# Patient Record
Sex: Female | Born: 1935 | Race: White | Hispanic: No | Smoking: Never smoker
Health system: Southern US, Community
[De-identification: ages and names within clinical notes are randomized; demographics above are authoritative.]

## PROBLEM LIST (undated history)

## (undated) DIAGNOSIS — K219 Gastro-esophageal reflux disease without esophagitis: Secondary | ICD-10-CM

## (undated) DIAGNOSIS — C801 Malignant (primary) neoplasm, unspecified: Secondary | ICD-10-CM

## (undated) DIAGNOSIS — F329 Major depressive disorder, single episode, unspecified: Secondary | ICD-10-CM

## (undated) DIAGNOSIS — J690 Pneumonitis due to inhalation of food and vomit: Secondary | ICD-10-CM

## (undated) DIAGNOSIS — I509 Heart failure, unspecified: Secondary | ICD-10-CM

## (undated) DIAGNOSIS — N390 Urinary tract infection, site not specified: Secondary | ICD-10-CM

## (undated) DIAGNOSIS — F32A Depression, unspecified: Secondary | ICD-10-CM

## (undated) DIAGNOSIS — C50919 Malignant neoplasm of unspecified site of unspecified female breast: Secondary | ICD-10-CM

## (undated) DIAGNOSIS — E785 Hyperlipidemia, unspecified: Secondary | ICD-10-CM

## (undated) DIAGNOSIS — E119 Type 2 diabetes mellitus without complications: Secondary | ICD-10-CM

## (undated) DIAGNOSIS — I10 Essential (primary) hypertension: Secondary | ICD-10-CM

## (undated) HISTORY — DX: Type 2 diabetes mellitus without complications: E11.9

## (undated) HISTORY — DX: Hyperlipidemia, unspecified: E78.5

## (undated) HISTORY — DX: Essential (primary) hypertension: I10

## (undated) HISTORY — DX: Depression, unspecified: F32.A

## (undated) HISTORY — DX: Major depressive disorder, single episode, unspecified: F32.9

## (undated) HISTORY — PX: KIDNEY STONE SURGERY: SHX686

## (undated) HISTORY — DX: Gastro-esophageal reflux disease without esophagitis: K21.9

## (undated) HISTORY — DX: Malignant neoplasm of unspecified site of unspecified female breast: C50.919

## (undated) HISTORY — PX: MASTECTOMY: SHX3

## (undated) HISTORY — PX: BREAST SURGERY: SHX581

## (undated) HISTORY — PX: CARDIAC VALVE REPLACEMENT: SHX585

## (undated) HISTORY — PX: WISDOM TOOTH EXTRACTION: SHX21

## (undated) HISTORY — DX: Urinary tract infection, site not specified: N39.0

## (undated) HISTORY — DX: Heart failure, unspecified: I50.9

## (undated) HISTORY — DX: Malignant (primary) neoplasm, unspecified: C80.1

## (undated) HISTORY — PX: BREAST BIOPSY: SHX20

---

## 2000-08-07 DIAGNOSIS — C50919 Malignant neoplasm of unspecified site of unspecified female breast: Secondary | ICD-10-CM

## 2000-08-07 HISTORY — DX: Malignant neoplasm of unspecified site of unspecified female breast: C50.919

## 2014-12-26 DIAGNOSIS — E1159 Type 2 diabetes mellitus with other circulatory complications: Secondary | ICD-10-CM | POA: Insufficient documentation

## 2015-01-05 ENCOUNTER — Encounter: Payer: Self-pay | Admitting: Internal Medicine

## 2015-01-05 ENCOUNTER — Non-Acute Institutional Stay (SKILLED_NURSING_FACILITY): Payer: Medicare Other | Admitting: Internal Medicine

## 2015-01-05 DIAGNOSIS — F329 Major depressive disorder, single episode, unspecified: Secondary | ICD-10-CM

## 2015-01-05 DIAGNOSIS — N390 Urinary tract infection, site not specified: Secondary | ICD-10-CM | POA: Insufficient documentation

## 2015-01-05 DIAGNOSIS — R296 Repeated falls: Secondary | ICD-10-CM | POA: Diagnosis not present

## 2015-01-05 DIAGNOSIS — B372 Candidiasis of skin and nail: Secondary | ICD-10-CM | POA: Insufficient documentation

## 2015-01-05 DIAGNOSIS — C50919 Malignant neoplasm of unspecified site of unspecified female breast: Secondary | ICD-10-CM | POA: Insufficient documentation

## 2015-01-05 DIAGNOSIS — R42 Dizziness and giddiness: Secondary | ICD-10-CM

## 2015-01-05 DIAGNOSIS — E785 Hyperlipidemia, unspecified: Secondary | ICD-10-CM

## 2015-01-05 DIAGNOSIS — F32A Depression, unspecified: Secondary | ICD-10-CM

## 2015-01-05 DIAGNOSIS — I10 Essential (primary) hypertension: Secondary | ICD-10-CM | POA: Diagnosis not present

## 2015-01-05 DIAGNOSIS — E119 Type 2 diabetes mellitus without complications: Secondary | ICD-10-CM

## 2015-01-05 NOTE — Assessment & Plan Note (Signed)
No info known other than above;pt on arimidex

## 2015-01-05 NOTE — Assessment & Plan Note (Signed)
Was w/u at [facility] CT head-all neg and resolved

## 2015-01-05 NOTE — Progress Notes (Signed)
MRN: [id number] Name: Vickie Murphy  Sex: female Age: 81 y.o. DOB: February 16, 1933  [place] [facility]: [name] Facility/Room:103 Level Of Care: SNF Provider: Inocencio Homes D Emergency Contacts: No emergency contact information on file.  Code Status: DNR  Allergies: Review of patient's allergies indicates not on file.  Chief Complaint  Patient presents with  . New Admit To SNF    HPI: Patient is 81 y.o. female who is admitted to [facility] for generalized weakness after being hosp for UTI.  Past Medical History  Diagnosis Date  . Diabetes mellitus without complication   . Depression   . Hypertension   . Recurrent UTI   . Cancer     breast-s/p mastectomy and on chemo  . Hyperlipidemia     Past Surgical History  Procedure Laterality Date  . Cardiac valve replacement      aortic, years ago-on no anticoagulation      Medication List       This list is accurate as of: 01/05/15  2:28 PM.  Always use your most recent med list.               anastrozole 1 MG tablet  Commonly known as:  ARIMIDEX  Take 1 mg by mouth daily.     atorvastatin 10 MG tablet  Commonly known as:  LIPITOR  Take 10 mg by mouth daily.     Calcium Carb-Cholecalciferol 600-400 MG-UNIT Caps  Take 1 capsule by mouth 2 (two) times daily.     ciprofloxacin 250 MG tablet  Commonly known as:  CIPRO  Take 250 mg by mouth 2 (two) times daily. For 10 days     Cyanocobalamin 1000 MCG/15ML Liqd  Take 1,000 mcg by mouth daily.     diltiazem 120 MG 24 hr capsule  Commonly known as:  CARDIZEM CD  Take 120 mg by mouth daily.     docusate sodium 100 MG capsule  Commonly known as:  COLACE  Take 100 mg by mouth daily as needed for mild constipation.     ENLYTE PO  Take 1 capsule by mouth daily.     FLUoxetine 40 MG capsule  Commonly known as:  PROZAC  Take 40 mg by mouth daily.     gabapentin 300 MG capsule  Commonly known as:  NEURONTIN  Take 300 mg by mouth 3 (three) times daily.     insulin detemir 100  UNIT/ML injection  Commonly known as:  LEVEMIR  Inject 35 Units into the skin at bedtime.     insulin lispro 100 UNIT/ML injection  Commonly known as:  HUMALOG  Inject into the skin 3 (three) times daily before meals. SSI ac     magnesium oxide 400 MG tablet  Commonly known as:  MAG-OX  Take 400 mg by mouth daily.     metFORMIN 500 MG tablet  Commonly known as:  GLUCOPHAGE  Take 500 mg by mouth 2 (two) times daily with a meal.     metoprolol succinate 25 MG 24 hr tablet  Commonly known as:  TOPROL-XL  Take 25 mg by mouth daily.     mirtazapine 7.5 MG tablet  Commonly known as:  REMERON  Take 7.5 mg by mouth at bedtime.     nystatin cream  Commonly known as:  MYCOSTATIN  Apply 1 application topically 2 (two) times daily.     nystatin powder  Commonly known as:  MYCOSTATIN  Apply topically.        Meds ordered this encounter  Medications  . ciprofloxacin (CIPRO) 250 MG tablet    Sig: Take 250 mg by mouth 2 (two) times daily. For 10 days  . gabapentin (NEURONTIN) 300 MG capsule    Sig: Take 300 mg by mouth 3 (three) times daily.  . insulin detemir (LEVEMIR) 100 UNIT/ML injection    Sig: Inject 35 Units into the skin at bedtime.  . metFORMIN (GLUCOPHAGE) 500 MG tablet    Sig: Take 500 mg by mouth 2 (two) times daily with a meal.  . nystatin cream (MYCOSTATIN)    Sig: Apply 1 application topically 2 (two) times daily.  Marland Kitchen nystatin (MYCOSTATIN) powder    Sig: Apply topically.  Marland Kitchen anastrozole (ARIMIDEX) 1 MG tablet    Sig: Take 1 mg by mouth daily.  Marland Kitchen atorvastatin (LIPITOR) 10 MG tablet    Sig: Take 10 mg by mouth daily.  . Calcium Carb-Cholecalciferol 600-400 MG-UNIT CAPS    Sig: Take 1 capsule by mouth 2 (two) times daily.  Marland Kitchen diltiazem (CARDIZEM CD) 120 MG 24 hr capsule    Sig: Take 120 mg by mouth daily.  . Cyanocobalamin 1000 MCG/15ML LIQD    Sig: Take 1,000 mcg by mouth daily.  Marland Kitchen docusate sodium (COLACE) 100 MG capsule    Sig: Take 100 mg by mouth daily as  needed for mild constipation.  . Dietary Management Product (ENLYTE PO)    Sig: Take 1 capsule by mouth daily.  Marland Kitchen FLUoxetine (PROZAC) 40 MG capsule    Sig: Take 40 mg by mouth daily.  . insulin lispro (HUMALOG) 100 UNIT/ML injection    Sig: Inject into the skin 3 (three) times daily before meals. SSI ac  . magnesium oxide (MAG-OX) 400 MG tablet    Sig: Take 400 mg by mouth daily.  . metoprolol succinate (TOPROL-XL) 25 MG 24 hr tablet    Sig: Take 25 mg by mouth daily.  . mirtazapine (REMERON) 7.5 MG tablet    Sig: Take 7.5 mg by mouth at bedtime.     There is no immunization history on file for this patient.  History  Substance Use Topics  . Smoking status: Never Smoker   . Smokeless tobacco: Not on file  . Alcohol Use: No    Family history is noncontributory    Review of Systems  DATA OBTAINED: from patient, nurse, medical record GENERAL:  no fevers, fatigue, appetite changes SKIN: yeast under pannus EYES: No eye pain, redness, discharge EARS: No earache, tinnitus, change in hearing NOSE: No congestion, drainage or bleeding  MOUTH/THROAT: No mouth or tooth pain, No sore throat RESPIRATORY: No cough, wheezing, SOB CARDIAC: No chest pain, palpitations, lower extremity edema  GI: No abdominal pain, No N/V/D or constipation, No heartburn or reflux  GU: No dysuria, frequency or urgency, or incontinence  MUSCULOSKELETAL: No unrelieved bone/joint pain NEUROLOGIC: No headache, dizziness or focal weakness PSYCHIATRIC: No overt anxiety or sadness, No behavior issue.   Filed Vitals:   01/05/15 1351  BP: 116/59  Pulse: 74  Temp: 97 F (36.1 C)  Resp: 18    Physical Exam  GENERAL APPEARANCE: Alert, conversant,  No acute distress.  SKIN: No diaphoresis  HEAD: Normocephalic, atraumatic  EYES: Conjunctiva/lids clear. Pupils round, reactive. EOMs intact.  EARS: External exam WNL, canals clear. Hearing grossly normal.  NOSE: No deformity or discharge.  MOUTH/THROAT: Lips  w/o lesions  RESPIRATORY: Breathing is even, unlabored. Lung sounds are clear   CARDIOVASCULAR: Heart RRR no murmurs, rubs or gallops. No peripheral edema.  GASTROINTESTINAL: Abdomen is soft, non-tender, not distended w/ normal bowel sounds. GENITOURINARY: Bladder non tender, not distended  MUSCULOSKELETAL: No abnormal joints or musculature NEUROLOGIC:  Cranial nerves 2-12 grossly intact. Moves all extremities  PSYCHIATRIC: Mood and affect appropriate to situation, no behavioral issues  Patient Active Problem List   Diagnosis Date Noted  . Falls frequently 01/05/2015  . Dizziness 01/05/2015  . Candidal intertrigo 01/05/2015  . Diabetes mellitus without complication   . Depression   . Hypertension   . Recurrent UTI   . Cancer of breast   . Hyperlipidemia     CBC No results found for: WBC, RBC, HGB, HCT, PLT, MCV, LYMPHSABS, MONOABS, EOSABS, BASOSABS  CMP  No results found for: NA, K, CL, CO2, GLUCOSE, BUN, CREATININE, CALCIUM, PROT, ALBUMIN, AST, ALT, ALKPHOS, BILITOT, GFRNONAA, GFRAA  Assessment and Plan  Recurrent UTI Presentation to [facility] - grew out polymorph; pt tx with rocephin for week and cipro as propylaxis   Depression Was hosp [facility] several weeks priot to admission and sx reported to be better;on prozac and remeron   Candidal intertrigo Nystatin cream and powder   Hypertension Continue cardizem and toprol as per home   Diabetes mellitus without complication Pt not stated as un controlled on detemir, lispro SS and glucophage 500 mg BID- cr 0.8   Hyperlipidemia Cont lipitor 10 mg   Dizziness Was w/u at hospital inc CT head-all neg and resolved   Cancer of breast No info known other than above;pt on arimidex     [name], MD

## 2015-01-05 NOTE — Assessment & Plan Note (Signed)
Continue cardizem and toprol as per home

## 2015-01-05 NOTE — Assessment & Plan Note (Signed)
Cont lipitor 10 mg

## 2015-01-05 NOTE — Assessment & Plan Note (Addendum)
Was hosp [facility] several weeks priot to admission and sx reported to be better;on prozac and remeron

## 2015-01-05 NOTE — Assessment & Plan Note (Addendum)
Pt not stated as un controlled on detemir, lispro SS and glucophage 500 mg BID- cr 0.8

## 2015-01-05 NOTE — Assessment & Plan Note (Signed)
Nystatin cream and powder

## 2015-01-05 NOTE — Assessment & Plan Note (Signed)
Presentation to [facility] - grew out polymorph; pt tx with rocephin for week and cipro as propylaxis

## 2015-01-19 ENCOUNTER — Non-Acute Institutional Stay (SKILLED_NURSING_FACILITY): Payer: Medicare Other | Admitting: Internal Medicine

## 2015-01-19 ENCOUNTER — Other Ambulatory Visit: Payer: Self-pay | Admitting: Internal Medicine

## 2015-01-19 ENCOUNTER — Encounter: Payer: Self-pay | Admitting: Internal Medicine

## 2015-01-19 DIAGNOSIS — R296 Repeated falls: Secondary | ICD-10-CM | POA: Diagnosis not present

## 2015-01-19 DIAGNOSIS — E785 Hyperlipidemia, unspecified: Secondary | ICD-10-CM | POA: Diagnosis not present

## 2015-01-19 DIAGNOSIS — I10 Essential (primary) hypertension: Secondary | ICD-10-CM | POA: Diagnosis not present

## 2015-01-19 DIAGNOSIS — F329 Major depressive disorder, single episode, unspecified: Secondary | ICD-10-CM | POA: Diagnosis not present

## 2015-01-19 DIAGNOSIS — B372 Candidiasis of skin and nail: Secondary | ICD-10-CM | POA: Diagnosis not present

## 2015-01-19 DIAGNOSIS — N390 Urinary tract infection, site not specified: Secondary | ICD-10-CM | POA: Diagnosis not present

## 2015-01-19 DIAGNOSIS — E119 Type 2 diabetes mellitus without complications: Secondary | ICD-10-CM | POA: Diagnosis not present

## 2015-01-19 DIAGNOSIS — F32A Depression, unspecified: Secondary | ICD-10-CM

## 2015-01-19 DIAGNOSIS — C50911 Malignant neoplasm of unspecified site of right female breast: Secondary | ICD-10-CM

## 2015-01-19 NOTE — Progress Notes (Signed)
MRN: [id number] Name: Vickie Murphy  Sex: female Age: 81 y.o. DOB: 09-08-1932  [place] #: [name] Facility/Room:103 Level Of Care: SNF Provider: Inocencio Homes D Emergency Contacts: Extended Emergency Contact Information Primary Emergency Contact: [name],[name] Address: [address]          [place], [place] [address] [place] Phone: [phone] Relation: Friend Secondary Emergency Contact: [name],[name]          [place], [place] Mobile Phone: [phone] Relation: Daughter  Code Status:DNR   Allergies: Review of patient's allergies indicates no known allergies.  Chief Complaint  Patient presents with  . Discharge Note    HPI: Patient is 81 y.o. female who was admitted to SNF for generalized weakness after a UTI who is now ready to go home  Past Medical History  Diagnosis Date  . Diabetes mellitus without complication   . Depression   . Hypertension   . Recurrent UTI   . Cancer     breast-s/p mastectomy and on chemo  . Hyperlipidemia     Past Surgical History  Procedure Laterality Date  . Cardiac valve replacement      aortic, years ago-on no anticoagulation      Medication List       This list is accurate as of: 01/19/15 12:38 PM.  Always use your most recent med list.               anastrozole 1 MG tablet  Commonly known as:  ARIMIDEX  Take 1 mg by mouth daily.     atorvastatin 10 MG tablet  Commonly known as:  LIPITOR  Take 10 mg by mouth daily.     Calcium Carb-Cholecalciferol 600-400 MG-UNIT Caps  Take 1 capsule by mouth 2 (two) times daily.     Cyanocobalamin 1000 MCG/15ML Liqd  Take 1,000 mcg by mouth daily.     diltiazem 120 MG 24 hr capsule  Commonly known as:  CARDIZEM CD  Take 120 mg by mouth daily.     docusate sodium 100 MG capsule  Commonly known as:  COLACE  Take 100 mg by mouth daily as needed for mild constipation.     ENLYTE PO  Take 1 capsule by mouth daily.     FLUoxetine 40 MG  capsule  Commonly known as:  PROZAC  Take 40 mg by mouth daily.     gabapentin 300 MG capsule  Commonly known as:  NEURONTIN  Take 300 mg by mouth 3 (three) times daily.     insulin detemir 100 UNIT/ML injection  Commonly known as:  LEVEMIR  Inject 35 Units into the skin at bedtime.     insulin lispro 100 UNIT/ML injection  Commonly known as:  HUMALOG  Inject 16 Units into the skin 3 (three) times daily before meals. SSI ac for BS > 200     magnesium oxide 400 MG tablet  Commonly known as:  MAG-OX  Take 400 mg by mouth daily.     metFORMIN 500 MG tablet  Commonly known as:  GLUCOPHAGE  Take 500 mg by mouth 2 (two) times daily with a meal.     metoprolol succinate 25 MG 24 hr tablet  Commonly known as:  TOPROL-XL  Take 25 mg by mouth daily.     mirtazapine 7.5 MG tablet  Commonly known as:  REMERON  Take 7.5 mg by mouth at bedtime.     nystatin cream  Commonly known as:  MYCOSTATIN  Apply 1 application topically 2 (two) times daily.     nystatin powder  Commonly known as:  MYCOSTATIN  Apply topically.        No orders of the defined types were placed in this encounter.     There is no immunization history on file for this patient.  History  Substance Use Topics  . Smoking status: Never Smoker   . Smokeless tobacco: Not on file  . Alcohol Use: No    Filed Vitals:   01/19/15 1220  BP: 116/72  Pulse: 85  Temp: 97.1 F (36.2 C)  Resp: 18    Physical Exam  GENERAL APPEARANCE: Alert, conversant. No acute distress.  HEENT: Unremarkable. RESPIRATORY: Breathing is even, unlabored. Lung sounds are clear   CARDIOVASCULAR: Heart RRR no murmurs, rubs or gallops. No peripheral edema.  GASTROINTESTINAL: Abdomen is soft, non-tender, not distended w/ normal bowel sounds.  NEUROLOGIC: Cranial nerves 2-12 grossly intact. Moves all extremities  Patient Active Problem List   Diagnosis Date Noted  . Falls frequently 01/05/2015  . Dizziness 01/05/2015  . Candidal  intertrigo 01/05/2015  . Diabetes mellitus without complication   . Depression   . Hypertension   . Recurrent UTI   . Cancer of breast   . Hyperlipidemia         Assessment and Plan  Pt is stable for d/c to home with HH/OT/PT and nursing.Pt has candida intertrigo in abdominal folds being treated with nystatin cream and powder.  [name], MD

## 2015-02-15 ENCOUNTER — Other Ambulatory Visit: Payer: Self-pay | Admitting: Internal Medicine

## 2015-02-18 ENCOUNTER — Other Ambulatory Visit: Payer: Self-pay | Admitting: Internal Medicine

## 2015-03-18 ENCOUNTER — Other Ambulatory Visit: Payer: Self-pay | Admitting: Internal Medicine

## 2015-03-29 ENCOUNTER — Other Ambulatory Visit: Payer: Self-pay | Admitting: Internal Medicine

## 2015-05-27 ENCOUNTER — Non-Acute Institutional Stay (SKILLED_NURSING_FACILITY): Payer: Medicare Other | Admitting: Internal Medicine

## 2015-05-27 DIAGNOSIS — F329 Major depressive disorder, single episode, unspecified: Secondary | ICD-10-CM

## 2015-05-27 DIAGNOSIS — E119 Type 2 diabetes mellitus without complications: Secondary | ICD-10-CM | POA: Diagnosis not present

## 2015-05-27 DIAGNOSIS — N390 Urinary tract infection, site not specified: Secondary | ICD-10-CM | POA: Diagnosis not present

## 2015-05-27 DIAGNOSIS — I10 Essential (primary) hypertension: Secondary | ICD-10-CM | POA: Diagnosis not present

## 2015-05-27 DIAGNOSIS — F32A Depression, unspecified: Secondary | ICD-10-CM

## 2015-05-27 NOTE — Progress Notes (Signed)
Patient ID: Vickie Murphy, female   DOB: [birth year], 81 y.o.   MRN: [id number]  intertrigo  This is an acute visit.  Level care skilled.  [facility].   chief complaint-acute visit status post hospitalization for UTI with recurrent infections.  History of present illness.   patient is a pleasant 81 year old female presented to the ER with complaints of generalized weakness - with decreased by mouth intake nausea.   she was admitted to the hospital UTI in a nonhealing right groin ulcer -she initially had IV antibiotics - urine was sent for culture.   -Regards to nonhealing right groin ulcer she had a biopsy done about 10 days before hospital admission.  Initially her urine culture grew out Citrobacter was sensitive to Levaquin she was also started on Diflucan as well as vancomycin for the right groin wound growing gram-positive bacilli.   Patient also was treated for chronic Candida intertrigo   dermatology has ecommended  Domeboros wet-to-dry for her groin wound.   She is also on Nizoral cream.   Regards to UTI she continues on Levaquin this is also apparently for the groin wound   previous medical history.  UTI.   chronic groin wound.   type 2 diabetes.  Hypertension.   History of candidiasis.   Depression.   surgical history.  Previous history of valve replacement.  Social history-she did live in an independent living  apartment-she is a widow.  No history of smoing alcohol or illicit drug use.   family history no pertinent family history.   medications.   Nizoral cream twice a day.  Levaquin 500 mg for total of 10 days.  Magnesium oxide 400 mg twice a day.  Remeron 15 mg daily.   Cartia XT 120 mg daily.   vitamin B12 thousand micrograms daily.  Prozac 40 mg daily.  Folic acid 40 mg daily.  Neurontin 300 mg 3 times a day.  Imodium 4 times a day when necessary.  Glucophage 500 mg 3 times a day.   Toprol-XL 5 mg daily.  Multivitamin  daily.   trazodone 50 mg daily at bedtime.  Vitamin C thousand milligrams daily.  Review of systems.   in general does not complain of any fever or chills.  Skin again does have issues as noted above including a groin wound and history candidiasis.  Eyes does not complain of any visual changes for eye pain.  Ear nose mouth and throat does not complain a sore throat or nasal discharge.   Respiratory not complaining of shortness breath is at times in the afternoon she will get a bit short of breath that is very transitory she says this is not new currently does not have shortness of breath.   Cardiac does not complaining chest pain or palpitations.   GI does not complain of nausea vomiting diarrhea or constipation.  GU is not complaining of dysuria currently being treated for UTI.  Muscle skeletal has weakness but does not complaining of joint pain.  Neurologic not complaining of dizziness or headache appears have some history of neuropathy on Neurontin.   Psych history depression  Does not complain of feeling sad or depressed currently.  Physical exam.  Temperature is 97.8 pulse 86 respirations 20 blood pressure 117/77.   In general this is a very pleasant elderly female in no distress lying comfortably in bed.  Her skin is warm and dry she does have dressing over her groin wound cream applied to the groin area.  Eyes pupils  appear reactive light visual acuity appears grossly intact sclera and conjunctivae clear.   Oropharynx mucous membranes are  Moist throat is clear.  Chest is clear to auscultation no labored breathing.  Heart is regular rate and rhythm without murmur gallop or rub she does not have significant lower extremity edema pedal pulses are intact.  Abdomen soft nontender positive bowel sounds.  Muscle skeletal has generalized weakness but I do not note any deformities is able to move all her extremities 4.  Neurologic is grossly intact no lateralizing  findings-- Her speech is clear.  Psych she is alert and oriented very pleasant and appropriate.  Labs.   05/24/2015.    Sodium 136 potassium 4.3 BUN 24 creatinine 0.84.  WBC 8.3 hemoglobin 10.8 platelets 2:30.   assessment and plan.   History of UTI Citro Dr. She is completing a ten-day course of Levaquin at this point appears to be stable she is afebrile continue to monitor   #2 history of chronic groin wound-again this has been biopsied recommendation forDomeboros-wet-to-dry compresses by dermatology she also continus on Levaquin.   she will be followed by wound care here as well.  Also recommendation for Nizoral cream to her area with history of chronic candidiasis.   #3 hypertension-this appears to be stable continue to monitor blood pressure she is  Cartia  120  Milligrams per day.   #4- depression this appears to be quite stable at this point she is on Prozac as well as Remeron-this may be for appetite stimulation apparently her appetite was quite poor previously.   #5 history oof diabetes type 2 continues on Glucophage blood sugars her chart review were stable in hospital at this point monitor she appears to be doing well with this.   History of diarrhea as an outpatient previously she is on Imodium as needed is not complaining of diarrhea this evening.   will update a CBC and metabolic panel first lab next week.   [id number]- of note greater than 40 minutes spent assessing patient reviewing her chart-and coordinating and formulating a plan of care for numerous diagnoses- of note greater than 50% of time spent coordinating plan of care         .

## 2015-06-01 ENCOUNTER — Non-Acute Institutional Stay (SKILLED_NURSING_FACILITY): Payer: Medicare Other | Admitting: Internal Medicine

## 2015-06-01 ENCOUNTER — Encounter: Payer: Self-pay | Admitting: Internal Medicine

## 2015-06-01 DIAGNOSIS — R296 Repeated falls: Secondary | ICD-10-CM

## 2015-06-01 DIAGNOSIS — I10 Essential (primary) hypertension: Secondary | ICD-10-CM | POA: Diagnosis not present

## 2015-06-01 DIAGNOSIS — E119 Type 2 diabetes mellitus without complications: Secondary | ICD-10-CM

## 2015-06-01 DIAGNOSIS — L98499 Non-pressure chronic ulcer of skin of other sites with unspecified severity: Secondary | ICD-10-CM | POA: Diagnosis not present

## 2015-06-01 DIAGNOSIS — F32A Depression, unspecified: Secondary | ICD-10-CM

## 2015-06-01 DIAGNOSIS — N390 Urinary tract infection, site not specified: Secondary | ICD-10-CM | POA: Diagnosis not present

## 2015-06-01 DIAGNOSIS — B372 Candidiasis of skin and nail: Secondary | ICD-10-CM

## 2015-06-01 DIAGNOSIS — F329 Major depressive disorder, single episode, unspecified: Secondary | ICD-10-CM

## 2015-06-01 NOTE — Progress Notes (Signed)
MRN: [id number] Name: Athira Janowicz  Sex: female Age: 81 y.o. DOB: 02/17/1933  [place] [facility]: [name] Facility/Room:103 Level Of Care: SNF Provider: Inocencio Homes D Emergency Contacts: Extended Emergency Contact Information Primary Emergency Contact: [name],[name] Address: [address]          [place], [place] [address] [place] Phone: [phone] Relation: Friend Secondary Emergency Contact: [name],[name]          [place], [place] Mobile Phone: [phone] Relation: Daughter  Code Status:   Allergies: Review of patient's allergies indicates no known allergies.  Chief Complaint  Patient presents with  . New Admit To SNF    HPI: Patient is 81 y.o. female with DM,HTN, depression, frequent falls, recurrent UTI's who was admitted to [facility] 10/12-19 for a UTI and infected R groin ulcer. Pt is being admitted to SNF for generalized weakness and frequent falls and to complete abx course. While at SNF pt wil be followed for HTN, tx with metoprolol and diltiazem, chronic candida intertrigo, tx with nizoral and depression tx with prozac and remeron.  Past Medical History  Diagnosis Date  . Diabetes mellitus without complication ([place])   . Depression   . Hypertension   . Recurrent UTI   . Cancer [facility])     breast-s/p mastectomy and on chemo  . Hyperlipidemia     Past Surgical History  Procedure Laterality Date  . Cardiac valve replacement      aortic, years ago-on no anticoagulation      Medication List       This list is accurate as of: 06/01/15 11:59 PM.  Always use your most recent med list.               anastrozole 1 MG tablet  Commonly known as:  ARIMIDEX  Take 1 mg by mouth daily.     atorvastatin 10 MG tablet  Commonly known as:  LIPITOR  Take 10 mg by mouth daily.     Calcium Carb-Cholecalciferol 600-400 MG-UNIT Caps  Take 1 capsule by mouth 2 (two) times daily.     Cyanocobalamin 1000 MCG/15ML Liqd  Take 1,000 mcg by  mouth daily.     diltiazem 120 MG 24 hr capsule  Commonly known as:  CARDIZEM CD  Take 120 mg by mouth daily.     FLUoxetine 40 MG capsule  Commonly known as:  PROZAC  Take 40 mg by mouth daily.     folic acid 016 MCG tablet  Commonly known as:  FOLVITE  Take 400 mcg by mouth daily.     gabapentin 300 MG capsule  Commonly known as:  NEURONTIN  Take 300 mg by mouth 3 (three) times daily.     insulin detemir 100 UNIT/ML injection  Commonly known as:  LEVEMIR  Inject 35 Units into the skin at bedtime.     insulin lispro 100 UNIT/ML injection  Commonly known as:  HUMALOG  Inject 16 Units into the skin 3 (three) times daily before meals. SSI ac for BS > 200     ketoconazole 2 % cream  Commonly known as:  NIZORAL  Apply 1 application topically 2 (two) times daily.     levofloxacin 500 MG tablet  Commonly known as:  LEVAQUIN  Take 500 mg by mouth daily. For 10 more days     magnesium oxide 400 MG tablet  Commonly known as:  MAG-OX  Take 400 mg by mouth 2 (two) times daily.  metFORMIN 500 MG tablet  Commonly known as:  GLUCOPHAGE  Take 500 mg by mouth 2 (two) times daily with a meal.     metoprolol succinate 25 MG 24 hr tablet  Commonly known as:  TOPROL-XL  Take 25 mg by mouth daily.     mirtazapine 7.5 MG tablet  Commonly known as:  REMERON  Take 15 mg by mouth at bedtime.     multivitamin capsule  Take 1 capsule by mouth daily.     traZODone 50 MG tablet  Commonly known as:  DESYREL  Take 50 mg by mouth at bedtime.     vitamin C 1000 MG tablet  Take 1,000 mg by mouth daily.        Meds ordered this encounter  Medications  . ketoconazole (NIZORAL) 2 % cream    Sig: Apply 1 application topically 2 (two) times daily.  Marland Kitchen levofloxacin (LEVAQUIN) 500 MG tablet    Sig: Take 500 mg by mouth daily. For 10 more days  . Multiple Vitamin (MULTIVITAMIN) capsule    Sig: Take 1 capsule by mouth daily.  . traZODone (DESYREL) 50 MG tablet    Sig: Take 50 mg by  mouth at bedtime.  . Ascorbic Acid (VITAMIN C) 1000 MG tablet    Sig: Take 1,000 mg by mouth daily.  . folic acid (FOLVITE) 716 MCG tablet    Sig: Take 400 mcg by mouth daily.     There is no immunization history on file for this patient.  Social History  Substance Use Topics  . Smoking status: Never Smoker   . Smokeless tobacco: Not on file  . Alcohol Use: No    Family history is  + HTN  Review of Systems  DATA OBTAINED: from patient, nurse- no c/o or concerns GENERAL:  no fevers, fatigue, appetite changes SKIN: No itching, rash or wounds EYES: No eye pain, redness, discharge EARS: No earache, tinnitus, change in hearing NOSE: No congestion, drainage or bleeding  MOUTH/THROAT: No mouth or tooth pain, No sore throat RESPIRATORY: No cough, wheezing, SOB CARDIAC: No chest pain, palpitations, lower extremity edema  GI: No abdominal pain, No N/V/D or constipation, No heartburn or reflux  GU: No dysuria, frequency or urgency, or incontinence  MUSCULOSKELETAL: No unrelieved bone/joint pain NEUROLOGIC: No headache, dizziness or focal weakness PSYCHIATRIC: No c/o anxiety or sadness   Filed Vitals:   06/01/15 1617  BP: 123/67  Pulse: 83  Temp: 97.3 F (36.3 C)  Resp: 20    SpO2 Readings from Last 1 Encounters:  No data found for SpO2        Physical Exam  GENERAL APPEARANCE: Alert, conversant,  Pleasant WF,No acute distress.  SKIN: No diaphoresis rash; dressing r groin HEAD: Normocephalic, atraumatic  EYES: Conjunctiva/lids clear. Pupils round, reactive. EOMs intact.  EARS: External exam WNL, canals clear. Hearing grossly normal.  NOSE: No deformity or discharge.  MOUTH/THROAT: Lips w/o lesions  RESPIRATORY: Breathing is even, unlabored. Lung sounds are clear   CARDIOVASCULAR: Heart RRR no murmurs, rubs or gallops. No peripheral edema.   GASTROINTESTINAL: Abdomen is soft, non-tender, not distended w/ normal bowel sounds. GENITOURINARY: Bladder non tender, not  distended  MUSCULOSKELETAL: No abnormal joints or musculature NEUROLOGIC:  Cranial nerves 2-12 grossly intact. Moves all extremities  PSYCHIATRIC: Mood and affect appropriate to situation, no behavioral issues  Patient Active Problem List   Diagnosis Date Noted  . Ulcer of right groin ([place]) 06/02/2015  . Falls frequently 01/05/2015  . Dizziness 01/05/2015  .  Candidal intertrigo 01/05/2015  . Diabetes mellitus without complication ([place])   . Depression   . Hypertension   . Recurrent UTI   . Cancer of breast ([place])   . Hyperlipidemia     CBC No results found for: WBC, RBC, HGB, HCT, PLT, MCV, LYMPHSABS, MONOABS, EOSABS, BASOSABS  CMP  No results found for: NA, K, CL, CO2, GLUCOSE, BUN, CREATININE, CALCIUM, PROT, ALBUMIN, AST, ALT, ALKPHOS, BILITOT, GFRNONAA, GFRAA  No results found for: HGBA1C  Not all labs, radiology exams or other studies done during hospitalization come through on my EPIC note; however they are reviewed by me.Outside hospital info is on paper and are reviewed by me.    Assessment and Plan  Recurrent UTI Pt was tx with rocephin and changed to levaquin when wound cx returned ; SNF - levaquin 500 mg for 10 more days  Ulcer of right groin ([place]) Non healing'bx taken at [facility] 2 weeks ago returned citrobacter sens to levaquin; SNF - wound care -d[place]boro wet to dry and levaquin for 10 more days  Candidal intertrigo SNF - chronic;has been changed to nizorale cream  Hypertension SNF - stable on metoprolol and diltiazem  Falls frequently SNF - OT/PT  Depression SNF - stable on prozac 40 mg, remeron 15 mg ;plan - cont  Diabetes mellitus without complication SNF - pt was on insulin prior but comes to [place] with only glucophage; not stated why in notes and pt doesn't know; no longer on statin   Time spent 45 min;> 50% of time with patient was spent reviewing records, labs, tests and studies, counseling and developing plan of care  [name],  MD

## 2015-06-02 DIAGNOSIS — L98499 Non-pressure chronic ulcer of skin of other sites with unspecified severity: Secondary | ICD-10-CM | POA: Insufficient documentation

## 2015-06-02 NOTE — Assessment & Plan Note (Signed)
SNF - OT/PT

## 2015-06-02 NOTE — Assessment & Plan Note (Signed)
SNF - stable on metoprolol and diltiazem

## 2015-06-02 NOTE — Assessment & Plan Note (Signed)
SNF - pt was on insulin prior but comes to [place] with only glucophage; not stated why in notes and pt doesn't know; no longer on statin

## 2015-06-02 NOTE — Assessment & Plan Note (Signed)
SNF - stable on prozac 40 mg, remeron 15 mg ;plan - cont

## 2015-06-02 NOTE — Assessment & Plan Note (Signed)
SNF - chronic;has been changed to nizorale cream

## 2015-06-02 NOTE — Assessment & Plan Note (Signed)
Non healing'bx taken at [facility] 2 weeks ago returned citrobacter sens to levaquin; SNF - wound care -domeboro wet to dry and levaquin for 10 more days

## 2015-06-02 NOTE — Assessment & Plan Note (Signed)
Pt was tx with rocephin and changed to levaquin when wound cx returned ; SNF - levaquin 500 mg for 10 more days

## 2015-06-04 ENCOUNTER — Non-Acute Institutional Stay (SKILLED_NURSING_FACILITY): Payer: Medicare Other | Admitting: Internal Medicine

## 2015-06-04 DIAGNOSIS — R0602 Shortness of breath: Secondary | ICD-10-CM | POA: Diagnosis not present

## 2015-06-05 ENCOUNTER — Encounter: Payer: Self-pay | Admitting: Internal Medicine

## 2015-06-07 ENCOUNTER — Encounter: Payer: Self-pay | Admitting: Internal Medicine

## 2015-06-07 DIAGNOSIS — R0602 Shortness of breath: Secondary | ICD-10-CM | POA: Insufficient documentation

## 2015-06-07 NOTE — Assessment & Plan Note (Signed)
After much further questioning and normal exam except for RR and anxious affect pt admits she is very upset because she is not going to be able to see her granddaughter before she joins the [employer] in 3 days. She became tearful and the daughter filled in the family dynamics; Dx -SOB 2/2 acute emotion and anxiety

## 2015-06-07 NOTE — Progress Notes (Signed)
MRN: [id number] Name: Vickie Murphy  Sex: female Age: 81 y.o. DOB: Oct 13, 1932  [facility] Facility/Room:103 Level Of Care: SNF Provider: Inocencio Homes D Emergency Contacts: Extended Emergency Contact Information Primary Emergency Contact: Vickie Murphy Address: [address]          [place], [place] [address] [place] Phone: [phone] Relation: Friend Secondary Emergency Contact: Vickie Murphy          [place], [place] Mobile Phone: [phone] Relation: Daughter  Code Status:   Allergies: Review of patient's allergies indicates no known allergies.  Chief Complaint  Patient presents with  . Acute Visit    HPI: Patient is 81 y.o. female with DM,HTN, depression, frequent falls, recurrent UTI's who was admitted to [facility] 10/12-19 for a UTI and infected R groin ulcer admitted to SNF for generalized weakness and abx tx who nursing has asked me to see now for sudden SOB. Pt's daughter is in the room and very worried. Pt is having no CP, nausea, arm pain, fever, cold, cough.  Past Medical History  Diagnosis Date  . Diabetes mellitus without complication ([place])   . Depression   . Hypertension   . Recurrent UTI   . Cancer [facility])     breast-s/p mastectomy and on chemo  . Hyperlipidemia     Past Surgical History  Procedure Laterality Date  . Cardiac valve replacement      aortic, years ago-on no anticoagulation      Medication List       This list is accurate as of: 06/04/15 11:59 PM.  Always use your most recent med list.               anastrozole 1 MG tablet  Commonly known as:  ARIMIDEX  Take 1 mg by mouth daily.     atorvastatin 10 MG tablet  Commonly known as:  LIPITOR  Take 10 mg by mouth daily.     Calcium Carb-Cholecalciferol 600-400 MG-UNIT Caps  Take 1 capsule by mouth 2 (two) times daily.     Cyanocobalamin 1000 MCG/15ML Liqd  Take 1,000 mcg by mouth daily.     diltiazem 120 MG 24 hr capsule   Commonly known as:  CARDIZEM CD  Take 120 mg by mouth daily.     FLUoxetine 40 MG capsule  Commonly known as:  PROZAC  Take 40 mg by mouth daily.     folic acid 242 MCG tablet  Commonly known as:  FOLVITE  Take 400 mcg by mouth daily.     gabapentin 300 MG capsule  Commonly known as:  NEURONTIN  Take 300 mg by mouth 3 (three) times daily.     insulin detemir 100 UNIT/ML injection  Commonly known as:  LEVEMIR  Inject 35 Units into the skin at bedtime.     insulin lispro 100 UNIT/ML injection  Commonly known as:  HUMALOG  Inject 16 Units into the skin 3 (three) times daily before meals. SSI ac for BS > 200     ketoconazole 2 % cream  Commonly known as:  NIZORAL  Apply 1 application topically 2 (two) times daily.     levofloxacin 500 MG tablet  Commonly known as:  LEVAQUIN  Take 500 mg by mouth daily. For 10 more days     magnesium oxide 400 MG tablet  Commonly known as:  MAG-OX  Take 400 mg by mouth 2 (two) times daily.     metFORMIN 500 MG tablet  Commonly known as:  GLUCOPHAGE  Take 500 mg by mouth 2 (two) times daily with a meal.     metoprolol succinate 25 MG 24 hr tablet  Commonly known as:  TOPROL-XL  Take 25 mg by mouth daily.     mirtazapine 7.5 MG tablet  Commonly known as:  REMERON  Take 15 mg by mouth at bedtime.     multivitamin capsule  Take 1 capsule by mouth daily.     traZODone 50 MG tablet  Commonly known as:  DESYREL  Take 50 mg by mouth at bedtime.     vitamin C 1000 MG tablet  Take 1,000 mg by mouth daily.        No orders of the defined types were placed in this encounter.     There is no immunization history on file for this patient.  Social History  Substance Use Topics  . Smoking status: Never Smoker   . Smokeless tobacco: Not on file  . Alcohol Use: No    Review of Systems  DATA OBTAINED: from patient, nurse, daughter GENERAL:  no fevers, fatigue, appetite changes SKIN: No itching, rash HEENT: No  complaint RESPIRATORY: No cough, wheezing, SOB CARDIAC: No chest pain, palpitations, lower extremity edema  GI: No abdominal pain, No N/V/D or constipation, No heartburn or reflux  GU: No dysuria, frequency or urgency, or incontinence  MUSCULOSKELETAL: No unrelieved bone/joint pain NEUROLOGIC: No headache, dizziness  PSYCHIATRIC: +anxiety and sadness  Filed Vitals:   06/07/15 1316  BP: 146/74  Pulse: 66  Temp: 98 F (36.7 C)  Resp: 18    Physical Exam  GENERAL APPEARANCE: Alert, conversant, No acute physical distress  SKIN: No diaphoresis rash,  HEENT: Unremarkable RESPIRATORY: Breathing is even, unlabored. Lung sounds are clear; O2 sat RA 95%   CARDIOVASCULAR: Heart RRR no murmurs, rubs or gallops. No peripheral edema  GASTROINTESTINAL: Abdomen is soft, non-tender, not distended w/ normal bowel sounds.  GENITOURINARY: Bladder non tender, not distended  MUSCULOSKELETAL: No abnormal joints or musculature NEUROLOGIC: Cranial nerves 2-12 grossly intact. Moves all extremities PSYCHIATRIC: pt appears emotionally upset, no behavioral issues  Patient Active Problem List   Diagnosis Date Noted  . SOB (shortness of breath) 06/07/2015  . Ulcer of right groin ([place]) 06/02/2015  . Falls frequently 01/05/2015  . Dizziness 01/05/2015  . Candidal intertrigo 01/05/2015  . Diabetes mellitus without complication ([place])   . Depression   . Hypertension   . Recurrent UTI   . Cancer of breast ([place])   . Hyperlipidemia         Assessment and Plan  SOB (shortness of breath) After much further questioning and normal exam except for RR and anxious affect pt admits she is very upset because she is not going to be able to see her granddaughter before she joins the [employer] in 3 days. She became tearful and the daughter filled in the family dynamics; Dx -SOB 2/2 acute emotion and anxiety   Time spent > 35 min Vickie Duos, MD

## 2015-06-22 ENCOUNTER — Non-Acute Institutional Stay (SKILLED_NURSING_FACILITY): Payer: Medicare Other | Admitting: Internal Medicine

## 2015-06-22 ENCOUNTER — Encounter: Payer: Self-pay | Admitting: Internal Medicine

## 2015-06-22 DIAGNOSIS — L98499 Non-pressure chronic ulcer of skin of other sites with unspecified severity: Secondary | ICD-10-CM | POA: Diagnosis not present

## 2015-06-22 DIAGNOSIS — E119 Type 2 diabetes mellitus without complications: Secondary | ICD-10-CM

## 2015-06-22 DIAGNOSIS — F32A Depression, unspecified: Secondary | ICD-10-CM

## 2015-06-22 DIAGNOSIS — L98479 Non-pressure chronic ulcer of groin with unspecified severity: Secondary | ICD-10-CM

## 2015-06-22 DIAGNOSIS — B372 Candidiasis of skin and nail: Secondary | ICD-10-CM | POA: Diagnosis not present

## 2015-06-22 DIAGNOSIS — F329 Major depressive disorder, single episode, unspecified: Secondary | ICD-10-CM

## 2015-06-22 DIAGNOSIS — N390 Urinary tract infection, site not specified: Secondary | ICD-10-CM | POA: Diagnosis not present

## 2015-06-22 DIAGNOSIS — I10 Essential (primary) hypertension: Secondary | ICD-10-CM | POA: Diagnosis not present

## 2015-06-22 DIAGNOSIS — E785 Hyperlipidemia, unspecified: Secondary | ICD-10-CM

## 2015-06-22 NOTE — Progress Notes (Signed)
MRN: [id number] Name: Vickie Murphy  Sex: female Age: 81 y.o. DOB: [birth year]  [place] [name] Facility/Room:103 Level Of Care: SNF Provider: Inocencio Homes D Emergency Contacts: Extended Emergency Contact Information Primary Emergency Contact: [name],[name] Address: [address]          [place], [place] [address] [place] Phone: [phone] Relation: Friend Secondary Emergency Contact: [name],[name]          [place], [place] Mobile Phone: [phone] Relation: Daughter  Code Status:   Allergies: Review of patient's allergies indicates no known allergies.  Chief Complaint  Patient presents with  . Discharge Note    HPI: Patient is 81 y.o. female with DM,HTN, depression, frequent falls, recurrent UTI's who was admitted to [facility] 10/12-19 for a UTI and infected R groin ulcer. Pt is being admitted to SNF for generalized weakness and frequent falls and to complete abx course. Pt is now ready to be d/c to [facility].  Past Medical History  Diagnosis Date  . Diabetes mellitus without complication ([place])   . Depression   . Hypertension   . Recurrent UTI   . Cancer [facility])     breast-s/p mastectomy and on chemo  . Hyperlipidemia     Past Surgical History  Procedure Laterality Date  . Cardiac valve replacement      aortic, years ago-on no anticoagulation      Medication List       This list is accurate as of: 06/22/15  3:53 PM.  Always use your most recent med list.               anastrozole 1 MG tablet  Commonly known as:  ARIMIDEX  Take 1 mg by mouth daily.     atorvastatin 10 MG tablet  Commonly known as:  LIPITOR  Take 10 mg by mouth daily.     Calcium Carb-Cholecalciferol 600-400 MG-UNIT Caps  Take 1 capsule by mouth 2 (two) times daily.     Cyanocobalamin 1000 MCG/15ML Liqd  Take 1,000 mcg by mouth daily.     diltiazem 120 MG 24 hr capsule  Commonly known as:  CARDIZEM CD  Take 120 mg by mouth daily.     FLUoxetine 40 MG capsule  Commonly known as:  PROZAC  Take 40 mg by mouth daily.     folic acid A999333 MCG tablet  Commonly known as:  FOLVITE  Take 400 mcg by mouth daily.     gabapentin 300 MG capsule  Commonly known as:  NEURONTIN  Take 300 mg by mouth 3 (three) times daily.     insulin detemir 100 UNIT/ML injection  Commonly known as:  LEVEMIR  Inject 35 Units into the skin at bedtime.     insulin lispro 100 UNIT/ML injection  Commonly known as:  HUMALOG  Inject 16 Units into the skin 3 (three) times daily before meals. SSI ac for BS > 200     ketoconazole 2 % cream  Commonly known as:  NIZORAL  Apply 1 application topically 2 (two) times daily.     levofloxacin 500 MG tablet  Commonly known as:  LEVAQUIN  Take 500 mg by mouth daily. For 10 more days     magnesium oxide 400 MG tablet  Commonly known as:  MAG-OX  Take 400 mg by mouth 2 (two) times daily.     metFORMIN 500 MG tablet  Commonly known as:  GLUCOPHAGE  Take 500 mg by mouth 2 (two)  times daily with a meal.     metoprolol succinate 25 MG 24 hr tablet  Commonly known as:  TOPROL-XL  Take 25 mg by mouth daily.     mirtazapine 7.5 MG tablet  Commonly known as:  REMERON  Take 15 mg by mouth at bedtime.     multivitamin capsule  Take 1 capsule by mouth daily.     traZODone 50 MG tablet  Commonly known as:  DESYREL  Take 50 mg by mouth at bedtime.     vitamin C 1000 MG tablet  Take 1,000 mg by mouth daily.        No orders of the defined types were placed in this encounter.     There is no immunization history on file for this patient.  Social History  Substance Use Topics  . Smoking status: Never Smoker   . Smokeless tobacco: Not on file  . Alcohol Use: No    Filed Vitals:   06/22/15 1551  BP: 133/64  Pulse: 60  Temp: 97.2 F (36.2 C)  Resp: 18    Physical Exam  GENERAL APPEARANCE: Alert, conversant. No acute distress.  HEENT: Unremarkable. RESPIRATORY: Breathing is even,  unlabored. Lung sounds are clear   CARDIOVASCULAR: Heart RRR no murmurs, rubs or gallops. No peripheral edema.  GASTROINTESTINAL: Abdomen is soft, non-tender, not distended w/ normal bowel sounds.  NEUROLOGIC: Cranial nerves 2-12 grossly intact. Moves all extremities  Patient Active Problem List   Diagnosis Date Noted  . SOB (shortness of breath) 06/07/2015  . Ulcer of right groin ([place]) 06/02/2015  . Falls frequently 01/05/2015  . Dizziness 01/05/2015  . Candidal intertrigo 01/05/2015  . Diabetes mellitus without complication ([place])   . Depression   . Hypertension   . Recurrent UTI   . Cancer of breast ([place])   . Hyperlipidemia       Assessment and Plan  Pt is d/c to [facility] with HH/OT/PT/nursing. Rx's have been written.   Time spent > 30 min;> 50% of time with patient was spent reviewing records, labs, tests and studies, counseling and developing plan of care   [name], MD

## 2015-06-23 ENCOUNTER — Other Ambulatory Visit: Payer: Self-pay | Admitting: Internal Medicine

## 2015-07-22 ENCOUNTER — Other Ambulatory Visit: Payer: Self-pay | Admitting: Internal Medicine

## 2015-07-23 ENCOUNTER — Other Ambulatory Visit: Payer: Self-pay | Admitting: Internal Medicine

## 2015-11-30 ENCOUNTER — Other Ambulatory Visit: Payer: Self-pay | Admitting: Internal Medicine

## 2015-12-14 LAB — BASIC METABOLIC PANEL
BUN: 20 mg/dL (ref 4–21)
CREATININE: 1.2 mg/dL — AB (ref 0.5–1.1)
Glucose: 262 mg/dL
POTASSIUM: 5.4 mmol/L — AB (ref 3.4–5.3)
SODIUM: 135 mmol/L — AB (ref 137–147)

## 2015-12-14 LAB — CBC AND DIFFERENTIAL
HCT: 32 % — AB (ref 36–46)
HEMOGLOBIN: 10.1 g/dL — AB (ref 12.0–16.0)
Platelets: 279 10*3/uL (ref 150–399)
WBC: 12.2 10*3/mL

## 2015-12-14 LAB — HEPATIC FUNCTION PANEL
ALK PHOS: 142 U/L — AB (ref 25–125)
ALT: 10 U/L (ref 7–35)
AST: 21 U/L (ref 13–35)
BILIRUBIN, TOTAL: 0.3 mg/dL

## 2015-12-18 LAB — CBC AND DIFFERENTIAL
HCT: 30 % — AB (ref 36–46)
Hemoglobin: 10 g/dL — AB (ref 12.0–16.0)
Platelets: 265 10*3/uL (ref 150–399)
WBC: 8.5 10*3/mL

## 2015-12-18 LAB — BASIC METABOLIC PANEL
BUN: 11 mg/dL (ref 4–21)
CREATININE: 0.8 mg/dL (ref 0.5–1.1)
Glucose: 163 mg/dL
POTASSIUM: 3.9 mmol/L (ref 3.4–5.3)
Sodium: 137 mmol/L (ref 137–147)

## 2015-12-21 ENCOUNTER — Non-Acute Institutional Stay (SKILLED_NURSING_FACILITY): Payer: Medicare Other | Admitting: Internal Medicine

## 2015-12-21 ENCOUNTER — Encounter: Payer: Self-pay | Admitting: Internal Medicine

## 2015-12-21 DIAGNOSIS — A419 Sepsis, unspecified organism: Secondary | ICD-10-CM | POA: Diagnosis not present

## 2015-12-21 DIAGNOSIS — E119 Type 2 diabetes mellitus without complications: Secondary | ICD-10-CM | POA: Diagnosis not present

## 2015-12-21 DIAGNOSIS — E785 Hyperlipidemia, unspecified: Secondary | ICD-10-CM | POA: Diagnosis not present

## 2015-12-21 DIAGNOSIS — N390 Urinary tract infection, site not specified: Secondary | ICD-10-CM

## 2015-12-21 DIAGNOSIS — J189 Pneumonia, unspecified organism: Secondary | ICD-10-CM

## 2015-12-21 DIAGNOSIS — I10 Essential (primary) hypertension: Secondary | ICD-10-CM | POA: Diagnosis not present

## 2015-12-21 DIAGNOSIS — F329 Major depressive disorder, single episode, unspecified: Secondary | ICD-10-CM

## 2015-12-21 DIAGNOSIS — F32A Depression, unspecified: Secondary | ICD-10-CM

## 2015-12-21 NOTE — Progress Notes (Signed)
MRN: [id number] Name: Vickie Murphy  Sex: female Age: 82 y.o. DOB: 01-Dec-1932  [place] #: [name] Facility/Room: 505 P Level Of Care: SNF Provider: Noah Delaine. Sheppard Coil, MD Emergency Contacts: Extended Emergency Contact Information Primary Emergency Contact: [name],[name] Address: [address]          [place], [place] [address] [place] Phone: [phone] Relation: Friend Secondary Emergency Contact: [name],[name]          [place], [place] Mobile Phone: [phone] Relation: Daughter  Code Status: DNR  Allergies: Review of patient's allergies indicates no known allergies.  Chief Complaint  Patient presents with  . New Admit To SNF    Admission to facility    HPI: Patient is 82 y.o. female with DM2, HTN, depression who was admitted to [facility] from 5/9- 15 after [presenting with several days of generalized weakness, productive cough for 2-3 weeks. Pt had also been being treated for a UTI as an outpt. Pt was dx with sepsis 2/2 to UTI and PNA , treated with broad spectrum antibiotics followed by levaquin. Hospital curse was complicated by mild hypotension so meds were held. Pt is admitted to SNF with generalized weakness for OT/PT. While at SNF pt will be followed for DM2, tx with glucophage, HLD, tx with lipitor and depression, tx with prozac.  Past Medical History  Diagnosis Date  . Diabetes mellitus without complication ([place])   . Depression   . Hypertension   . Recurrent UTI   . Cancer [facility])     breast-s/p mastectomy and on chemo  . Hyperlipidemia     Past Surgical History  Procedure Laterality Date  . Cardiac valve replacement      aortic, years ago-on no anticoagulation  . Breast surgery Right       Medication List       This list is accurate as of: 12/21/15 11:59 PM.  Always use your most recent med list.               Acidophilus Lactobacillus Caps  Take 1 capsule by mouth 3 (three) times daily with meals.     aspirin EC  81 MG tablet  Take 81 mg by mouth daily.     atorvastatin 10 MG tablet  Commonly known as:  LIPITOR  Take 10 mg by mouth daily.     diltiazem 120 MG 24 hr capsule  Commonly known as:  CARDIZEM CD  Take 120 mg by mouth daily.     FLUoxetine 40 MG capsule  Commonly known as:  PROZAC  Take 40 mg by mouth daily.     folic acid A999333 MCG tablet  Commonly known as:  FOLVITE  Take 400 mcg by mouth daily.     gabapentin 300 MG capsule  Commonly known as:  NEURONTIN  Take 300 mg by mouth 3 (three) times daily.     levofloxacin 500 MG tablet  Commonly known as:  LEVAQUIN  Take 500 mg by mouth daily. For 10 more days     magnesium oxide 400 MG tablet  Commonly known as:  MAG-OX  Take 400 mg by mouth 2 (two) times daily.     metFORMIN 500 MG tablet  Commonly known as:  GLUCOPHAGE  Take 500 mg by mouth 2 (two) times daily with a meal.     metoprolol succinate 25 MG 24 hr tablet  Commonly known as:  TOPROL-XL  Take 25 mg by mouth daily.  mirtazapine 15 MG tablet  Commonly known as:  REMERON  Take 15 mg by mouth at bedtime.     nystatin powder  Commonly known as:  MYCOSTATIN  Apply 1 application topically three times a day for 14 days     saccharomyces boulardii 250 MG capsule  Commonly known as:  FLORASTOR  Take 250 mg by mouth 3 (three) times daily.     TAB-A-VITE PO  Take 1 tablet by mouth daily.     traZODone 50 MG tablet  Commonly known as:  DESYREL  Take 50 mg by mouth at bedtime.     vitamin C 1000 MG tablet  Take 1,000 mg by mouth daily.     Vitamin D3 2000 units Tabs  Take 2,000 Units by mouth daily. At 6 am        Meds ordered this encounter  Medications  . Acidophilus Lactobacillus CAPS    Sig: Take 1 capsule by mouth 3 (three) times daily with meals.  . nystatin (MYCOSTATIN) powder    Sig: Apply 1 application topically three times a day for 14 days  . aspirin EC 81 MG tablet    Sig: Take 81 mg by mouth daily.  . mirtazapine (REMERON) 15 MG  tablet    Sig: Take 15 mg by mouth at bedtime.  . Multiple Vitamin (TAB-A-VITE PO)    Sig: Take 1 tablet by mouth daily.  . Cholecalciferol (VITAMIN D3) 2000 units TABS    Sig: Take 2,000 Units by mouth daily. At 6 am  . saccharomyces boulardii (FLORASTOR) 250 MG capsule    Sig: Take 250 mg by mouth 3 (three) times daily.     There is no immunization history on file for this patient.  Social History  Substance Use Topics  . Smoking status: Never Smoker   . Smokeless tobacco: Not on file  . Alcohol Use: No      Family History  Problem Relation Age of Onset  . Drug abuse Brother   . Breast cancer Neg Hx       Review of Systems  DATA OBTAINED: from patient, nurse GENERAL:  no fevers, +fatigue, appetite changes SKIN: No itching, rash or wounds EYES: No eye pain, redness, discharge EARS: No earache, tinnitus, change in hearing NOSE: No congestion, drainage or bleeding  MOUTH/THROAT: No mouth or tooth pain, No sore throat RESPIRATORY: No cough, wheezing, SOB CARDIAC: No chest pain, palpitations, lower extremity edema  GI: No abdominal pain, No N/V/D or constipation, No heartburn or reflux  GU: No dysuria, frequency or urgency, or incontinence  MUSCULOSKELETAL: No unrelieved bone/joint pain NEUROLOGIC: No headache, dizziness or focal weakness PSYCHIATRIC: No c/o anxiety or sadness   Filed Vitals:   12/21/15 0853  BP: 146/74  Pulse: 79  Temp: 98 F (36.7 C)  Resp: 15    SpO2 Readings from Last 1 Encounters:  No data found for SpO2        Physical Exam  GENERAL APPEARANCE: Alert, conversant,  No acute distress.  SKIN: No diaphoresis rash HEAD: Normocephalic, atraumatic  EYES: Conjunctiva/lids clear. Pupils round, reactive. EOMs intact.  EARS: External exam WNL, canals clear. Hearing grossly normal.  NOSE: No deformity or discharge.  MOUTH/THROAT: Lips w/o lesions  RESPIRATORY: Breathing is even, unlabored. Lung sounds are clear   CARDIOVASCULAR: Heart  RRR no murmurs, rubs or gallops. No peripheral edema.   GASTROINTESTINAL: Abdomen is soft, non-tender, not distended w/ normal bowel sounds. GENITOURINARY: Bladder non tender, not distended  MUSCULOSKELETAL: No  abnormal joints or musculature NEUROLOGIC:  Cranial nerves 2-12 grossly intact. Moves all extremities  PSYCHIATRIC: Mood and affect appropriate to situation, no behavioral issues  Patient Active Problem List   Diagnosis Date Noted  . Sepsis ([place]) 12/26/2015  . CAP (community acquired pneumonia) 12/26/2015  . SOB (shortness of breath) 06/07/2015  . Ulcer of right groin ([place]) 06/02/2015  . Falls frequently 01/05/2015  . Dizziness 01/05/2015  . Candidal intertrigo 01/05/2015  . Diabetes mellitus without complication ([place])   . Depression   . Hypertension   . Recurrent UTI   . Cancer of breast ([place])   . Hyperlipidemia        Component Value Date/Time   WBC 12.2 12/14/2015   HGB 10.1* 12/14/2015   HCT 32* 12/14/2015   PLT 279 12/14/2015        Component Value Date/Time   NA 135* 12/14/2015   K 5.4* 12/14/2015   BUN 20 12/14/2015   CREATININE 1.2* 12/14/2015   AST 21 12/14/2015   ALT 10 12/14/2015   ALKPHOS 142* 12/14/2015    No results found for: HGBA1C  No results found for: CHOL, HDL, LDLCALC, LDLDIRECT, TRIG, CHOLHDL   Patient was never admitted.  Not all labs, radiology exams or other studies done during hospitalization come through on my EPIC note; however they are reviewed by me.    Assessment and Plan  Sepsis ([place]) 2/2 to UTI or PNA or both; tx with broad s[pectrum abx;  SNF - pt d/c with 8 more days of levaquin  Recurrent UTI SNF - tx with broad spectrum abx and d/c with 8 more days of levaquin  CAP (community acquired pneumonia) SNf -   RUL infiltrate tx with broad spectrum in hospital then 8 more days of levaquin  Hypertension SNF - resumed toprol XL 25 mg daily and diltiazem 120 mg 24 hour capsule daily; controlled ;cont current  meds  Diabetes mellitus without complication SNF available BS don't represent good control on glucophage 500mg  BID; will monitor in SNF  Hyperlipidemia SNF - not stated as uncontrolled ; cont lipitor 10 mg daily  Depression SNF - cont prozac 10 mg, remeron 15 mg and trazodone nightly for both depression and sleep.   Time spent . 45 min;> 50% of time with patient was spent reviewing records, labs, tests and studies, counseling and developing plan of care  Webb Silversmith D. Sheppard Coil, MD

## 2015-12-26 ENCOUNTER — Encounter: Payer: Self-pay | Admitting: Internal Medicine

## 2015-12-26 DIAGNOSIS — A419 Sepsis, unspecified organism: Secondary | ICD-10-CM | POA: Insufficient documentation

## 2015-12-26 DIAGNOSIS — J189 Pneumonia, unspecified organism: Secondary | ICD-10-CM | POA: Insufficient documentation

## 2015-12-26 NOTE — Assessment & Plan Note (Signed)
SNF - tx with broad spectrum abx and d/c with 8 more days of levaquin

## 2015-12-26 NOTE — Assessment & Plan Note (Signed)
SNf -   RUL infiltrate tx with broad spectrum in hospital then 8 more days of levaquin

## 2015-12-26 NOTE — Assessment & Plan Note (Signed)
2/2 to UTI or PNA or both; tx with broad s[pectrum abx;  SNF - pt d/c with 8 more days of levaquin

## 2015-12-27 NOTE — Assessment & Plan Note (Signed)
SNF available BS don't represent good control on glucophage 500mg  BID; will monitor in SNF

## 2015-12-27 NOTE — Assessment & Plan Note (Signed)
SNF - cont prozac 10 mg, remeron 15 mg and trazodone nightly for both depression and sleep.

## 2015-12-27 NOTE — Assessment & Plan Note (Signed)
SNF - resumed toprol XL 25 mg daily and diltiazem 120 mg 24 hour capsule daily; controlled ;cont current meds

## 2015-12-27 NOTE — Assessment & Plan Note (Signed)
SNF - not stated as uncontrolled ; cont lipitor 10 mg daily

## 2015-12-28 ENCOUNTER — Encounter: Payer: Self-pay | Admitting: Internal Medicine

## 2015-12-28 ENCOUNTER — Non-Acute Institutional Stay (SKILLED_NURSING_FACILITY): Payer: Medicare Other | Admitting: Internal Medicine

## 2015-12-28 DIAGNOSIS — K224 Dyskinesia of esophagus: Secondary | ICD-10-CM | POA: Diagnosis not present

## 2015-12-28 DIAGNOSIS — K219 Gastro-esophageal reflux disease without esophagitis: Secondary | ICD-10-CM | POA: Diagnosis not present

## 2015-12-28 NOTE — Progress Notes (Signed)
MRN: [id number] Name: Vickie Murphy  Sex: female Age: 82 y.o. DOB: 1932/09/14  [place] #: [name] Facility/Room:505 Level Of Care: SNF Provider: Rober Minion Emergency Contacts: Extended Emergency Contact Information Primary Emergency Contact: [name],[name] Address: [address]          [place], [place] [address] [place] Phone: [phone] Relation: Friend Secondary Emergency Contact: [name],[name]          [place], [place] Mobile Phone: [phone] Relation: Daughter  Code Status: DNR  Allergies: Review of patient's allergies indicates no known allergies.  Chief Complaint  Patient presents with  . Acute Visit    Acute    HPI: Patient is 82 y.o. female who is being seen today for esophageal motility problems. ST came to me with her concerns. Pt is in SNF for rehab after CAP. ST has concerns about pt having esophageal dysmotility problems or a stricture. Pt c/o food sticking in her throat, has intermiddently hoarseness, does better with fluids or moist food and is c/o reflux. In addition pt has esophageal CA in her family. Pt is not coughing with eating. There is no mention of any swallowing problems in recent hospitalization.  Past Medical History  Diagnosis Date  . Diabetes mellitus without complication ([place])   . Depression   . Hypertension   . Recurrent UTI   . Cancer [facility])     breast-s/p mastectomy and on chemo  . Hyperlipidemia     Past Surgical History  Procedure Laterality Date  . Cardiac valve replacement      aortic, years ago-on no anticoagulation  . Breast surgery Right       Medication List       This list is accurate as of: 12/28/15  8:49 PM.  Always use your most recent med list.               aspirin EC 81 MG tablet  Take 81 mg by mouth daily.     atorvastatin 10 MG tablet  Commonly known as:  LIPITOR  Take 10 mg by mouth daily. At night     diltiazem 120 MG 24 hr capsule  Commonly known as:   CARDIZEM CD  Take 120 mg by mouth daily.     FLUoxetine 40 MG capsule  Commonly known as:  PROZAC  Take 40 mg by mouth daily.     folic acid A999333 MCG tablet  Commonly known as:  FOLVITE  Take 400 mcg by mouth daily.     gabapentin 300 MG capsule  Commonly known as:  NEURONTIN  Take 300 mg by mouth 3 (three) times daily.     levofloxacin 500 MG tablet  Commonly known as:  LEVAQUIN  Take 500 mg by mouth daily. For 10 more days     magnesium oxide 400 MG tablet  Commonly known as:  MAG-OX  Take 400 mg by mouth daily.     metFORMIN 500 MG tablet  Commonly known as:  GLUCOPHAGE  Take 500 mg by mouth 2 (two) times daily with a meal.     metoprolol succinate 25 MG 24 hr tablet  Commonly known as:  TOPROL-XL  Take 25 mg by mouth daily.     mirtazapine 15 MG tablet  Commonly known as:  REMERON  Take 15 mg by mouth at bedtime.     nystatin powder  Commonly known as:  MYCOSTATIN  Apply 1 application topically three times a day for 14 days  saccharomyces boulardii 250 MG capsule  Commonly known as:  FLORASTOR  Take 250 mg by mouth 3 (three) times daily. Stop Date 01/03/16     TAB-A-VITE PO  Take 1 tablet by mouth daily.     traZODone 50 MG tablet  Commonly known as:  DESYREL  Take 50 mg by mouth at bedtime.     vitamin C 1000 MG tablet  Take 1,000 mg by mouth daily.     Vitamin D3 2000 units Tabs  Take 2,000 Units by mouth daily. At 6 am        No orders of the defined types were placed in this encounter.    Immunization History  Administered Date(s) Administered  . PPD Test 12/17/2015    Social History  Substance Use Topics  . Smoking status: Never Smoker   . Smokeless tobacco: Not on file  . Alcohol Use: No    Review of Systems  DATA OBTAINED: from patient, nurse, ST - as per HPI GENERAL:  no fevers, fatigue, appetite changes SKIN: No itching, rash HEENT:swallowing diffiulties as per HPI RESPIRATORY: No cough, wheezing, SOB CARDIAC: No chest  pain, palpitations, lower extremity edema  GI: No abdominal pain, No N/V/D or constipation, +reflux  GU: No dysuria, frequency or urgency, or incontinence  MUSCULOSKELETAL: No unrelieved bone/joint pain NEUROLOGIC: No headache, dizziness  PSYCHIATRIC: No overt anxiety or sadness  Filed Vitals:   12/28/15 1230  BP: 146/74  Pulse: 70  Temp: 98 F (36.7 C)  Resp: 20    Physical Exam  GENERAL APPEARANCE: Alert, conversant, No acute distress  SKIN: No diaphoresis rash HEENT: Unremarkable RESPIRATORY: Breathing is even, unlabored. Lung sounds are clear   CARDIOVASCULAR: Heart RRR no murmurs, rubs or gallops. No peripheral edema  GASTROINTESTINAL: Abdomen is soft, non-tender, not distended w/ normal bowel sounds.  GENITOURINARY: Bladder non tender, not distended  MUSCULOSKELETAL: No abnormal joints or musculature NEUROLOGIC: Cranial nerves 2-12 grossly intact. Moves all extremities PSYCHIATRIC: Mood and affect appropriate to situation, no behavioral issues  Patient Active Problem List   Diagnosis Date Noted  . Sepsis ([place]) 12/26/2015  . CAP (community acquired pneumonia) 12/26/2015  . SOB (shortness of breath) 06/07/2015  . Ulcer of right groin ([place]) 06/02/2015  . Falls frequently 01/05/2015  . Dizziness 01/05/2015  . Candidal intertrigo 01/05/2015  . Diabetes mellitus without complication ([place])   . Depression   . Hypertension   . Recurrent UTI   . Cancer of breast ([place])   . Hyperlipidemia     CBC    Component Value Date/Time   WBC 8.5 12/18/2015   HGB 10.0* 12/18/2015   HCT 30* 12/18/2015   PLT 265 12/18/2015    CMP     Component Value Date/Time   NA 137 12/18/2015   K 3.9 12/18/2015   BUN 11 12/18/2015   CREATININE 0.8 12/18/2015   AST 21 12/14/2015   ALT 10 12/14/2015   ALKPHOS 142* 12/14/2015    Assessment and Plan  GERD/ESOPHAGEAL/SWALLOWING PROBLEMS - Pt re-iterated ST concerns;  I have started pt on omeprazole 40 mg daily; have written for GI  consult re esophageal dysmotility problems with h/o family hx of esophageal CA    Time spent > 25 min;> 50% of time with patient was spent reviewing records, labs, tests and studies, counseling and developing plan of care  Webb Silversmith D. Sheppard Coil, MD

## 2016-01-17 ENCOUNTER — Non-Acute Institutional Stay (SKILLED_NURSING_FACILITY): Payer: Medicare Other | Admitting: Internal Medicine

## 2016-01-17 ENCOUNTER — Encounter: Payer: Self-pay | Admitting: Internal Medicine

## 2016-01-17 DIAGNOSIS — J189 Pneumonia, unspecified organism: Secondary | ICD-10-CM

## 2016-01-17 DIAGNOSIS — K219 Gastro-esophageal reflux disease without esophagitis: Secondary | ICD-10-CM | POA: Diagnosis not present

## 2016-01-17 DIAGNOSIS — E119 Type 2 diabetes mellitus without complications: Secondary | ICD-10-CM

## 2016-01-17 DIAGNOSIS — I10 Essential (primary) hypertension: Secondary | ICD-10-CM

## 2016-01-17 NOTE — Progress Notes (Signed)
Location:  [facility] Room Number: 505/P Place of Service:  SNF [phone]) Provider:  Granville Lewis  No primary care provider on file.  No care team member to display  Extended Emergency Contact Information Primary Emergency Contact: [name],[name] Address: [address]          [place], [place] [address] [place] Phone: [phone] Relation: Friend Secondary Emergency Contact: [name],[name]          [place], [place] Phone: [phone] Relation: Daughter  Code Status:  DNR Goals of care: Advanced Directive information Advanced Directives 01/17/2016  Does patient have an advance directive? Yes  Type of Advance Directive Out of facility DNR (pink MOST or yellow form)  Does patient want to make changes to advanced directive? No - Patient declined  Copy of advanced directive(s) in chart? Yes     Chief Complaint  Patient presents with  . Discharge Note    HPI:  Pt is a 82 y.o. female seen today for Discharge She has a history of diabetes type 2 hypertension and depression was admitted to [facility] from May 9 to May 15 after presenting with weakness and a productive cough.  She was also treated for UTIs note patient.  She was diagnosed with sepsis secondary to UTI and pneumonia treated with broad-spectrum antibiotics followed by Levaquin.  Hospital course complicated by mild hypotension so miserable held.  Patient's was admitted to skilled nursing for PT and OT with generalized weakness.  While in skilled nursing she was followed for diabetes type 2 she is on Glucophage.  Hyperlipidemia treated with Lipitor-and depression that has been treated with Prozac.  Her stay here has been relatively uneventful-she has gained strength she will be returning to independent living-she will need home health support and continued PT and OT for strengthening with her history of weakness and  hospitalization.  She did recently complain of some esophageal dysphagia to Dr. Sheppard Coil and was started  on Prilosec-and also ordered a GI consult-since patient does have a family history of esophageal cancer. GI has seen her-and have ordered apparently an EGD for follow-up--  Currently she has no complaints she is looking forward to going home she is in independent living but does have housekeeping and meals provided in a social environment       Past Medical History  Diagnosis Date  . Diabetes mellitus without complication ([place])   . Depression   . Hypertension   . Recurrent UTI   . Cancer [facility])     breast-s/p mastectomy and on chemo  . Hyperlipidemia    Past Surgical History  Procedure Laterality Date  . Cardiac valve replacement      aortic, years ago-on no anticoagulation  . Breast surgery Right     No Known Allergies    Medication List       This list is accurate as of: 01/17/16  3:12 PM.  Always use your most recent med list.               aspirin EC 81 MG tablet  Take 81 mg by mouth daily.     atorvastatin 10 MG tablet  Commonly known as:  LIPITOR  Take 10 mg by mouth daily. At night     diltiazem 120 MG 24 hr capsule  Commonly known as:  CARDIZEM CD  Take 120 mg by mouth daily.     FLUoxetine 40 MG capsule  Commonly known as:  PROZAC  Take 40 mg by mouth daily.     folic acid A999333 MCG tablet  Commonly known as:  FOLVITE  Take 400 mcg by mouth daily.     gabapentin 300 MG capsule  Commonly known as:  NEURONTIN  Take 300 mg by mouth 3 (three) times daily.     magnesium oxide 400 MG tablet  Commonly known as:  MAG-OX  Take 400 mg by mouth daily.     metFORMIN 500 MG tablet  Commonly known as:  GLUCOPHAGE  Take 500 mg by mouth 2 (two) times daily with a meal.     metoprolol succinate 25 MG 24 hr tablet  Commonly known as:  TOPROL-XL  Take 25 mg by mouth daily.     mirtazapine 15 MG tablet  Commonly known as:  REMERON  Take 15 mg by  mouth at bedtime.     nystatin powder  Commonly known as:  MYCOSTATIN  Apply 1 application topically three times a day for 14 days     TAB-A-VITE PO  Take 1 tablet by mouth daily.     traZODone 100 MG tablet  Commonly known as:  DESYREL  Take 100 mg by mouth at bedtime.     Vitamin D3 2000 units Tabs  Take 2,000 Units by mouth daily. At 6 am        Review of Systems   In GEN general does not complain of fever or chills.  Skin is not complaining of rashes or itching.  Head ears eyes nose mouth and throat does not complaining of any visual changes or sore throat-again does have some previous complaints of esophageal issues as noted above-also she is not complaining of difficulty today.  Respiration not complaining shortness breath or cough.  Cardiac no chest pain or significant lower extremity edema.  GI does not complaining of abdominal pain nausea vomiting diarrhea or constipation.  Musculoskeletal is not complaining of joint pain does still have some weakness he is ambulatory with a walker.  Neurologic is not complaining of dizziness headache or syncopal-type feelings.  In psych is not complaining currently of anxiety or depression Dr. Sheppard Coil at one point did increase her trazodone secondary to insomnia complaints  Immunization History  Administered Date(s) Administered  . PPD Test 12/17/2015   Pertinent  Health Maintenance Due  Topic Date Due  . HEMOGLOBIN A1C  07/18/2016 (Originally [birth year])  . FOOT EXAM  01/16/2017 (Originally 09/03/1942)  . OPHTHALMOLOGY EXAM  01/16/2017 (Originally 09/03/1942)  . URINE MICROALBUMIN  01/16/2017 (Originally 09/03/1942)  . DEXA SCAN  01/16/2017 (Originally 09/04/1999)  . PNA vac Low Risk Adult (1 of 2 - PCV13) 01/16/2017 (Originally 09/04/1999)  . INFLUENZA VACCINE  03/07/2016   No flowsheet data found. Functional Status Survey:    Filed Vitals:   01/17/16 1509  BP: 127/63  Pulse: 78  Temp: 98 F (36.7 C)  TempSrc:  Oral  Resp: 20  Height: 5\' 3"  (1.6 m)  Weight: 131 lb 14.4 oz (59.829 kg)   Body mass index is 23.37 kg/(m^2). Physical Exam   Temperature 98.0 pulse 80 respirations 20 blood pressure 127/63.  In general this is a pleasant elderly female in no distress sitting comfortably in her wheelchair.  Her skin is warm and dry.  Oropharynx is clear mucous membranes moist.  Chest is clear to auscultation there is no labored breathing.  Heart is regular rate and rhythm without murmur gallop or rub she does not have any lower  extremity edema.  Abdomen is soft nontender with positive bowel sounds.  Musculoskeletal I did not note any abnormal joints-she is able to stand without assistance and use of a walker.  Neurologic is grossly intact her speech is clear no lateralizing findings.  Psych she is alert and oriented pleasant and appropriate  Labs reviewed:  Recent Labs  12/14/15 12/18/15  NA 135* 137  K 5.4* 3.9  BUN 20 11  CREATININE 1.2* 0.8    Recent Labs  12/14/15  AST 21  ALT 10  ALKPHOS 142*    Recent Labs  12/14/15 12/18/15  WBC 12.2 8.5  HGB 10.1* 10.0*  HCT 32* 30*  PLT 279 265          Assessment/Plan  1 history of sepsis secondary UTI pneumonia-she appears to have recovered from this she is afebrile does not complain of any fever or chills shortness of breath or cough she has completed a course of Levaquin in the facility.  #2 hypertension this appears stable recent blood pressures 127/63-108/60-I do see a couple   systolicsin the higher 0000000  previously but this is not common she is not symptomatic of hypotension with no dizziness complaints or syncopal-type feelings will ldefer to primary care provider to make any medication changes since she has been stable  #3 diabetes type 2 CBGs have been stable in the 100s per nursing she is on Glucophage 500 mg twice a day.  #4 hyperlipidemia she is on a statin Lipitor 10 mg a day since her stay here was quite  short was not aggressive pursuing a lipid panel will defer to primary care provider.  #5 depression continues on Prozac 40 mg a day as well as Remeron 15 mg-and trazodone nightly-this appears stable patient appears to be in good spirits is looking forward to getting back home.  #6 history of esophageal dysmotilityQuestion GERD-like symptoms-as noted above she is being followed by GI for this follow-up has been scheduled apparently she will be getting an EGD followed by the GI physicians.--She has been started on Prilosec  Again patient will be going home she will need home health to evaluate treat as well as PT and OT for strengthening with her history of recent hospitalization pneumonia UTI and somewhat generalized weakness.  Will also order updated lab work to be drawn by home health and primary care provider notified of results  [phone] note greater than 30 minutes spent on this discharge summary-greater than 50% of time spent coordinating plan of care as well as reviewing and writing prescriptions  .      Oralia Manis, [place]

## 2016-01-18 ENCOUNTER — Encounter: Payer: Self-pay | Admitting: Internal Medicine

## 2016-01-18 NOTE — Progress Notes (Signed)
Opened in error

## 2016-01-24 ENCOUNTER — Other Ambulatory Visit: Payer: Self-pay | Admitting: Internal Medicine

## 2016-02-15 LAB — HEPATIC FUNCTION PANEL
ALT: 5 U/L — AB (ref 7–35)
AST: 18 U/L (ref 13–35)
Alkaline Phosphatase: 94 U/L (ref 25–125)
Bilirubin, Total: 0.5 mg/dL

## 2016-02-18 LAB — CBC AND DIFFERENTIAL
HEMATOCRIT: 28 % — AB (ref 36–46)
Hemoglobin: 8.7 g/dL — AB (ref 12.0–16.0)
PLATELETS: 232 10*3/uL (ref 150–399)
WBC: 8.2 10^3/mL

## 2016-02-19 LAB — BASIC METABOLIC PANEL
BUN: 24 mg/dL — AB (ref 4–21)
CREATININE: 0.5 mg/dL (ref 0.5–1.1)
POTASSIUM: 4.5 mmol/L (ref 3.4–5.3)
Sodium: 134 mmol/L — AB (ref 137–147)

## 2016-02-23 ENCOUNTER — Encounter: Payer: Self-pay | Admitting: Internal Medicine

## 2016-02-23 ENCOUNTER — Non-Acute Institutional Stay (SKILLED_NURSING_FACILITY): Payer: Medicare Other | Admitting: Internal Medicine

## 2016-02-23 DIAGNOSIS — I10 Essential (primary) hypertension: Secondary | ICD-10-CM | POA: Diagnosis not present

## 2016-02-23 DIAGNOSIS — J189 Pneumonia, unspecified organism: Secondary | ICD-10-CM

## 2016-02-23 DIAGNOSIS — E119 Type 2 diabetes mellitus without complications: Secondary | ICD-10-CM

## 2016-02-23 DIAGNOSIS — B9689 Other specified bacterial agents as the cause of diseases classified elsewhere: Secondary | ICD-10-CM

## 2016-02-23 DIAGNOSIS — N3090 Cystitis, unspecified without hematuria: Secondary | ICD-10-CM | POA: Diagnosis not present

## 2016-02-23 DIAGNOSIS — F329 Major depressive disorder, single episode, unspecified: Secondary | ICD-10-CM

## 2016-02-23 DIAGNOSIS — E1140 Type 2 diabetes mellitus with diabetic neuropathy, unspecified: Secondary | ICD-10-CM

## 2016-02-23 DIAGNOSIS — F32A Depression, unspecified: Secondary | ICD-10-CM

## 2016-02-23 DIAGNOSIS — A419 Sepsis, unspecified organism: Secondary | ICD-10-CM | POA: Diagnosis not present

## 2016-02-23 DIAGNOSIS — E785 Hyperlipidemia, unspecified: Secondary | ICD-10-CM

## 2016-02-23 DIAGNOSIS — K219 Gastro-esophageal reflux disease without esophagitis: Secondary | ICD-10-CM

## 2016-02-23 NOTE — Progress Notes (Signed)
MRN: [id number] Name: Vickie Murphy  Sex: female Age: 82 y.o. DOB: 04/26/1933  [place] #:  Facility/Room: [place] / 112 P Level Of Care: SNF Provider: Noah Delaine. Sheppard Coil, MD Emergency Contacts: Extended Emergency Contact Information Primary Emergency Contact: [name],[name] Address: [address]          [place], [place] [address] [place] Phone: [phone] Relation: Friend Secondary Emergency Contact: [name],[name]          [place], [place] Mobile Phone: [phone] Relation: Daughter  Code Status: DNR  Allergies: Review of patient's allergies indicates no known allergies.  Chief Complaint  Patient presents with  . New Admit To SNF    Admit to Facility    HPI: Patient is 82 y.o. female who was admitted to [facility] from 7/11-18 for sepsis with shock 2/2 PNA. Hospital course was complicated by a klebsiella UTI, lactic acidosis and urinary retention. Pt is admitted to SNF for generalized weakness for OT/PT. While at SNF pt will be followed for DM2, tx with metformin and mealtime insulin, HLD, tx with lipitor and depression, tx with prozac.  Past Medical History  Diagnosis Date  . Diabetes mellitus without complication ([place])   . Depression   . Hypertension   . Recurrent UTI   . Cancer [facility])     breast-s/p mastectomy and on chemo  . Hyperlipidemia   . Breast cancer ([place]) 2002    right  . Acid reflux   . CHF (congestive heart failure) ([place])     pt reports hx of such    Past Surgical History  Procedure Laterality Date  . Cardiac valve replacement      aortic, years ago-on no anticoagulation  . Breast surgery Right   . Mastectomy Right   . Breast biopsy    . Wisdom tooth extraction    . Kidney stone surgery        Medication List       This list is accurate as of: 02/23/16 11:59 PM.  Always use your most recent med list.               acetaminophen 325 MG tablet  Commonly known as:  TYLENOL  Take 650 mg by mouth every 6  (six) hours as needed for mild pain or fever.     albuterol (2.5 MG/3ML) 0.083% nebulizer solution  Commonly known as:  PROVENTIL  Take 2.5 mg by nebulization every 6 (six) hours as needed for wheezing or shortness of breath.     ascorbic acid 500 MG tablet  Commonly known as:  VITAMIN C  Take 500 mg by mouth daily.     aspirin EC 81 MG tablet  Take 81 mg by mouth daily.     atorvastatin 10 MG tablet  Commonly known as:  LIPITOR  Take 10 mg by mouth every evening. At night     azithromycin 500 MG tablet  Commonly known as:  ZITHROMAX  Take 500 mg by mouth daily. Take 1 tablet by mouth for 5 days. Take 1 tablet by mouth for 3 days     cefdinir 300 MG capsule  Commonly known as:  OMNICEF  Take 300 mg by mouth 2 (two) times daily. For 5 days.     dextrose 40 % Gel  Commonly known as:  GLUTOSE  Take 37.5 g (15 g of dextrose total) by mouth every 15 minutes as needed for hypoglycemia     diltiazem 120 MG 24  hr capsule  Commonly known as:  CARDIZEM CD  Take 120 mg by mouth daily.     docusate sodium 100 MG capsule  Commonly known as:  COLACE  Take 100 mg by mouth 2 (two) times daily as needed for mild constipation.     FLUoxetine 40 MG capsule  Commonly known as:  PROZAC  Take 40 mg by mouth daily.     folic acid A999333 MCG tablet  Commonly known as:  FOLVITE  Take 400 mcg by mouth daily.     gabapentin 300 MG capsule  Commonly known as:  NEURONTIN  Take 300 mg by mouth 3 (three) times daily.     guaifenesin 100 MG/5ML syrup  Commonly known as:  ROBITUSSIN  Take 200 mg by mouth every 4 (four) hours as needed for cough.     insulin glargine 100 UNIT/ML injection  Commonly known as:  LANTUS  Inject 10 Units into the skin at bedtime.     insulin lispro 100 UNIT/ML injection  Commonly known as:  HUMALOG  Inject 0-18 Units into the skin 3 (three) times daily before meals.     LACTOBACILLUS BIFIDUS PO  Take 1 capsule by mouth 2 (two) times daily with a meal. For 10  days.     magnesium oxide 400 MG tablet  Commonly known as:  MAG-OX  Take 400 mg by mouth daily.     meclizine 12.5 MG tablet  Commonly known as:  ANTIVERT  Take 12.5 mg by mouth 3 (three) times daily as needed.     metFORMIN 500 MG tablet  Commonly known as:  GLUCOPHAGE  Take 500 mg by mouth 2 (two) times daily with a meal.     mirtazapine 15 MG tablet  Commonly known as:  REMERON  Take 15 mg by mouth at bedtime.     omeprazole 40 MG capsule  Commonly known as:  PRILOSEC  Take 40 mg by mouth daily.     promethazine 25 MG tablet  Commonly known as:  PHENERGAN  Take 25 mg by mouth every 6 (six) hours as needed for nausea.     TAB-A-VITE PO  Take 1 tablet by mouth daily.     tamsulosin 0.4 MG Caps capsule  Commonly known as:  FLOMAX  Take 0.4 mg by mouth at bedtime. For 5 days     traZODone 100 MG tablet  Commonly known as:  DESYREL  Take 100 mg by mouth at bedtime.     Vitamin D3 2000 units Tabs  Take 2,000 Units by mouth daily. At 6 am        Meds ordered this encounter  Medications  . acetaminophen (TYLENOL) 325 MG tablet    Sig: Take 650 mg by mouth every 6 (six) hours as needed for mild pain or fever.  Marland Kitchen albuterol (PROVENTIL) (2.5 MG/3ML) 0.083% nebulizer solution    Sig: Take 2.5 mg by nebulization every 6 (six) hours as needed for wheezing or shortness of breath.  Marland Kitchen azithromycin (ZITHROMAX) 500 MG tablet    Sig: Take 500 mg by mouth daily. Take 1 tablet by mouth for 5 days. Take 1 tablet by mouth for 3 days  . cefdinir (OMNICEF) 300 MG capsule    Sig: Take 300 mg by mouth 2 (two) times daily. For 5 days.  Marland Kitchen dextrose (GLUTOSE) 40 % GEL    Sig: Take 37.5 g (15 g of dextrose total) by mouth every 15 minutes as needed for hypoglycemia  . docusate  sodium (COLACE) 100 MG capsule    Sig: Take 100 mg by mouth 2 (two) times daily as needed for mild constipation.  Marland Kitchen guaifenesin (ROBITUSSIN) 100 MG/5ML syrup    Sig: Take 200 mg by mouth every 4 (four) hours as  needed for cough.  . insulin glargine (LANTUS) 100 UNIT/ML injection    Sig: Inject 10 Units into the skin at bedtime.  Marland Kitchen LACTOBACILLUS BIFIDUS PO    Sig: Take 1 capsule by mouth 2 (two) times daily with a meal. For 10 days.  . tamsulosin (FLOMAX) 0.4 MG CAPS capsule    Sig: Take 0.4 mg by mouth at bedtime. For 5 days  . insulin lispro (HUMALOG) 100 UNIT/ML injection    Sig: Inject 0-18 Units into the skin 3 (three) times daily before meals.  Marland Kitchen ascorbic acid (VITAMIN C) 500 MG tablet    Sig: Take 500 mg by mouth daily.  . meclizine (ANTIVERT) 12.5 MG tablet    Sig: Take 12.5 mg by mouth 3 (three) times daily as needed.  Marland Kitchen omeprazole (PRILOSEC) 40 MG capsule    Sig: Take 40 mg by mouth daily.  . promethazine (PHENERGAN) 25 MG tablet    Sig: Take 25 mg by mouth every 6 (six) hours as needed for nausea.    Immunization History  Administered Date(s) Administered  . PPD Test 12/17/2015    Social History  Substance Use Topics  . Smoking status: Never Smoker   . Smokeless tobacco: Never Used  . Alcohol Use: No    Family history is   Family History  Problem Relation Age of Onset  . Drug abuse Brother   . Breast cancer Neg Hx   . Cancer Father       Review of Systems  DATA OBTAINED: from patient, nurse GENERAL:  no fevers, fatigue, appetite changes SKIN: No itching, rash or wounds EYES: No eye pain, redness, discharge EARS: No earache, tinnitus, change in hearing NOSE: No congestion, drainage or bleeding  MOUTH/THROAT: No mouth or tooth pain, No sore throat RESPIRATORY: No cough, wheezing, SOB CARDIAC: No chest pain, palpitations, lower extremity edema  GI: No abdominal pain, No N/V/D or constipation, No heartburn or reflux  GU: No dysuria, frequency or urgency, or incontinence  MUSCULOSKELETAL: No unrelieved bone/joint pain NEUROLOGIC: No headache, dizziness or focal weakness PSYCHIATRIC: No c/o anxiety or sadness   Filed Vitals:   02/23/16 0815  BP: 108/64   Pulse: 91  Temp: 98.4 F (36.9 C)  Resp: 18    SpO2 Readings from Last 1 Encounters:  No data found for SpO2        Physical Exam  GENERAL APPEARANCE: Alert, conversant,  No acute distress.  SKIN: No diaphoresis rash HEAD: Normocephalic, atraumatic  EYES: Conjunctiva/lids clear. Pupils round, reactive. EOMs intact.  EARS: External exam WNL, canals clear. Hearing grossly normal.  NOSE: No deformity or discharge.  MOUTH/THROAT: Lips w/o lesions  RESPIRATORY: Breathing is even, unlabored. Lung sounds are clear   CARDIOVASCULAR: Heart RRR no murmurs, rubs or gallops. No peripheral edema.   GASTROINTESTINAL: Abdomen is soft, non-tender, not distended w/ normal bowel sounds. GENITOURINARY: Bladder non tender, not distended  MUSCULOSKELETAL: No abnormal joints or musculature NEUROLOGIC:  Cranial nerves 2-12 grossly intact. Moves all extremities  PSYCHIATRIC: Mood and affect appropriate to situation, no behavioral issues  Patient Active Problem List   Diagnosis Date Noted  . Klebsiella cystitis 02/26/2016  . GERD (gastroesophageal reflux disease) 02/26/2016  . 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 Sepsis ([place]) 12/26/2015  . CAP (community acquired pneumonia) 12/26/2015  . SOB (shortness of breath) 06/07/2015  . Ulcer of right groin ([place]) 06/02/2015  . Falls frequently 01/05/2015  . Dizziness 01/05/2015  . Candidal intertrigo 01/05/2015  . Diabetes mellitus without complication ([place])   . Depression   . Hypertension   . Recurrent UTI   . Cancer of breast ([place])   . Hyperlipidemia        Component Value Date/Time   WBC 8.2 02/18/2016   HGB 8.7* 02/18/2016   HCT 28* 02/18/2016   PLT 232 02/18/2016        Component Value Date/Time   NA 134* 02/19/2016   K 4.5 02/19/2016   BUN 24* 02/19/2016   CREATININE 0.5 02/19/2016   AST 18 02/15/2016   ALT 5* 02/15/2016   ALKPHOS 94 02/15/2016    No results found for: HGBA1C  No results found  for: CHOL, HDL, LDLCALC, LDLDIRECT, TRIG, CHOLHDL   Patient was never admitted.  Not all labs, radiology exams or other studies done during hospitalization come through on my EPIC note; however they are reviewed by me.    Assessment and Plan  Sepsis ([place]) SNF - d/c summary very bare;felt 2/2 PNA; pt d/c with 5 more days of zithromax and omniceff  CAP (community acquired pneumonia) SNF - no info as to what was used in hospital but pr was d/c on 5 more days of omniceff and zithromax; cont robitussin and alb prn  Klebsiella cystitis SNF - pt d/c on 5 more days of omniceff  Diabetes mellitus without complication SNF - most BS were below 200 with insulin and SSI with meals without glucophage; with monitor  BS ac and qHS and change regimen as needd  Hypertension SNF - cont diltiazem 120 daily; BP will be monitored daily  Hyperlipidemia SNF - not stated as uncontrolled; cont lipitor 10 mg daily  Depression SNF - controlled;cont prozac 40 mg daily and remeron 15 mg qHS  GERD (gastroesophageal reflux disease) SMF - cont prilosec 40 mg daily  Neuropathy due to type 2 diabetes mellitus ([place]) SNF - controlled ;cont neurontin 300 mg TID    Time spent > 45 min;> 50% of time with patient was spent reviewing records, labs, tests and studies, counseling and developing plan of care  Webb Silversmith D. Sheppard Coil, MD

## 2016-02-26 ENCOUNTER — Encounter: Payer: Self-pay | Admitting: Internal Medicine

## 2016-02-26 DIAGNOSIS — N3090 Cystitis, unspecified without hematuria: Secondary | ICD-10-CM

## 2016-02-26 DIAGNOSIS — B9689 Other specified bacterial agents as the cause of diseases classified elsewhere: Secondary | ICD-10-CM | POA: Insufficient documentation

## 2016-02-26 DIAGNOSIS — E1140 Type 2 diabetes mellitus with diabetic neuropathy, unspecified: Secondary | ICD-10-CM | POA: Insufficient documentation

## 2016-02-26 DIAGNOSIS — B961 Klebsiella pneumoniae [K. pneumoniae] as the cause of diseases classified elsewhere: Secondary | ICD-10-CM | POA: Insufficient documentation

## 2016-02-26 DIAGNOSIS — K219 Gastro-esophageal reflux disease without esophagitis: Secondary | ICD-10-CM | POA: Insufficient documentation

## 2016-02-26 NOTE — Assessment & Plan Note (Signed)
SNF - not stated as uncontrolled; cont lipitor 10 mg daily

## 2016-02-26 NOTE — Assessment & Plan Note (Signed)
SNF - pt d/c on 5 more days of omniceff

## 2016-02-26 NOTE — Assessment & Plan Note (Signed)
SNF - cont diltiazem 120 daily; BP will be monitored daily

## 2016-02-26 NOTE — Assessment & Plan Note (Signed)
SMF - cont prilosec 40 mg daily

## 2016-02-26 NOTE — Assessment & Plan Note (Signed)
SNF - controlled ;cont neurontin 300 mg TID

## 2016-02-26 NOTE — Assessment & Plan Note (Signed)
SNF - d/c summary very bare;felt 2/2 PNA; pt d/c with 5 more days of zithromax and omniceff

## 2016-02-26 NOTE — Assessment & Plan Note (Signed)
SNF - most BS were below 200 with insulin and SSI with meals without glucophage; with monitor  BS ac and qHS and change regimen as needd

## 2016-02-26 NOTE — Assessment & Plan Note (Signed)
SNF - controlled;cont prozac 40 mg daily and remeron 15 mg qHS

## 2016-02-26 NOTE — Assessment & Plan Note (Signed)
SNF - no info as to what was used in hospital but pr was d/c on 5 more days of omniceff and zithromax; cont robitussin and alb prn

## 2016-02-29 LAB — BASIC METABOLIC PANEL
BUN: 13 mg/dL (ref 4–21)
CREATININE: 0.9 mg/dL (ref 0.5–1.1)
GLUCOSE: 123 mg/dL
Potassium: 4.5 mmol/L (ref 3.4–5.3)
SODIUM: 139 mmol/L (ref 137–147)

## 2016-02-29 LAB — CBC AND DIFFERENTIAL
HEMATOCRIT: 28 % — AB (ref 36–46)
Hemoglobin: 8.9 g/dL — AB (ref 12.0–16.0)
PLATELETS: 378 10*3/uL (ref 150–399)
WBC: 6.9 10^3/mL

## 2016-03-06 ENCOUNTER — Non-Acute Institutional Stay (SKILLED_NURSING_FACILITY): Payer: Medicare Other | Admitting: Internal Medicine

## 2016-03-06 ENCOUNTER — Encounter: Payer: Self-pay | Admitting: Internal Medicine

## 2016-03-06 DIAGNOSIS — R059 Cough, unspecified: Secondary | ICD-10-CM

## 2016-03-06 DIAGNOSIS — R05 Cough: Secondary | ICD-10-CM | POA: Diagnosis not present

## 2016-03-06 DIAGNOSIS — J189 Pneumonia, unspecified organism: Secondary | ICD-10-CM

## 2016-03-06 NOTE — Progress Notes (Signed)
MRN: [id number] Name: Vickie Murphy  Sex: female Age: 82 y.o. DOB: 03/02/1933  [place] #:  Facility/Room: [place] / 112 P Level Of Care: SNF Provider: Noah Delaine. Sheppard Coil, MD Emergency Contacts: Extended Emergency Contact Information Primary Emergency Contact: [name],[name] Address: [address]          [place], [place] [address] [place] Phone: [phone] Relation: Friend Secondary Emergency Contact: [name],[name]          [place], [place] Mobile Phone: [phone] Relation: Daughter  Code Status: DNR  Allergies: Review of patient's allergies indicates no known allergies.  Chief Complaint  Patient presents with  . Acute Visit    Acute    HPI: Patient is 82 y.o. female who nursing asked me to see acutely . Pt has been coughing for several days, no fever. Pt admits some SOB, no CP. Pt feels like there is mucus but she can't get it coughed up.  Past Medical History:  Diagnosis Date  . Acid reflux   . Breast cancer ([place]) 2002   right  . Cancer [facility])    breast-s/p mastectomy and on chemo  . CHF (congestive heart failure) ([place])    pt reports hx of such  . Depression   . Diabetes mellitus without complication ([place])   . Hyperlipidemia   . Hypertension   . Recurrent UTI     Past Surgical History:  Procedure Laterality Date  . BREAST BIOPSY    . BREAST SURGERY Right   . CARDIAC VALVE REPLACEMENT     aortic, years ago-on no anticoagulation  . KIDNEY STONE SURGERY    . MASTECTOMY Right   . WISDOM TOOTH EXTRACTION        Medication List       Accurate as of 03/06/16  2:25 PM. Always use your most recent med list.          acetaminophen 325 MG tablet Commonly known as:  TYLENOL Take 650 mg by mouth every 6 (six) hours as needed for mild pain or fever.   albuterol (2.5 MG/3ML) 0.083% nebulizer solution Commonly known as:  PROVENTIL Take 2.5 mg by nebulization every 6 (six) hours as needed for wheezing or shortness of  breath.   ascorbic acid 500 MG tablet Commonly known as:  VITAMIN C Take 500 mg by mouth daily.   aspirin EC 81 MG tablet Take 81 mg by mouth daily.   atorvastatin 10 MG tablet Commonly known as:  LIPITOR Take 10 mg by mouth every evening. At night   dextrose 40 % Gel Commonly known as:  GLUTOSE Take 37.5 g (15 g of dextrose total) by mouth every 15 minutes as needed for hypoglycemia   diltiazem 120 MG 24 hr capsule Commonly known as:  CARDIZEM CD Take 120 mg by mouth daily.   docusate sodium 100 MG capsule Commonly known as:  COLACE Take 100 mg by mouth 2 (two) times daily as needed for mild constipation.   FLUoxetine 40 MG capsule Commonly known as:  PROZAC Take 40 mg by mouth daily.   folic acid A999333 MCG tablet Commonly known as:  FOLVITE Take 400 mcg by mouth daily.   gabapentin 300 MG capsule Commonly known as:  NEURONTIN Take 300 mg by mouth 3 (three) times daily.   guaifenesin 100 MG/5ML syrup Commonly known as:  ROBITUSSIN Take 200 mg by mouth every 4 (four) hours as needed for cough.   insulin glargine 100 UNIT/ML injection Commonly  known as:  LANTUS Inject 10 Units into the skin at bedtime.   insulin lispro 100 UNIT/ML injection Commonly known as:  HUMALOG Inject 0-18 Units into the skin 3 (three) times daily before meals.   magnesium oxide 400 MG tablet Commonly known as:  MAG-OX Take 400 mg by mouth daily.   meclizine 12.5 MG tablet Commonly known as:  ANTIVERT Take 12.5 mg by mouth 3 (three) times daily as needed.   metFORMIN 500 MG tablet Commonly known as:  GLUCOPHAGE Take 500 mg by mouth 2 (two) times daily with a meal.   mirtazapine 15 MG tablet Commonly known as:  REMERON Take 15 mg by mouth at bedtime.   omeprazole 40 MG capsule Commonly known as:  PRILOSEC Take 40 mg by mouth daily.   promethazine 25 MG tablet Commonly known as:  PHENERGAN Take 25 mg by mouth every 6 (six) hours as needed for nausea.   TAB-A-VITE PO Take 1  tablet by mouth daily.   traZODone 100 MG tablet Commonly known as:  DESYREL Take 100 mg by mouth at bedtime.   Vitamin D3 2000 units Tabs Take 2,000 Units by mouth daily. At 6 am       No orders of the defined types were placed in this encounter.   Immunization History  Administered Date(s) Administered  . PPD Test 12/17/2015    Social History  Substance Use Topics  . Smoking status: Never Smoker  . Smokeless tobacco: Never Used  . Alcohol use No    Review of Systems  DATA OBTAINED: from patient, nurse GENERAL:  no fevers, fatigue, appetite changes SKIN: No itching, rash HEENT: No complaint RESPIRATORY: + cough, -wheezing, +SOB CARDIAC: No chest pain, palpitations, lower extremity edema  GI: No abdominal pain, No N/V/D or constipation, No heartburn or reflux  GU: No dysuria, frequency or urgency, or incontinence  MUSCULOSKELETAL: No unrelieved bone/joint pain NEUROLOGIC: No headache, dizziness  PSYCHIATRIC: No overt anxiety or sadness  Vitals:   03/06/16 1422  BP: 108/65    Physical Exam  GENERAL APPEARANCE: Alert, conversant, No acute distress  SKIN: No diaphoresis rash HEENT: Unremarkable RESPIRATORY: Breathing is even, unlabored. Lung sounds are rhonchi, no wheezing   CARDIOVASCULAR: Heart RRR no murmurs, rubs or gallops. No peripheral edema  GASTROINTESTINAL: Abdomen is soft, non-tender, not distended w/ normal bowel sounds.  GENITOURINARY: Bladder non tender, not distended  MUSCULOSKELETAL: No abnormal joints or musculature NEUROLOGIC: Cranial nerves 2-12 grossly intact. Moves all extremities PSYCHIATRIC: Mood and affect appropriate to situation, no behavioral issues  Patient Active Problem List   Diagnosis Date Noted  . Klebsiella cystitis 02/26/2016  . GERD (gastroesophageal reflux disease) 02/26/2016  . Neuropathy due to type 2 diabetes mellitus ([place]) 02/26/2016  . Sepsis ([place]) 12/26/2015  . CAP (community acquired pneumonia) 12/26/2015  .  SOB (shortness of breath) 06/07/2015  . Ulcer of right groin ([place]) 06/02/2015  . Falls frequently 01/05/2015  . Dizziness 01/05/2015  . Candidal intertrigo 01/05/2015  . Diabetes mellitus without complication ([place])   . Depression   . Hypertension   . Recurrent UTI   . Cancer of breast ([place])   . Hyperlipidemia     CBC    Component Value Date/Time   WBC 8.2 02/18/2016   HGB 8.7 (A) 02/18/2016   HCT 28 (A) 02/18/2016   PLT 232 02/18/2016    CMP     Component Value Date/Time   NA 134 (A) 02/19/2016   K 4.5 02/19/2016   BUN 24 (  A) 02/19/2016   CREATININE 0.5 02/19/2016   AST 18 02/15/2016   ALT 5 (A) 02/15/2016   ALKPHOS 94 02/15/2016    Assessment and Plan  COUGH/ HCAP - pt started on alb nebs to help mobilize secretions; mucinex for same; CXR returned with R perihilar infiltrate, have started pt on Augmentin 875 mg BID for 7 days   Time spent > 25 min;> 50% of time with patient was spent reviewing records, labs, tests and studies, counseling and developing plan of care  Webb Silversmith D. Sheppard Coil, MD

## 2016-03-15 ENCOUNTER — Non-Acute Institutional Stay (SKILLED_NURSING_FACILITY): Payer: Medicare Other | Admitting: Internal Medicine

## 2016-03-15 ENCOUNTER — Encounter: Payer: Self-pay | Admitting: Internal Medicine

## 2016-03-15 DIAGNOSIS — J189 Pneumonia, unspecified organism: Secondary | ICD-10-CM

## 2016-03-15 DIAGNOSIS — J441 Chronic obstructive pulmonary disease with (acute) exacerbation: Secondary | ICD-10-CM | POA: Diagnosis not present

## 2016-03-15 NOTE — Progress Notes (Signed)
MRN: [id number] Name: Vickie Murphy  Sex: female Age: 82 y.o. DOB: February 22, 1933  [place] #:  Facility/Room: [facility] / 112 P  Level Of Care: SNF Provider: Noah Delaine. Sheppard Coil, MD Emergency Contacts: Extended Emergency Contact Information Primary Emergency Contact: [name],[name] Address: [address]          [place], [place] [address] [place] Phone: [phone] Relation: Friend Secondary Emergency Contact: [name],[name]          [place], [place] Mobile Phone: [phone] Relation: Daughter  Code Status: DNR  Allergies: Review of patient's allergies indicates no known allergies.  Chief Complaint  Patient presents with  . Acute Visit    Acute    HPI: Patient is 82 y.o. female who was seen for a junky cough with CXR showing R perihilar PNA;tx with mucinex and nebs. Nurse asked me to see her because she is still coughing. Pt says she feels better except for the cough which she is tired of.  Past Medical History:  Diagnosis Date  . Acid reflux   . Breast cancer ([place]) 2002   right  . Cancer [facility])    breast-s/p mastectomy and on chemo  . CHF (congestive heart failure) ([place])    pt reports hx of such  . Depression   . Diabetes mellitus without complication ([place])   . Hyperlipidemia   . Hypertension   . Recurrent UTI     Past Surgical History:  Procedure Laterality Date  . BREAST BIOPSY    . BREAST SURGERY Right   . CARDIAC VALVE REPLACEMENT     aortic, years ago-on no anticoagulation  . KIDNEY STONE SURGERY    . MASTECTOMY Right   . WISDOM TOOTH EXTRACTION        Medication List       Accurate as of 03/15/16  3:29 PM. Always use your most recent med list.          acetaminophen 325 MG tablet Commonly known as:  TYLENOL Take 650 mg by mouth every 6 (six) hours as needed for mild pain or fever.   albuterol (2.5 MG/3ML) 0.083% nebulizer solution Commonly known as:  PROVENTIL Take 2.5 mg by nebulization every 6 (six) hours as  needed for wheezing or shortness of breath.   ascorbic acid 500 MG tablet Commonly known as:  VITAMIN C Take 500 mg by mouth daily.   aspirin EC 81 MG tablet Take 81 mg by mouth daily.   atorvastatin 10 MG tablet Commonly known as:  LIPITOR Take 10 mg by mouth every evening. At night   dextrose 40 % Gel Commonly known as:  GLUTOSE Take 37.5 g (15 g of dextrose total) by mouth every 15 minutes as needed for hypoglycemia   diltiazem 120 MG 24 hr capsule Commonly known as:  CARDIZEM CD Take 120 mg by mouth daily.   docusate sodium 100 MG capsule Commonly known as:  COLACE Take 100 mg by mouth 2 (two) times daily as needed for mild constipation.   FLUoxetine 40 MG capsule Commonly known as:  PROZAC Take 40 mg by mouth daily.   folic acid A999333 MCG tablet Commonly known as:  FOLVITE Take 400 mcg by mouth daily.   gabapentin 300 MG capsule Commonly known as:  NEURONTIN Take 300 mg by mouth 3 (three) times daily.   guaifenesin 100 MG/5ML syrup Commonly known as:  ROBITUSSIN Take 200 mg by mouth every 4 (four) hours as needed for cough.  insulin glargine 100 UNIT/ML injection Commonly known as:  LANTUS Inject 10 Units into the skin at bedtime.   loperamide 2 MG capsule Commonly known as:  IMODIUM Take 2 mg by mouth 2 (two) times daily.   magnesium oxide 400 MG tablet Commonly known as:  MAG-OX Take 400 mg by mouth daily.   meclizine 12.5 MG tablet Commonly known as:  ANTIVERT Take 12.5 mg by mouth 3 (three) times daily as needed for dizziness.   metFORMIN 500 MG tablet Commonly known as:  GLUCOPHAGE Take 500 mg by mouth 2 (two) times daily with a meal.   mirtazapine 15 MG tablet Commonly known as:  REMERON Take 15 mg by mouth at bedtime.   NOVOLOG 100 UNIT/ML injection Generic drug:  insulin aspart Inject into the skin 3 (three) times daily before meals. Administer sq 121-150 = 2 units, 151 -200 = 3 units, 201 - 250 = 5 units, 251 - 300 = 8 units, 301 - 350 =  11 units, 351 - 400 = 15 units, 401 - 450 = 18 units, greater than 450, Call MD   omeprazole 40 MG capsule Commonly known as:  PRILOSEC Take 40 mg by mouth daily.   promethazine 25 MG tablet Commonly known as:  PHENERGAN Take 25 mg by mouth every 6 (six) hours as needed for nausea.   TAB-A-VITE PO Take 1 tablet by mouth daily.   traZODone 100 MG tablet Commonly known as:  DESYREL Take 100 mg by mouth at bedtime.   Vitamin D3 2000 units Tabs Take 2,000 Units by mouth daily. At 6 am       Meds ordered this encounter  Medications  . insulin aspart (NOVOLOG) 100 UNIT/ML injection    Sig: Inject into the skin 3 (three) times daily before meals. Administer sq 121-150 = 2 units, 151 -200 = 3 units, 201 - 250 = 5 units, 251 - 300 = 8 units, 301 - 350 = 11 units, 351 - 400 = 15 units, 401 - 450 = 18 units, greater than 450, Call MD  . loperamide (IMODIUM) 2 MG capsule    Sig: Take 2 mg by mouth 2 (two) times daily.    Immunization History  Administered Date(s) Administered  . PPD Test 12/17/2015    Social History  Substance Use Topics  . Smoking status: Never Smoker  . Smokeless tobacco: Never Used  . Alcohol use No    Review of Systems  DATA OBTAINED: from patient, nurse GENERAL:  no fevers, fatigue, appetite changes SKIN: No itching, rash HEENT: No complaint RESPIRATORY: + cough,- wheezing, -SOB CARDIAC: No chest pain, palpitations, lower extremity edema  GI: No abdominal pain, No N/V/D or constipation, No heartburn or reflux  GU: No dysuria, frequency or urgency, or incontinence  MUSCULOSKELETAL: No unrelieved bone/joint pain NEUROLOGIC: No headache, dizziness  PSYCHIATRIC: No overt anxiety or sadness  Vitals:   03/15/16 1511  BP: (!) 98/52  Pulse: (!) 113  Resp: 20  Temp: 98 F (36.7 C)    Physical Exam  GENERAL APPEARANCE: Alert, conversant, No acute distress  SKIN: No diaphoresis rash HEENT: Unremarkable RESPIRATORY: Breathing is even, unlabored.  Lung sounds are rales R base, no wheezing;pt is coughing during interview   CARDIOVASCULAR: Heart RRR no murmurs, rubs or gallops. No peripheral edema  GASTROINTESTINAL: Abdomen is soft, non-tender, not distended w/ normal bowel sounds.  GENITOURINARY: Bladder non tender, not distended  MUSCULOSKELETAL: No abnormal joints or musculature NEUROLOGIC: Cranial nerves 2-12 grossly intact. Moves all  extremities PSYCHIATRIC: Mood and affect appropriate to situation, no behavioral issues  Patient Active Problem List   Diagnosis Date Noted  . Klebsiella cystitis 02/26/2016  . GERD (gastroesophageal reflux disease) 02/26/2016  . Neuropathy due to type 2 diabetes mellitus ([place]) 02/26/2016  . Sepsis ([place]) 12/26/2015  . CAP (community acquired pneumonia) 12/26/2015  . SOB (shortness of breath) 06/07/2015  . Ulcer of right groin ([place]) 06/02/2015  . Falls frequently 01/05/2015  . Dizziness 01/05/2015  . Candidal intertrigo 01/05/2015  . Diabetes mellitus without complication ([place])   . Depression   . Hypertension   . Recurrent UTI   . Cancer of breast ([place])   . Hyperlipidemia     CBC    Component Value Date/Time   WBC 8.2 02/18/2016   HGB 8.7 (A) 02/18/2016   HCT 28 (A) 02/18/2016   PLT 232 02/18/2016    CMP     Component Value Date/Time   NA 134 (A) 02/19/2016   K 4.5 02/19/2016   BUN 24 (A) 02/19/2016   CREATININE 0.5 02/19/2016   AST 18 02/15/2016   ALT 5 (A) 02/15/2016   ALKPHOS 94 02/15/2016    Assessment and Plan  RLL PNA/ COPD exacerbation- element of COPD?;pt has no pedal edema  And rales on;ly R base where PNA is; have extended scheduled nebs and started steroid taper and hycodan syrup for cough   Time spent > 25 min;> 50% of time with patient was spent reviewing records, labs, tests and studies, counseling and developing plan of care  Webb Silversmith D. Sheppard Coil, MD

## 2016-03-17 ENCOUNTER — Other Ambulatory Visit: Payer: Self-pay | Admitting: *Deleted

## 2016-03-17 MED ORDER — HYDROCODONE-ACETAMINOPHEN 5-325 MG PO TABS: ORAL_TABLET | ORAL | 0 refills | Status: DC

## 2016-03-17 NOTE — Telephone Encounter (Signed)
[facility] #[phone] Fax: [phone] 

## 2016-03-21 ENCOUNTER — Encounter (HOSPITAL_COMMUNITY): Payer: Self-pay

## 2016-03-21 ENCOUNTER — Inpatient Hospital Stay (HOSPITAL_COMMUNITY): Payer: Medicare Other

## 2016-03-21 ENCOUNTER — Emergency Department (HOSPITAL_COMMUNITY): Payer: Medicare Other

## 2016-03-21 ENCOUNTER — Inpatient Hospital Stay (HOSPITAL_COMMUNITY)
Admission: EM | Admit: 2016-03-21 | Discharge: 2016-04-02 | DRG: 377 | Disposition: A | Discharge status: Hospice | Payer: Medicare Other | Attending: Internal Medicine | Admitting: Internal Medicine

## 2016-03-21 DIAGNOSIS — I5033 Acute on chronic diastolic (congestive) heart failure: Secondary | ICD-10-CM | POA: Diagnosis present

## 2016-03-21 DIAGNOSIS — Z7984 Long term (current) use of oral hypoglycemic drugs: Secondary | ICD-10-CM

## 2016-03-21 DIAGNOSIS — E43 Unspecified severe protein-calorie malnutrition: Secondary | ICD-10-CM | POA: Diagnosis present

## 2016-03-21 DIAGNOSIS — D72829 Elevated white blood cell count, unspecified: Secondary | ICD-10-CM | POA: Diagnosis not present

## 2016-03-21 DIAGNOSIS — Z66 Do not resuscitate: Secondary | ICD-10-CM | POA: Diagnosis present

## 2016-03-21 DIAGNOSIS — R0600 Dyspnea, unspecified: Secondary | ICD-10-CM

## 2016-03-21 DIAGNOSIS — D509 Iron deficiency anemia, unspecified: Secondary | ICD-10-CM | POA: Diagnosis present

## 2016-03-21 DIAGNOSIS — Z9011 Acquired absence of right breast and nipple: Secondary | ICD-10-CM

## 2016-03-21 DIAGNOSIS — D124 Benign neoplasm of descending colon: Secondary | ICD-10-CM | POA: Diagnosis present

## 2016-03-21 DIAGNOSIS — R079 Chest pain, unspecified: Secondary | ICD-10-CM

## 2016-03-21 DIAGNOSIS — F0390 Unspecified dementia without behavioral disturbance: Secondary | ICD-10-CM | POA: Diagnosis present

## 2016-03-21 DIAGNOSIS — E119 Type 2 diabetes mellitus without complications: Secondary | ICD-10-CM

## 2016-03-21 DIAGNOSIS — R6510 Systemic inflammatory response syndrome (SIRS) of non-infectious origin without acute organ dysfunction: Secondary | ICD-10-CM | POA: Diagnosis not present

## 2016-03-21 DIAGNOSIS — Z853 Personal history of malignant neoplasm of breast: Secondary | ICD-10-CM

## 2016-03-21 DIAGNOSIS — I959 Hypotension, unspecified: Secondary | ICD-10-CM | POA: Diagnosis not present

## 2016-03-21 DIAGNOSIS — D696 Thrombocytopenia, unspecified: Secondary | ICD-10-CM | POA: Diagnosis present

## 2016-03-21 DIAGNOSIS — F22 Delusional disorders: Secondary | ICD-10-CM

## 2016-03-21 DIAGNOSIS — I509 Heart failure, unspecified: Secondary | ICD-10-CM | POA: Diagnosis not present

## 2016-03-21 DIAGNOSIS — R778 Other specified abnormalities of plasma proteins: Secondary | ICD-10-CM | POA: Diagnosis not present

## 2016-03-21 DIAGNOSIS — A419 Sepsis, unspecified organism: Secondary | ICD-10-CM | POA: Diagnosis not present

## 2016-03-21 DIAGNOSIS — R791 Abnormal coagulation profile: Secondary | ICD-10-CM | POA: Diagnosis not present

## 2016-03-21 DIAGNOSIS — N390 Urinary tract infection, site not specified: Secondary | ICD-10-CM | POA: Diagnosis present

## 2016-03-21 DIAGNOSIS — E876 Hypokalemia: Secondary | ICD-10-CM | POA: Diagnosis present

## 2016-03-21 DIAGNOSIS — J9601 Acute respiratory failure with hypoxia: Secondary | ICD-10-CM | POA: Diagnosis present

## 2016-03-21 DIAGNOSIS — J690 Pneumonitis due to inhalation of food and vomit: Secondary | ICD-10-CM | POA: Diagnosis not present

## 2016-03-21 DIAGNOSIS — I080 Rheumatic disorders of both mitral and aortic valves: Secondary | ICD-10-CM | POA: Diagnosis present

## 2016-03-21 DIAGNOSIS — K7460 Unspecified cirrhosis of liver: Secondary | ICD-10-CM | POA: Diagnosis present

## 2016-03-21 DIAGNOSIS — K921 Melena: Secondary | ICD-10-CM | POA: Diagnosis present

## 2016-03-21 DIAGNOSIS — R109 Unspecified abdominal pain: Secondary | ICD-10-CM

## 2016-03-21 DIAGNOSIS — I110 Hypertensive heart disease with heart failure: Secondary | ICD-10-CM | POA: Diagnosis present

## 2016-03-21 DIAGNOSIS — R7989 Other specified abnormal findings of blood chemistry: Secondary | ICD-10-CM | POA: Diagnosis not present

## 2016-03-21 DIAGNOSIS — J189 Pneumonia, unspecified organism: Secondary | ICD-10-CM | POA: Diagnosis present

## 2016-03-21 DIAGNOSIS — K5790 Diverticulosis of intestine, part unspecified, without perforation or abscess without bleeding: Secondary | ICD-10-CM | POA: Diagnosis present

## 2016-03-21 DIAGNOSIS — I272 Other secondary pulmonary hypertension: Secondary | ICD-10-CM | POA: Diagnosis present

## 2016-03-21 DIAGNOSIS — Z515 Encounter for palliative care: Secondary | ICD-10-CM | POA: Diagnosis not present

## 2016-03-21 DIAGNOSIS — I248 Other forms of acute ischemic heart disease: Secondary | ICD-10-CM | POA: Diagnosis not present

## 2016-03-21 DIAGNOSIS — Z952 Presence of prosthetic heart valve: Secondary | ICD-10-CM

## 2016-03-21 DIAGNOSIS — R7401 Elevation of levels of liver transaminase levels: Secondary | ICD-10-CM

## 2016-03-21 DIAGNOSIS — Z79899 Other long term (current) drug therapy: Secondary | ICD-10-CM

## 2016-03-21 DIAGNOSIS — R740 Nonspecific elevation of levels of transaminase and lactic acid dehydrogenase [LDH]: Secondary | ICD-10-CM | POA: Diagnosis not present

## 2016-03-21 DIAGNOSIS — R0602 Shortness of breath: Secondary | ICD-10-CM | POA: Diagnosis present

## 2016-03-21 DIAGNOSIS — Z794 Long term (current) use of insulin: Secondary | ICD-10-CM

## 2016-03-21 DIAGNOSIS — I214 Non-ST elevation (NSTEMI) myocardial infarction: Secondary | ICD-10-CM | POA: Diagnosis not present

## 2016-03-21 DIAGNOSIS — R627 Adult failure to thrive: Secondary | ICD-10-CM | POA: Diagnosis present

## 2016-03-21 DIAGNOSIS — Z6824 Body mass index (BMI) 24.0-24.9, adult: Secondary | ICD-10-CM

## 2016-03-21 DIAGNOSIS — K219 Gastro-esophageal reflux disease without esophagitis: Secondary | ICD-10-CM | POA: Diagnosis present

## 2016-03-21 DIAGNOSIS — E785 Hyperlipidemia, unspecified: Secondary | ICD-10-CM | POA: Diagnosis present

## 2016-03-21 DIAGNOSIS — Z09 Encounter for follow-up examination after completed treatment for conditions other than malignant neoplasm: Secondary | ICD-10-CM

## 2016-03-21 DIAGNOSIS — D62 Acute posthemorrhagic anemia: Secondary | ICD-10-CM | POA: Diagnosis present

## 2016-03-21 DIAGNOSIS — K5900 Constipation, unspecified: Secondary | ICD-10-CM | POA: Diagnosis present

## 2016-03-21 DIAGNOSIS — F329 Major depressive disorder, single episode, unspecified: Secondary | ICD-10-CM | POA: Diagnosis present

## 2016-03-21 DIAGNOSIS — D649 Anemia, unspecified: Secondary | ICD-10-CM | POA: Diagnosis present

## 2016-03-21 DIAGNOSIS — F418 Other specified anxiety disorders: Secondary | ICD-10-CM | POA: Diagnosis present

## 2016-03-21 DIAGNOSIS — I10 Essential (primary) hypertension: Secondary | ICD-10-CM | POA: Diagnosis present

## 2016-03-21 DIAGNOSIS — I2489 Other forms of acute ischemic heart disease: Secondary | ICD-10-CM | POA: Diagnosis present

## 2016-03-21 HISTORY — DX: Pneumonitis due to inhalation of food and vomit: J69.0

## 2016-03-21 LAB — PROTIME-INR
INR: 1.32
INR: 1.95
PROTHROMBIN TIME: 16.5 s — AB (ref 11.4–15.2)
Prothrombin Time: 22.5 seconds — ABNORMAL HIGH (ref 11.4–15.2)

## 2016-03-21 LAB — BASIC METABOLIC PANEL
ANION GAP: 12 (ref 5–15)
BUN: 48 mg/dL — ABNORMAL HIGH (ref 6–20)
CHLORIDE: 102 mmol/L (ref 101–111)
CO2: 23 mmol/L (ref 22–32)
Calcium: 8.6 mg/dL — ABNORMAL LOW (ref 8.9–10.3)
Creatinine, Ser: 1.04 mg/dL — ABNORMAL HIGH (ref 0.44–1.00)
GFR calc non Af Amer: 49 mL/min — ABNORMAL LOW (ref >60)
GFR, EST AFRICAN AMERICAN: 57 mL/min — AB (ref >60)
Glucose, Bld: 235 mg/dL — ABNORMAL HIGH (ref 65–99)
Potassium: 4.4 mmol/L (ref 3.5–5.1)
Sodium: 137 mmol/L (ref 135–145)

## 2016-03-21 LAB — CBC WITH DIFFERENTIAL/PLATELET
BASOS ABS: 0 10*3/uL (ref 0.0–0.1)
BASOS PCT: 0 %
Eosinophils Absolute: 0.2 10*3/uL (ref 0.0–0.7)
Eosinophils Relative: 1 %
HEMATOCRIT: 26.2 % — AB (ref 36.0–46.0)
HEMOGLOBIN: 8 g/dL — AB (ref 12.0–15.0)
Lymphocytes Relative: 11 %
Lymphs Abs: 1.5 10*3/uL (ref 0.7–4.0)
MCH: 28.9 pg (ref 26.0–34.0)
MCHC: 30.5 g/dL (ref 30.0–36.0)
MCV: 94.6 fL (ref 78.0–100.0)
Monocytes Absolute: 0.4 10*3/uL (ref 0.1–1.0)
Monocytes Relative: 3 %
NEUTROS ABS: 11.6 10*3/uL — AB (ref 1.7–7.7)
NEUTROS PCT: 85 %
Platelets: 247 10*3/uL (ref 150–400)
RBC: 2.77 MIL/uL — AB (ref 3.87–5.11)
RDW: 19.9 % — ABNORMAL HIGH (ref 11.5–15.5)
WBC: 13.6 10*3/uL — ABNORMAL HIGH (ref 4.0–10.5)

## 2016-03-21 LAB — ABO/RH: ABO/RH(D): O POS

## 2016-03-21 LAB — HEMOGLOBIN AND HEMATOCRIT, BLOOD
HCT: 17 % — ABNORMAL LOW (ref 36.0–46.0)
HCT: 16.5 % — ABNORMAL LOW (ref 36.0–46.0)
HEMATOCRIT: 30 % — AB (ref 36.0–46.0)
HEMOGLOBIN: 5.1 g/dL — AB (ref 12.0–15.0)
Hemoglobin: 5.1 g/dL — CL (ref 12.0–15.0)
Hemoglobin: 9.7 g/dL — ABNORMAL LOW (ref 12.0–15.0)

## 2016-03-21 LAB — RETICULOCYTES
RBC.: 1.74 MIL/uL — ABNORMAL LOW (ref 3.87–5.11)
RETIC CT PCT: 11.5 % — AB (ref 0.4–3.1)
Retic Count, Absolute: 200.1 10*3/uL — ABNORMAL HIGH (ref 19.0–186.0)

## 2016-03-21 LAB — TSH: TSH: 1.965 u[IU]/mL (ref 0.350–4.500)

## 2016-03-21 LAB — I-STAT CHEM 8, ED
BUN: 44 mg/dL — AB (ref 6–20)
CHLORIDE: 100 mmol/L — AB (ref 101–111)
CREATININE: 1.1 mg/dL — AB (ref 0.44–1.00)
Calcium, Ion: 1.13 mmol/L (ref 1.12–1.23)
GLUCOSE: 236 mg/dL — AB (ref 65–99)
HCT: 17 % — ABNORMAL LOW (ref 36.0–46.0)
Hemoglobin: 5.8 g/dL — CL (ref 12.0–15.0)
POTASSIUM: 4.3 mmol/L (ref 3.5–5.1)
SODIUM: 137 mmol/L (ref 135–145)
TCO2: 24 mmol/L (ref 0–100)

## 2016-03-21 LAB — ECHOCARDIOGRAM COMPLETE

## 2016-03-21 LAB — GLUCOSE, CAPILLARY
GLUCOSE-CAPILLARY: 302 mg/dL — AB (ref 65–99)
GLUCOSE-CAPILLARY: 389 mg/dL — AB (ref 65–99)
Glucose-Capillary: 251 mg/dL — ABNORMAL HIGH (ref 65–99)

## 2016-03-21 LAB — IRON AND TIBC
IRON: 15 ug/dL — AB (ref 28–170)
SATURATION RATIOS: 5 % — AB (ref 10.4–31.8)
TIBC: 277 ug/dL (ref 250–450)
UIBC: 262 ug/dL

## 2016-03-21 LAB — LACTATE DEHYDROGENASE: LDH: 3389 U/L — AB (ref 98–192)

## 2016-03-21 LAB — FOLATE: Folate: 46.7 ng/mL (ref >5.9)

## 2016-03-21 LAB — VITAMIN B12: Vitamin B-12: 1176 pg/mL — ABNORMAL HIGH (ref 180–914)

## 2016-03-21 LAB — CBG MONITORING, ED: GLUCOSE-CAPILLARY: 316 mg/dL — AB (ref 65–99)

## 2016-03-21 LAB — PREPARE RBC (CROSSMATCH)

## 2016-03-21 LAB — FERRITIN: FERRITIN: 279 ng/mL (ref 11–307)

## 2016-03-21 LAB — BRAIN NATRIURETIC PEPTIDE: B NATRIURETIC PEPTIDE 5: 585.5 pg/mL — AB (ref 0.0–100.0)

## 2016-03-21 MED ORDER — LOPERAMIDE HCL 2 MG PO CAPS
2.0000 mg | ORAL_CAPSULE | Freq: Two times a day (BID) | ORAL | Status: DC
Start: 2016-03-21 — End: 2016-03-22
  Administered 2016-03-21: 2 mg via ORAL
  Filled 2016-03-21: qty 1

## 2016-03-21 MED ORDER — VANCOMYCIN HCL IN DEXTROSE 1-5 GM/200ML-% IV SOLN
1000.0000 mg | Freq: Once | INTRAVENOUS | Status: AC
  Administered 2016-03-21: 1000 mg via INTRAVENOUS
  Filled 2016-03-21: qty 200

## 2016-03-21 MED ORDER — ONDANSETRON HCL 4 MG/2ML IJ SOLN
INTRAMUSCULAR | Status: AC
  Filled 2016-03-21: qty 2

## 2016-03-21 MED ORDER — PANTOPRAZOLE SODIUM 40 MG PO TBEC: 40.0000 mg | DELAYED_RELEASE_TABLET | Freq: Every day | ORAL | Status: DC

## 2016-03-21 MED ORDER — VANCOMYCIN HCL IN DEXTROSE 750-5 MG/150ML-% IV SOLN
750.0000 mg | INTRAVENOUS | Status: DC
  Administered 2016-03-22 – 2016-03-23 (×2): 750 mg via INTRAVENOUS
  Filled 2016-03-21 (×5): qty 150

## 2016-03-21 MED ORDER — SODIUM CHLORIDE 0.9 % IV SOLN
Freq: Once | INTRAVENOUS | Status: AC
  Administered 2016-03-21: 12:00:00 via INTRAVENOUS

## 2016-03-21 MED ORDER — FLUOXETINE HCL 20 MG PO CAPS
60.0000 mg | ORAL_CAPSULE | Freq: Every day | ORAL | Status: DC
  Administered 2016-03-21 – 2016-04-01 (×10): 60 mg via ORAL
  Filled 2016-03-21 (×10): qty 3

## 2016-03-21 MED ORDER — PIPERACILLIN-TAZOBACTAM 3.375 G IVPB 30 MIN
3.3750 g | Freq: Once | INTRAVENOUS | Status: AC
  Administered 2016-03-21: 3.375 g via INTRAVENOUS
  Filled 2016-03-21: qty 50

## 2016-03-21 MED ORDER — METOPROLOL TARTRATE 25 MG PO TABS
25.0000 mg | ORAL_TABLET | Freq: Two times a day (BID) | ORAL | Status: DC
  Administered 2016-03-22 – 2016-04-01 (×16): 25 mg via ORAL
  Filled 2016-03-21 (×19): qty 1

## 2016-03-21 MED ORDER — SODIUM CHLORIDE 0.9 % IV SOLN
10.0000 mL/h | Freq: Once | INTRAVENOUS | Status: AC
  Administered 2016-03-24: 10 mL/h via INTRAVENOUS

## 2016-03-21 MED ORDER — ALPRAZOLAM 0.5 MG PO TABS
0.5000 mg | ORAL_TABLET | Freq: Two times a day (BID) | ORAL | Status: DC | PRN
  Administered 2016-03-25 – 2016-04-02 (×8): 0.5 mg via ORAL
  Filled 2016-03-21 (×8): qty 1

## 2016-03-21 MED ORDER — INSULIN ASPART 100 UNIT/ML ~~LOC~~ SOLN
0.0000 [IU] | Freq: Three times a day (TID) | SUBCUTANEOUS | Status: DC
  Administered 2016-03-21: 5 [IU] via SUBCUTANEOUS
  Administered 2016-03-21 – 2016-03-22 (×2): 7 [IU] via SUBCUTANEOUS
  Administered 2016-03-22: 3 [IU] via SUBCUTANEOUS
  Administered 2016-03-23: 5 [IU] via SUBCUTANEOUS
  Administered 2016-03-23: 2 [IU] via SUBCUTANEOUS
  Administered 2016-03-25: 1 [IU] via SUBCUTANEOUS
  Administered 2016-03-25: 3 [IU] via SUBCUTANEOUS
  Administered 2016-03-25 – 2016-03-26 (×2): 7 [IU] via SUBCUTANEOUS
  Administered 2016-03-26: 2 [IU] via SUBCUTANEOUS
  Administered 2016-03-26: 7 [IU] via SUBCUTANEOUS
  Administered 2016-03-27: 1 [IU] via SUBCUTANEOUS
  Administered 2016-03-27: 3 [IU] via SUBCUTANEOUS
  Administered 2016-03-27 – 2016-03-28 (×3): 2 [IU] via SUBCUTANEOUS
  Administered 2016-03-29: 1 [IU] via SUBCUTANEOUS
  Administered 2016-03-29: 3 [IU] via SUBCUTANEOUS
  Administered 2016-03-30: 2 [IU] via SUBCUTANEOUS
  Administered 2016-03-30: 3 [IU] via SUBCUTANEOUS
  Administered 2016-03-31: 1 [IU] via SUBCUTANEOUS
  Administered 2016-03-31: 2 [IU] via SUBCUTANEOUS

## 2016-03-21 MED ORDER — FENTANYL CITRATE (PF) 100 MCG/2ML IJ SOLN
100.0000 ug | Freq: Once | INTRAMUSCULAR | Status: AC
  Administered 2016-03-21: 100 ug via INTRAVENOUS
  Filled 2016-03-21: qty 2

## 2016-03-21 MED ORDER — IOPAMIDOL (ISOVUE-300) INJECTION 61%
100.0000 mL | Freq: Once | INTRAVENOUS | Status: AC | PRN
  Administered 2016-03-21: 100 mL via INTRAVENOUS

## 2016-03-21 MED ORDER — MIRTAZAPINE 15 MG PO TABS
15.0000 mg | ORAL_TABLET | Freq: Every day | ORAL | Status: DC
  Administered 2016-03-21 – 2016-04-01 (×12): 15 mg via ORAL
  Filled 2016-03-21 (×12): qty 1

## 2016-03-21 MED ORDER — MAGNESIUM SULFATE IN D5W 1-5 GM/100ML-% IV SOLN
1.0000 g | Freq: Once | INTRAVENOUS | Status: AC
  Administered 2016-03-21: 1 g via INTRAVENOUS
  Filled 2016-03-21: qty 100

## 2016-03-21 MED ORDER — SODIUM CHLORIDE 0.9 % IV BOLUS (SEPSIS)
500.0000 mL | Freq: Once | INTRAVENOUS | Status: AC
  Administered 2016-03-21: 500 mL via INTRAVENOUS

## 2016-03-21 MED ORDER — ATORVASTATIN CALCIUM 10 MG PO TABS
10.0000 mg | ORAL_TABLET | Freq: Every evening | ORAL | Status: DC
  Administered 2016-03-23 – 2016-03-28 (×6): 10 mg via ORAL
  Filled 2016-03-21 (×8): qty 1

## 2016-03-21 MED ORDER — FUROSEMIDE 10 MG/ML IJ SOLN: 40.0000 mg | Freq: Two times a day (BID) | INTRAMUSCULAR | Status: DC

## 2016-03-21 MED ORDER — POLYETHYLENE GLYCOL 3350 17 G PO PACK
17.0000 g | PACK | Freq: Two times a day (BID) | ORAL | Status: DC
  Administered 2016-03-21 – 2016-03-31 (×15): 17 g via ORAL
  Filled 2016-03-21 (×17): qty 1

## 2016-03-21 MED ORDER — PANTOPRAZOLE SODIUM 40 MG IV SOLR
40.0000 mg | Freq: Two times a day (BID) | INTRAVENOUS | Status: DC
  Administered 2016-03-24 – 2016-03-27 (×6): 40 mg via INTRAVENOUS
  Filled 2016-03-21 (×6): qty 40

## 2016-03-21 MED ORDER — HYDRALAZINE HCL 20 MG/ML IJ SOLN: 10.0000 mg | Freq: Four times a day (QID) | INTRAMUSCULAR | Status: DC | PRN

## 2016-03-21 MED ORDER — PEG 3350-KCL-NA BICARB-NACL 420 G PO SOLR
4000.0000 mL | Freq: Once | ORAL | Status: AC
  Administered 2016-03-21: 4000 mL via ORAL
  Filled 2016-03-21: qty 4000

## 2016-03-21 MED ORDER — SODIUM CHLORIDE 0.9 % IV SOLN
80.0000 mg | Freq: Once | INTRAVENOUS | Status: AC
  Administered 2016-03-21: 80 mg via INTRAVENOUS
  Filled 2016-03-21: qty 80

## 2016-03-21 MED ORDER — HYDROCODONE-ACETAMINOPHEN 5-325 MG PO TABS
1.0000 | ORAL_TABLET | ORAL | Status: DC | PRN
  Administered 2016-03-21 – 2016-03-27 (×4): 1 via ORAL
  Administered 2016-03-28 – 2016-03-29 (×3): 2 via ORAL
  Administered 2016-03-30 – 2016-04-01 (×3): 1 via ORAL
  Filled 2016-03-21 (×2): qty 2
  Filled 2016-03-21 (×7): qty 1
  Filled 2016-03-21: qty 2

## 2016-03-21 MED ORDER — FUROSEMIDE 10 MG/ML IJ SOLN
40.0000 mg | Freq: Once | INTRAMUSCULAR | Status: DC
  Filled 2016-03-21: qty 4

## 2016-03-21 MED ORDER — DILTIAZEM HCL ER COATED BEADS 120 MG PO CP24
120.0000 mg | ORAL_CAPSULE | Freq: Every day | ORAL | Status: DC
  Administered 2016-03-21 – 2016-04-01 (×10): 120 mg via ORAL
  Filled 2016-03-21 (×10): qty 1

## 2016-03-21 MED ORDER — DOCUSATE SODIUM 100 MG PO CAPS
100.0000 mg | ORAL_CAPSULE | Freq: Two times a day (BID) | ORAL | Status: DC | PRN
  Administered 2016-03-24 – 2016-03-30 (×2): 100 mg via ORAL
  Filled 2016-03-21 (×2): qty 1

## 2016-03-21 MED ORDER — DIPHENHYDRAMINE HCL 50 MG/ML IJ SOLN: 25.0000 mg | Freq: Four times a day (QID) | INTRAMUSCULAR | Status: DC | PRN

## 2016-03-21 MED ORDER — SODIUM CHLORIDE 0.9 % IV SOLN
8.0000 mg/h | INTRAVENOUS | Status: DC
  Administered 2016-03-21 – 2016-03-24 (×6): 8 mg/h via INTRAVENOUS
  Filled 2016-03-21 (×11): qty 80

## 2016-03-21 MED ORDER — FUROSEMIDE 10 MG/ML IJ SOLN
40.0000 mg | Freq: Once | INTRAVENOUS | Status: AC
  Administered 2016-03-21: 40 mg via INTRAVENOUS
  Filled 2016-03-21: qty 4

## 2016-03-21 MED ORDER — SODIUM CHLORIDE 0.9 % IV SOLN
INTRAVENOUS | Status: DC
  Administered 2016-03-21: via INTRAVENOUS

## 2016-03-21 MED ORDER — ACETAMINOPHEN 325 MG PO TABS: 650.0000 mg | ORAL_TABLET | Freq: Four times a day (QID) | ORAL | Status: DC | PRN

## 2016-03-21 MED ORDER — ASPIRIN EC 81 MG PO TBEC
81.0000 mg | DELAYED_RELEASE_TABLET | Freq: Every day | ORAL | Status: DC
  Administered 2016-03-21 – 2016-04-01 (×11): 81 mg via ORAL
  Filled 2016-03-21 (×11): qty 1

## 2016-03-21 MED ORDER — ALBUTEROL SULFATE (2.5 MG/3ML) 0.083% IN NEBU
2.5000 mg | INHALATION_SOLUTION | Freq: Four times a day (QID) | RESPIRATORY_TRACT | Status: DC | PRN
  Administered 2016-03-26 – 2016-03-27 (×2): 2.5 mg via RESPIRATORY_TRACT
  Filled 2016-03-21 (×3): qty 3

## 2016-03-21 MED ORDER — INSULIN GLARGINE 100 UNIT/ML ~~LOC~~ SOLN
10.0000 [IU] | Freq: Every day | SUBCUTANEOUS | Status: DC
  Administered 2016-03-21 – 2016-03-25 (×5): 10 [IU] via SUBCUTANEOUS
  Filled 2016-03-21 (×6): qty 0.1

## 2016-03-21 MED ORDER — ONDANSETRON HCL 4 MG/2ML IJ SOLN
4.0000 mg | Freq: Four times a day (QID) | INTRAMUSCULAR | Status: DC | PRN
  Administered 2016-03-21 – 2016-03-27 (×4): 4 mg via INTRAVENOUS
  Filled 2016-03-21 (×3): qty 2

## 2016-03-21 MED ORDER — PIPERACILLIN-TAZOBACTAM 3.375 G IVPB
3.3750 g | Freq: Three times a day (TID) | INTRAVENOUS | Status: DC
  Administered 2016-03-21 – 2016-03-24 (×9): 3.375 g via INTRAVENOUS
  Filled 2016-03-21 (×8): qty 50

## 2016-03-21 MED ORDER — PANTOPRAZOLE SODIUM 40 MG PO TBEC: 40.0000 mg | DELAYED_RELEASE_TABLET | Freq: Two times a day (BID) | ORAL | Status: DC

## 2016-03-21 MED ORDER — VITAMIN K1 10 MG/ML IJ SOLN
5.0000 mg | Freq: Once | INTRAMUSCULAR | Status: AC
  Administered 2016-03-21: 5 mg via INTRAVENOUS
  Filled 2016-03-21: qty 0.5

## 2016-03-21 MED ORDER — FUROSEMIDE 10 MG/ML IJ SOLN
20.0000 mg | Freq: Two times a day (BID) | INTRAMUSCULAR | Status: DC
  Administered 2016-03-21 – 2016-03-23 (×4): 20 mg via INTRAVENOUS
  Filled 2016-03-21 (×4): qty 2

## 2016-03-21 MED ORDER — TRAZODONE HCL 50 MG PO TABS
100.0000 mg | ORAL_TABLET | Freq: Every day | ORAL | Status: DC
  Administered 2016-03-21 – 2016-04-01 (×12): 100 mg via ORAL
  Filled 2016-03-21 (×12): qty 2

## 2016-03-21 MED ORDER — HEPARIN SODIUM (PORCINE) 5000 UNIT/ML IJ SOLN
5000.0000 [IU] | Freq: Three times a day (TID) | INTRAMUSCULAR | Status: DC
  Administered 2016-03-21 – 2016-03-22 (×3): 5000 [IU] via SUBCUTANEOUS
  Filled 2016-03-21 (×3): qty 1

## 2016-03-21 MED ORDER — SODIUM CHLORIDE 0.9 % IV BOLUS (SEPSIS)
2000.0000 mL | Freq: Once | INTRAVENOUS | Status: AC
  Administered 2016-03-21: 2000 mL via INTRAVENOUS

## 2016-03-21 NOTE — ED Notes (Signed)
Pt can go to floor at 08:25. Vickie Murphy

## 2016-03-21 NOTE — ED Provider Notes (Signed)
[facility] DEPT Provider Note   CSN: [id number] Arrival date & time: 03/21/16  0444     History   Chief Complaint Chief Complaint  Patient presents with  . Abnormal Lab    HPI Vickie Murphy is a 82 y.o. female PMH of CHF, HTN, HLD, here with abnormal lab value.  She comes from a skilled facility and is not able to give much history.  She states her entire body hurts and she feels as if she is going to die.  EMS states her hemoglobin was found to be low.  Patient denies seeing blood in her stool or black stool. She does not take any blood thinners.  No further history could be obtained.    HPI  Past Medical History:  Diagnosis Date  . Acid reflux   . Breast cancer ([place]) 2002   right  . Cancer [facility])    breast-s/p mastectomy and on chemo  . CHF (congestive heart failure) ([place])    pt reports hx of such  . Depression   . Diabetes mellitus without complication ([place])   . Hyperlipidemia   . Hypertension   . Recurrent UTI     Patient Active Problem List   Diagnosis Date Noted  . Klebsiella cystitis 02/26/2016  . GERD (gastroesophageal reflux disease) 02/26/2016  . Neuropathy due to type 2 diabetes mellitus ([place]) 02/26/2016  . Sepsis ([place]) 12/26/2015  . CAP (community acquired pneumonia) 12/26/2015  . SOB (shortness of breath) 06/07/2015  . Ulcer of right groin ([place]) 06/02/2015  . Falls frequently 01/05/2015  . Dizziness 01/05/2015  . Candidal intertrigo 01/05/2015  . Diabetes mellitus without complication ([place])   . Depression   . Hypertension   . Recurrent UTI   . Cancer of breast ([place])   . Hyperlipidemia     Past Surgical History:  Procedure Laterality Date  . BREAST BIOPSY    . BREAST SURGERY Right   . CARDIAC VALVE REPLACEMENT     aortic, years ago-on no anticoagulation  . KIDNEY STONE SURGERY    . MASTECTOMY Right   . WISDOM TOOTH EXTRACTION      OB History    No data available       Home Medications    Prior to Admission medications     Medication Sig Start Date End Date Taking? Authorizing Provider  acetaminophen (TYLENOL) 325 MG tablet Take 650 mg by mouth every 6 (six) hours as needed for mild pain or fever.   Yes Historical Provider, MD  albuterol (PROVENTIL) (2.5 MG/3ML) 0.083% nebulizer solution Take 2.5 mg by nebulization every 6 (six) hours as needed for wheezing or shortness of breath.   Yes Historical Provider, MD  ALPRAZolam Duanne Moron) 0.5 MG tablet Take 0.5 mg by mouth 2 (two) times daily as needed for anxiety.   Yes Historical Provider, MD  ascorbic acid (VITAMIN C) 500 MG tablet Take 500 mg by mouth daily.   Yes Historical Provider, MD  aspirin EC 81 MG tablet Take 81 mg by mouth daily.   Yes Historical Provider, MD  atorvastatin (LIPITOR) 10 MG tablet Take 10 mg by mouth every evening. At night   Yes Historical Provider, MD  Cholecalciferol (VITAMIN D3) 2000 units TABS Take 2,000 Units by mouth daily. At 6 am   Yes Historical Provider, MD  dextromethorphan-guaiFENesin [facility] DM) 30-600 MG 12hr tablet Take 1 tablet by mouth 2 (two) times daily.   Yes Historical Provider, MD  dextrose (GLUTOSE) 40 % GEL Take 37.5 g (15 g  of dextrose total) by mouth every 15 minutes as needed for hypoglycemia   Yes Historical Provider, MD  diltiazem (CARDIZEM CD) 120 MG 24 hr capsule Take 120 mg by mouth daily.   Yes Historical Provider, MD  docusate sodium (COLACE) 100 MG capsule Take 100 mg by mouth 2 (two) times daily as needed for mild constipation.   Yes Historical Provider, MD  FLUoxetine (PROZAC) 40 MG capsule Take 60 mg by mouth daily.    Yes Historical Provider, MD  folic acid (FOLVITE) A999333 MCG tablet Take 400 mcg by mouth daily.   Yes Historical Provider, MD  gabapentin (NEURONTIN) 300 MG capsule Take 300 mg by mouth 3 (three) times daily.   Yes Historical Provider, MD  guaifenesin (ROBITUSSIN) 100 MG/5ML syrup Take 200 mg by mouth every 4 (four) hours as needed for cough.   Yes Historical Provider, MD   HYDROcodone-acetaminophen (NORCO/VICODIN) 5-325 MG tablet Take one tablet by mouth three times daily for pain Patient taking differently: Take 1 tablet by mouth 2 (two) times daily.  03/17/16  Yes Monica Carter, DO  insulin glargine (LANTUS) 100 UNIT/ML injection Inject 10 Units into the skin at bedtime.   Yes Historical Provider, MD  insulin lispro (HUMALOG) 100 UNIT/ML injection Inject 2-15 Units into the skin 4 (four) times daily -  with meals and at bedtime. PER SLIDING SCALE:  121-150= 2 units 151-200= 3 units 201-250= 5 units 251-300= 8 units 301-350= 11 units 351-400= 15 units 401-450= 18 units  > Greater than 450 CALL MD   Yes Historical Provider, MD  loperamide (IMODIUM) 2 MG capsule Take 2 mg by mouth 2 (two) times daily.   Yes Historical Provider, MD  magnesium oxide (MAG-OX) 400 MG tablet Take 400 mg by mouth daily.    Yes Historical Provider, MD  meclizine (ANTIVERT) 12.5 MG tablet Take 12.5 mg by mouth 3 (three) times daily as needed for dizziness.    Yes Historical Provider, MD  metFORMIN (GLUCOPHAGE) 500 MG tablet Take 500 mg by mouth 2 (two) times daily with a meal.   Yes Historical Provider, MD  mirtazapine (REMERON) 15 MG tablet Take 15 mg by mouth at bedtime.   Yes Historical Provider, MD  moxifloxacin (AVELOX) 400 MG tablet Take 400 mg by mouth daily at 8 pm.   Yes Historical Provider, MD  Multiple Vitamin (TAB-A-VITE PO) Take 1 tablet by mouth daily.   Yes Historical Provider, MD  omeprazole (PRILOSEC) 40 MG capsule Take 40 mg by mouth daily.   Yes Historical Provider, MD  promethazine (PHENERGAN) 25 MG tablet Take 25 mg by mouth every 6 (six) hours as needed for nausea.   Yes Historical Provider, MD  traZODone (DESYREL) 100 MG tablet Take 100 mg by mouth at bedtime.   Yes Historical Provider, MD    Family History Family History  Problem Relation Age of Onset  . Cancer Father   . Drug abuse Brother   . Breast cancer Neg Hx     Social History Social History   Substance Use Topics  . Smoking status: Never Smoker  . Smokeless tobacco: Never Used  . Alcohol use No     Allergies   Review of patient's allergies indicates no known allergies.   Review of Systems Review of Systems  Unable to perform ROS: Acuity of condition     Physical Exam Updated Vital Signs Pulse 112   Temp 97.6 F (36.4 C) (Oral)   Resp 18   SpO2 91%   Physical  Exam  Constitutional: She is oriented to person, place, and time. She appears well-developed and well-nourished. She appears distressed.  Patient appears pale and distressed  HENT:  Head: Normocephalic and atraumatic.  Nose: Nose normal.  Mouth/Throat: Oropharynx is clear and moist. No oropharyngeal exudate.  Eyes: EOM are normal. Pupils are equal, round, and reactive to light. No scleral icterus.  Pale conjunctiva bialterally  Neck: Normal range of motion. Neck supple. No JVD present. No tracheal deviation present. No thyromegaly present.  Cardiovascular: Regular rhythm and normal heart sounds.  Exam reveals no gallop and no friction rub.   No murmur heard. tachycardic  Pulmonary/Chest: She is in respiratory distress. She has no wheezes. She exhibits no tenderness.  Tachypnea, increased work of breathing, use of accessory muscles  R side s/p masectomy  Abdominal: Soft. Bowel sounds are normal. She exhibits no distension and no mass. There is no tenderness. There is no rebound and no guarding.  Genitourinary: Rectal exam shows guaiac negative stool.  Musculoskeletal: Normal range of motion. She exhibits no edema or tenderness.  Lymphadenopathy:    She has no cervical adenopathy.  Neurological: She is alert and oriented to person, place, and time. No cranial nerve deficit. She exhibits normal muscle tone.  Skin: Skin is warm and dry. No rash noted. No erythema. No pallor.  Nursing note and vitals reviewed.    ED Treatments / Results  Labs (all labs ordered are listed, but only abnormal results  are displayed) Labs Reviewed  CBC WITH DIFFERENTIAL/PLATELET - Abnormal; Notable for the following:       Result Value   WBC 13.6 (*)    RBC 2.77 (*)    Hemoglobin 8.0 (*)    HCT 26.2 (*)    RDW 19.9 (*)    Neutro Abs 11.6 (*)    All other components within normal limits  PROTIME-INR - Abnormal; Notable for the following:    Prothrombin Time 16.5 (*)    All other components within normal limits  BASIC METABOLIC PANEL  I-STAT CHEM 8, ED  POC OCCULT BLOOD, ED  TYPE AND SCREEN  PREPARE RBC (CROSSMATCH)    EKG  EKG Interpretation  Date/Time:  Tuesday March 21 2016 05:12:42 EDT Ventricular Rate:  115 PR Interval:    QRS Duration: 90 QT Interval:  344 QTC Calculation: 476 R Axis:   42 Text Interpretation:  Sinus tachycardia Prolonged PR interval Low voltage, precordial leads Baseline wander in lead(s) I II aVR aVF No old tracing to compare Confirmed by Glynn Octave [phone]) on 03/21/2016 5:27:27 AM       Radiology No results found.  Procedures Procedures (including critical care time)  Medications Ordered in ED Medications  0.9 %  sodium chloride infusion (not administered)  fentaNYL (SUBLIMAZE) injection 100 mcg (not administered)  sodium chloride 0.9 % bolus 2,000 mL (2,000 mLs Intravenous New Bag/Given 03/21/16 0547)     Initial Impression / Assessment and Plan / ED Course  I have reviewed the triage vital signs and the nursing notes.  Pertinent labs & imaging results that were available during my care of the patient were reviewed by me and considered in my medical decision making (see chart for details).  Clinical Course    Patient presents to the ED for anemia.  Will recheck labs here as well as type and screen.  She was given 2L IVF for tachycardia.  Patient was a difficult stick, I obtained IV via [place].  6:06 AM Hgb is 8.0, hemoccult  is negative.  Patient will require admission for symptomatic anemia.  7:25 AM CXR reveals fluid overload with  possible pneumonia.  Will order abx and call hospitalist for admission.  Final Clinical Impressions(s) / ED Diagnoses   Final diagnoses:  None    New Prescriptions New Prescriptions   No medications on file     Everlene Balls, MD 03/21/16 [id number]

## 2016-03-21 NOTE — Progress Notes (Signed)
Patient became unresponsive while trying to move her bowel. Pt became responsive after sternal rub. Rapid response nurse was notified and at bedside to assess patient. Dr. Candiss Norse, MD was notified. New orders were obtained and carried out. Will continue to monitor patient.

## 2016-03-21 NOTE — Consult Note (Signed)
Reason for Consult: Anemia Referring Physician: [facility]  Mosie Epstein HPI: This is an 82 year old female with a PMH of DM, CHF, dementia, s/p right mastectomy for breast cancer, GERD, and pneumonia admitted for an anemia.  Her HGB was identified to be in the 5.1 g/dL when she was at the Nursing home.  Subsequently she was sent to the ER.  Per the chart, two weeks ago she underwent an EGD for complaints about nausea, but the examination was unremarkable.  There is no history of a colonoscopy.  Past Medical History:  Diagnosis Date  . Acid reflux   . Breast cancer ([place]) 2002   right  . Cancer [facility])    breast-s/p mastectomy and on chemo  . CHF (congestive heart failure) ([place])    pt reports hx of such  . Depression   . Diabetes mellitus without complication ([place])   . Hyperlipidemia   . Hypertension   . Recurrent UTI     Past Surgical History:  Procedure Laterality Date  . BREAST BIOPSY    . BREAST SURGERY Right   . CARDIAC VALVE REPLACEMENT     aortic, years ago-on no anticoagulation  . KIDNEY STONE SURGERY    . MASTECTOMY Right   . WISDOM TOOTH EXTRACTION      Family History  Problem Relation Age of Onset  . Cancer Father   . Drug abuse Brother   . Breast cancer Neg Hx     Social History:  reports that she has never smoked. She has never used smokeless tobacco. She reports that she does not drink alcohol or use drugs.  Allergies: No Known Allergies  Medications:  Scheduled: . sodium chloride  10 mL/hr Intravenous Once  . aspirin EC  81 mg Oral Daily  . atorvastatin  10 mg Oral QPM  . diltiazem  120 mg Oral Daily  . FLUoxetine  60 mg Oral Daily  . furosemide  40 mg Intravenous BID  . heparin  5,000 Units Subcutaneous Q8H  . insulin aspart  0-9 Units Subcutaneous TID WC  . insulin glargine  10 Units Subcutaneous QHS  . loperamide  2 mg Oral BID  . magnesium sulfate 1 - 4 g bolus IVPB  1 g Intravenous Once  . mirtazapine  15 mg Oral QHS  . [START ON  03/24/2016] pantoprazole  40 mg Intravenous Q12H  . piperacillin-tazobactam (ZOSYN)  IV  3.375 g Intravenous Q8H  . polyethylene glycol  17 g Oral BID  . traZODone  100 mg Oral QHS  . [START ON 03/22/2016] vancomycin  750 mg Intravenous Q24H   Continuous: . pantoprozole (PROTONIX) infusion 8 mg/hr (03/21/16 1309)    Results for orders placed or performed during the hospital encounter of 03/21/16 (from the past 24 hour(s))  CBC with Differential/Platelet     Status: Abnormal   Collection Time: 03/21/16  5:28 AM  Result Value Ref Range   WBC 13.6 (H) 4.0 - 10.5 K/uL   RBC 2.77 (L) 3.87 - 5.11 MIL/uL   Hemoglobin 8.0 (L) 12.0 - 15.0 g/dL   HCT 26.2 (L) 36.0 - 46.0 %   MCV 94.6 78.0 - 100.0 fL   MCH 28.9 26.0 - 34.0 pg   MCHC 30.5 30.0 - 36.0 g/dL   RDW 19.9 (H) 11.5 - 15.5 %   Platelets 247 150 - 400 K/uL   Neutrophils Relative % 85 %   Neutro Abs 11.6 (H) 1.7 - 7.7 K/uL   Lymphocytes Relative 11 %  Lymphs Abs 1.5 0.7 - 4.0 K/uL   Monocytes Relative 3 %   Monocytes Absolute 0.4 0.1 - 1.0 K/uL   Eosinophils Relative 1 %   Eosinophils Absolute 0.2 0.0 - 0.7 K/uL   Basophils Relative 0 %   Basophils Absolute 0.0 0.0 - 0.1 K/uL  Basic metabolic panel     Status: Abnormal   Collection Time: 03/21/16  5:28 AM  Result Value Ref Range   Sodium 137 135 - 145 mmol/L   Potassium 4.4 3.5 - 5.1 mmol/L   Chloride 102 101 - 111 mmol/L   CO2 23 22 - 32 mmol/L   Glucose, Bld 235 (H) 65 - 99 mg/dL   BUN 48 (H) 6 - 20 mg/dL   Creatinine, Ser 1.04 (H) 0.44 - 1.00 mg/dL   Calcium 8.6 (L) 8.9 - 10.3 mg/dL   GFR calc non Af Amer 49 (L) >60 mL/min   GFR calc Af Amer 57 (L) >60 mL/min   Anion gap 12 5 - 15  Type and screen     Status: None (Preliminary result)   Collection Time: 03/21/16  5:28 AM  Result Value Ref Range   ABO/RH(D) O POS    Antibody Screen NEG    Sample Expiration 03/24/2016    Unit Number [id number]    Blood Component Type RED CELLS,LR    Unit division 00    Status of  Unit ALLOCATED    Transfusion Status OK TO TRANSFUSE    Crossmatch Result Compatible    Unit Number [id number]    Blood Component Type RED CELLS,LR    Unit division 00    Status of Unit ALLOCATED    Transfusion Status OK TO TRANSFUSE    Crossmatch Result Compatible   Protime-INR     Status: Abnormal   Collection Time: 03/21/16  5:28 AM  Result Value Ref Range   Prothrombin Time 16.5 (H) 11.4 - 15.2 seconds   INR 1.32   Prepare RBC     Status: None   Collection Time: 03/21/16  5:28 AM  Result Value Ref Range   Order Confirmation ORDER PROCESSED BY BLOOD BANK   ABO/Rh     Status: None   Collection Time: 03/21/16  5:28 AM  Result Value Ref Range   ABO/RH(D) O POS   Brain natriuretic peptide     Status: Abnormal   Collection Time: 03/21/16  5:28 AM  Result Value Ref Range   B Natriuretic Peptide 585.5 (H) 0.0 - 100.0 pg/mL  I-stat chem 8, ed     Status: Abnormal   Collection Time: 03/21/16  6:00 AM  Result Value Ref Range   Sodium 137 135 - 145 mmol/L   Potassium 4.3 3.5 - 5.1 mmol/L   Chloride 100 (L) 101 - 111 mmol/L   BUN 44 (H) 6 - 20 mg/dL   Creatinine, Ser 1.10 (H) 0.44 - 1.00 mg/dL   Glucose, Bld 236 (H) 65 - 99 mg/dL   Calcium, Ion 1.13 1.12 - 1.23 mmol/L   TCO2 24 0 - 100 mmol/L   Hemoglobin 5.8 (LL) 12.0 - 15.0 g/dL   HCT 17.0 (L) 36.0 - 46.0 %   Comment NOTIFIED PHYSICIAN   Reticulocytes     Status: Abnormal   Collection Time: 03/21/16  8:08 AM  Result Value Ref Range   Retic Ct Pct 11.5 (H) 0.4 - 3.1 %   RBC. 1.74 (L) 3.87 - 5.11 MIL/uL   Retic Count, Manual 200.1 (H) 19.0 - 186.0 K/uL  CBG monitoring, ED     Status: Abnormal   Collection Time: 03/21/16  8:33 AM  Result Value Ref Range   Glucose-Capillary 316 (H) 65 - 99 mg/dL  Hemoglobin and hematocrit, blood     Status: Abnormal   Collection Time: 03/21/16  8:36 AM  Result Value Ref Range   Hemoglobin 5.1 (LL) 12.0 - 15.0 g/dL   HCT 17.0 (L) 36.0 - 46.0 %  Hemoglobin and hematocrit, blood      Status: Abnormal   Collection Time: 03/21/16 11:20 AM  Result Value Ref Range   Hemoglobin 5.1 (LL) 12.0 - 15.0 g/dL   HCT 16.5 (L) 36.0 - 46.0 %  Protime-INR     Status: Abnormal   Collection Time: 03/21/16 11:20 AM  Result Value Ref Range   Prothrombin Time 22.5 (H) 11.4 - 15.2 seconds   INR 1.95      Ct Abdomen Pelvis W Contrast  Result Date: 03/21/2016 CLINICAL DATA:  Low hemoglobin and weakness with nausea and history of breast carcinoma EXAM: CT ABDOMEN AND PELVIS WITH CONTRAST TECHNIQUE: Multidetector CT imaging of the abdomen and pelvis was performed using the standard protocol following bolus administration of intravenous contrast. CONTRAST:  170mL ISOVUE-300 IOPAMIDOL (ISOVUE-300) INJECTION 61% COMPARISON:  None. FINDINGS: Lower chest: Small right-sided pleural effusion is noted. Diffuse fibrotic changes are noted in the bases bilaterally. Some superimposed ground-glass changes are noted which may represent acute infiltrate particularly in the right lower lobe. Coronary calcifications are again noted. Hepatobiliary: No masses or other significant abnormality. Pancreas: No mass, inflammatory changes, or other significant abnormality. Spleen: Within normal limits in size and appearance. Adrenals/Urinary Tract: No masses identified. No evidence of hydronephrosis. Stomach/Bowel: The appendix is not well visualized. Diverticular changes noted without evidence of diverticulitis. Mild fecal material throughout the colon is noted consistent with mild constipation. No obstructive changes are seen. Vascular/Lymphatic: Aortoiliac calcifications are noted without aneurysmal dilatation. No significant lymphadenopathy is noted. Reproductive: No acute abnormality noted. Other: No free fluid is noted. Musculoskeletal: Degenerative changes of the lumbar spine are noted. Mild scoliosis is seen. IMPRESSION: Small right-sided pleural effusion. Diffuse fibrotic changes are noted in the bases with some mild  superimposed ground-glass infiltrate particularly in the right lower lobe. Chronic changes as described above. Electronically Signed   By: Inez Catalina M.D.   On: 03/21/2016 10:19   Dg Chest Port 1 View  Result Date: 03/21/2016 CLINICAL DATA:  Shortness of breath.  History of breast carcinoma EXAM: PORTABLE CHEST 1 VIEW COMPARISON:  February 16, 2016 chest radiograph and chest CT November 10, 2015 FINDINGS: There is widespread interstitial edema with patchy alveolar opacity throughout the left mid lung region as well as much the right lung. Heart is borderline enlarged with pulmonary venous hypertension. There is calcification of the mitral annulus. Patient is status post median sternotomy. Adenopathy noted on previous chest CT is less well appreciated by radiography. No bone lesions are evident. Patient is status post aortic valve replacement. Atherosclerotic calcification is noted in the aorta. IMPRESSION: Findings felt to be most consistent with a degree of congestive heart failure, apparently superimposed on chronic interstitial disease. A degree of superimposed pneumonia cannot be excluded radiographically. More than one of these entities may exist concurrently. Stable cardiac silhouette. Aortic atherosclerosis. Electronically Signed   By: Lowella Grip III M.D.   On: 03/21/2016 07:10    ROS:  As stated above in the HPI otherwise negative.  Blood pressure 107/55, pulse 118, temperature 97.6 F (36.4 C), temperature  source Oral, resp. rate 17, SpO2 90 %.    PE: Gen: Alert, Oriented to person and place HEENT:  [place]/AT, EOMI Neck: Supple, no LAD Lungs: CTA Bilaterally CV: RRR without M/G/R ABM: Soft, NTND, +BS Ext: No C/C/E  Assessment/Plan: 1) Anemia. 2) Constipation. 3) Dementia.   Obtain a history of limited with the patient.  I do not know if the information that she provided me was accurate.  I discussed the case with Dr. Candiss Norse and the rectal examination was negative for stool.  I am told  that the EGD was normal in [facility], but it will be reasonable to repeat the EGD.  She will undergo an EGD/Colonoscopy tomorrow, however, I am not certain that she will be able to properly prep for the colonoscopy.  Plan: 1) EGD/Colonoscopy tomorrow.  Kemon Devincenzi D 03/21/2016, 12:12 PM

## 2016-03-21 NOTE — ED Notes (Signed)
Pt from skilled facility and they received a report that her hemoglobin was low, pt complains of fatigue and body aches for a week

## 2016-03-21 NOTE — ED Notes (Signed)
Patient transported to CT 

## 2016-03-21 NOTE — ED Notes (Signed)
Patient c/o nausea, gave Zofran 4mg  that is ordered PRN

## 2016-03-21 NOTE — H&P (Addendum)
TRH H&P   Patient Demographics:    Vickie Murphy, is a 82 y.o. female  MRN: [id number]   DOB - [birth year]  Admit Date - 03/21/2016  Outpatient Primary MD for the patient is No PCP Per Patient  Patient coming from: SNF  Chief Complaint  Patient presents with  . Abnormal Lab      HPI:    Vickie Murphy  is a 82 y.o. female, With history of mild dementia, severe weakness walks with walker at baseline, GERD, DM type II now insulin-dependent, chronic congestive heart failure unknown type no echo in chart, history of breast cancer, depression, dyslipidemia who's been having issues with nausea for the last 3-4 months, underwent EGD at [facility] 2 weeks ago which according to family was completely unremarkable. She also has had multiple episodes of pneumonia requiring 3 courses of antibiotics in the last 1 month, she was sent here after at nursing home she was found to have a low hemoglobin.  Aim to the ER where the ER physician thought hemoglobin was not significantly dropped, he diagnosed her with pneumonia and question me to admit. Patient currently denies any cough, mild shortness of breath and orthopnea, no fever chills, denies any diarrhea, she does get intermittently constipated, does have nausea but no emesis, with abdominal discomfort says she has aches and pains all over her body and belly. Denies any dysuria, no focal weakness. Denies noticing any blood in stool or black stool. Denies any unintentional weight loss. Says she never had colonoscopy.    Review of systems:    In addition to the HPI above,   No Fever-chills, No Headache, No changes with Vision or hearing, No problems swallowing food or Liquids, No Chest  pain, Cough or Shortness of Breath, As above Abdominal pain, positive nausea without emesis, Bowel movements are regular, No Blood in stool or Urine, No dysuria, No new skin rashes or bruises, No new joints pains-aches,  No new weakness, tingling, numbness in any extremity, No recent weight gain or loss, No polyuria, polydypsia or polyphagia, No significant Mental Stressors.  A full 10 point Review of Systems was done, except as stated above, all other Review of Systems were negative.   With Past History of the following :    Past Medical History:  Diagnosis Date  . Acid reflux   .  Breast cancer ([place]) 2002   right  . Cancer [facility])    breast-s/p mastectomy and on chemo  . CHF (congestive heart failure) ([place])    pt reports hx of such  . Depression   . Diabetes mellitus without complication ([place])   . Hyperlipidemia   . Hypertension   . Recurrent UTI       Past Surgical History:  Procedure Laterality Date  . BREAST BIOPSY    . BREAST SURGERY Right   . CARDIAC VALVE REPLACEMENT     aortic, years ago-on no anticoagulation  . KIDNEY STONE SURGERY    . MASTECTOMY Right   . WISDOM TOOTH EXTRACTION        Social History:     Social History  Substance Use Topics  . Smoking status: Never Smoker  . Smokeless tobacco: Never Used  . Alcohol use No         Family History :     Family History  Problem Relation Age of Onset  . Cancer Father   . Drug abuse Brother   . Breast cancer Neg Hx        Home Medications:   Prior to Admission medications   Medication Sig Start Date End Date Taking? Authorizing Provider  acetaminophen (TYLENOL) 325 MG tablet Take 650 mg by mouth every 6 (six) hours as needed for mild pain or fever.   Yes Historical Provider, MD  albuterol (PROVENTIL) (2.5 MG/3ML) 0.083% nebulizer solution Take 2.5 mg by nebulization every 6 (six) hours as needed for wheezing or shortness of breath.   Yes Historical Provider, MD  ALPRAZolam Duanne Moron) 0.5 MG  tablet Take 0.5 mg by mouth 2 (two) times daily as needed for anxiety.   Yes Historical Provider, MD  ascorbic acid (VITAMIN C) 500 MG tablet Take 500 mg by mouth daily.   Yes Historical Provider, MD  aspirin EC 81 MG tablet Take 81 mg by mouth daily.   Yes Historical Provider, MD  atorvastatin (LIPITOR) 10 MG tablet Take 10 mg by mouth every evening. At night   Yes Historical Provider, MD  Cholecalciferol (VITAMIN D3) 2000 units TABS Take 2,000 Units by mouth daily. At 6 am   Yes Historical Provider, MD  dextromethorphan-guaiFENesin [facility] DM) 30-600 MG 12hr tablet Take 1 tablet by mouth 2 (two) times daily.   Yes Historical Provider, MD  dextrose (GLUTOSE) 40 % GEL Take 37.5 g (15 g of dextrose total) by mouth every 15 minutes as needed for hypoglycemia   Yes Historical Provider, MD  diltiazem (CARDIZEM CD) 120 MG 24 hr capsule Take 120 mg by mouth daily.   Yes Historical Provider, MD  docusate sodium (COLACE) 100 MG capsule Take 100 mg by mouth 2 (two) times daily as needed for mild constipation.   Yes Historical Provider, MD  FLUoxetine (PROZAC) 40 MG capsule Take 60 mg by mouth daily.    Yes Historical Provider, MD  folic acid (FOLVITE) A999333 MCG tablet Take 400 mcg by mouth daily.   Yes Historical Provider, MD  gabapentin (NEURONTIN) 300 MG capsule Take 300 mg by mouth 3 (three) times daily.   Yes Historical Provider, MD  guaifenesin (ROBITUSSIN) 100 MG/5ML syrup Take 200 mg by mouth every 4 (four) hours as needed for cough.   Yes Historical Provider, MD  HYDROcodone-acetaminophen (NORCO/VICODIN) 5-325 MG tablet Take one tablet by mouth three times daily for pain Patient taking differently: Take 1 tablet by mouth 2 (two) times daily.  03/17/16  Yes Gildardo Cranker,  DO  insulin glargine (LANTUS) 100 UNIT/ML injection Inject 10 Units into the skin at bedtime.   Yes Historical Provider, MD  insulin lispro (HUMALOG) 100 UNIT/ML injection Inject 2-15 Units into the skin 4 (four) times daily -  with  meals and at bedtime. PER SLIDING SCALE:  121-150= 2 units 151-200= 3 units 201-250= 5 units 251-300= 8 units 301-350= 11 units 351-400= 15 units 401-450= 18 units  > Greater than 450 CALL MD   Yes Historical Provider, MD  loperamide (IMODIUM) 2 MG capsule Take 2 mg by mouth 2 (two) times daily.   Yes Historical Provider, MD  magnesium oxide (MAG-OX) 400 MG tablet Take 400 mg by mouth daily.    Yes Historical Provider, MD  meclizine (ANTIVERT) 12.5 MG tablet Take 12.5 mg by mouth 3 (three) times daily as needed for dizziness.    Yes Historical Provider, MD  metFORMIN (GLUCOPHAGE) 500 MG tablet Take 500 mg by mouth 2 (two) times daily with a meal.   Yes Historical Provider, MD  mirtazapine (REMERON) 15 MG tablet Take 15 mg by mouth at bedtime.   Yes Historical Provider, MD  moxifloxacin (AVELOX) 400 MG tablet Take 400 mg by mouth daily at 8 pm.   Yes Historical Provider, MD  Multiple Vitamin (TAB-A-VITE PO) Take 1 tablet by mouth daily.   Yes Historical Provider, MD  omeprazole (PRILOSEC) 40 MG capsule Take 40 mg by mouth daily.   Yes Historical Provider, MD  promethazine (PHENERGAN) 25 MG tablet Take 25 mg by mouth every 6 (six) hours as needed for nausea.   Yes Historical Provider, MD  traZODone (DESYREL) 100 MG tablet Take 100 mg by mouth at bedtime.   Yes Historical Provider, MD     Allergies:    No Known Allergies   Physical Exam:   Vitals  Blood pressure 112/56, pulse 118, temperature 97.6 F (36.4 C), temperature source Oral, resp. rate 22, SpO2 94 %.   1. General frail elderly white female lying in bed in mild nausea,  2. Normal affect and insight, Not Suicidal or Homicidal, Awake Alert, Oriented X 3.  3. No F.N deficits, ALL C.Nerves Intact, Strength 5/5 all 4 extremities, Sensation intact all 4 extremities, Plantars down going.  4. Ears and Eyes appear Normal, Conjunctivae clear, PERRLA. Moist Oral Mucosa.  5. Supple Neck, No JVD, No cervical lymphadenopathy  appriciated, No Carotid Bruits.  6. Symmetrical Chest wall movement, Good air movement bilaterally, bilateral crackles  7. RRR, No Gallops, Rubs or Murmurs, No Parasternal Heave.  8. Positive Bowel Sounds, Abdomen Soft, No tenderness, No organomegaly appriciated,No rebound -guarding or rigidity.  9.  No Cyanosis, Normal Skin Turgor, No Skin Rash or Bruise.  10. Good muscle tone,  joints appear normal , no effusions, Normal ROM.  11. No Palpable Lymph Nodes in Neck or Axillae      Data Review:    CBC  Recent Labs Lab 03/21/16 0528 03/21/16 0600  WBC 13.6*  --   HGB 8.0* 5.8*  HCT 26.2* 17.0*  PLT 247  --   MCV 94.6  --   MCH 28.9  --   MCHC 30.5  --   RDW 19.9*  --   LYMPHSABS 1.5  --   MONOABS 0.4  --   EOSABS 0.2  --   BASOSABS 0.0  --    ------------------------------------------------------------------------------------------------------------------  Chemistries   Recent Labs Lab 03/21/16 0528 03/21/16 0600  NA 137 137  K 4.4 4.3  CL 102 100*  CO2 23  --   GLUCOSE 235* 236*  BUN 48* 44*  CREATININE 1.04* 1.10*  CALCIUM 8.6*  --    ------------------------------------------------------------------------------------------------------------------ estimated creatinine clearance is 31.7 mL/min (by C-G formula based on SCr of 1.1 mg/dL). ------------------------------------------------------------------------------------------------------------------ No results for input(s): TSH, T4TOTAL, T3FREE, THYROIDAB in the last 72 hours.  Invalid input(s): FREET3  Coagulation profile  Recent Labs Lab 03/21/16 0528  INR 1.32   ------------------------------------------------------------------------------------------------------------------- No results for input(s): DDIMER in the last 72 hours. -------------------------------------------------------------------------------------------------------------------  Cardiac Enzymes No results for input(s): CKMB,  TROPONINI, MYOGLOBIN in the last 168 hours.  Invalid input(s): CK ------------------------------------------------------------------------------------------------------------------    Component Value Date/Time   BNP 585.5 (H) 03/21/2016 0528     ---------------------------------------------------------------------------------------------------------------  Urinalysis No results found for: COLORURINE, APPEARANCEUR, LABSPEC, PHURINE, GLUCOSEU, HGBUR, BILIRUBINUR, KETONESUR, PROTEINUR, UROBILINOGEN, NITRITE, LEUKOCYTESUR  ----------------------------------------------------------------------------------------------------------------   Imaging Results:    Dg Chest Port 1 View  Result Date: 03/21/2016 CLINICAL DATA:  Shortness of breath.  History of breast carcinoma EXAM: PORTABLE CHEST 1 VIEW COMPARISON:  February 16, 2016 chest radiograph and chest CT November 10, 2015 FINDINGS: There is widespread interstitial edema with patchy alveolar opacity throughout the left mid lung region as well as much the right lung. Heart is borderline enlarged with pulmonary venous hypertension. There is calcification of the mitral annulus. Patient is status post median sternotomy. Adenopathy noted on previous chest CT is less well appreciated by radiography. No bone lesions are evident. Patient is status post aortic valve replacement. Atherosclerotic calcification is noted in the aorta. IMPRESSION: Findings felt to be most consistent with a degree of congestive heart failure, apparently superimposed on chronic interstitial disease. A degree of superimposed pneumonia cannot be excluded radiographically. More than one of these entities may exist concurrently. Stable cardiac silhouette. Aortic atherosclerosis. Electronically Signed   By: Lowella Grip III M.D.   On: 03/21/2016 07:10    My personal review of EKG: Rhythm NSR,     Assessment & Plan:     1. Abdominal pain with chronic nausea. Abdominal exam is benign,  however symptoms ongoing for several weeks, unremarkable EGD per family at [facility] 2 weeks ago, Hemoccult negative. We will admit her, soft diet, get CT scan abdomen and pelvis. Supportive care until then.  2. Anemia. Check anemia panel, since her BUN is up ? UGI bleed, monitor H&H, IV PPI for now, recent EGD was unremarkable per family which is reassuring, type screen and monitor. Hemoccult was negative in the ER.    Addendum at 10.25 am- repeat HB again is 5, likely UGI bleed, IV PPI, NPO, GI input. 3 units ordered for transfusion.    3. Bilateral infiltrates with clinical uncertainty. Likely CHF however pneumonia cannot be ruled out, he recently had 3 courses of antibiotics in the last month, for now blood cultures sputum cultures, IV vancomycin and Zosyn. Speech evaluation and aspiration precaution.  4. More likely that #3 above is CHF. No echocardiogram in the chart. Will check echocardiogram, initiate IV Lasix, Nitropaste and monitor.  5. Mild dementia. At risk for delirium. Continue supportive care.  6. DM type II. On Lantus and sliding scale check A1c. Monitor CBGs.  7. Dyslipidemia. Continue home dose statin.  8. Anxiety depression. Home medications continued.    DVT Prophylaxis Heparin   AM Labs Ordered, also please review Full Orders  Family Communication: Admission, patients condition and plan of care including tests being ordered have been discussed with the patient and family-POA who indicate understanding and agree  with the plan and Code Status.  Code Status DNR  Likely DC to  SNF 3-4 days  Condition GUARDED    Consults called: None    Admission status: Inpt    Time spent in minutes : 35   SINGH,PRASHANT K M.D on 03/21/2016 at 8:22 AM  Between 7am to 7pm - Pager - [phone]. After 7pm go to [email or web address] - password [facility]  [facility] - Office  [phone]

## 2016-03-21 NOTE — Progress Notes (Signed)
  Echocardiogram 2D Echocardiogram has been performed.  Billye Pickerel 03/21/2016, 9:26 AM

## 2016-03-21 NOTE — Evaluation (Signed)
Clinical/Bedside Swallow Evaluation Patient Details  Name: Vickie Murphy MRN: [id number] Date of Birth: 09-02-1932  Today's Date: 03/21/2016 Time: SLP Start Time (ACUTE ONLY): 53 SLP Stop Time (ACUTE ONLY): 1328 SLP Time Calculation (min) (ACUTE ONLY): 22 min  Past Medical History:  Past Medical History:  Diagnosis Date  . Acid reflux   . Breast cancer ([place]) 2002   right  . Cancer [facility])    breast-s/p mastectomy and on chemo  . CHF (congestive heart failure) ([place])    pt reports hx of such  . Depression   . Diabetes mellitus without complication ([place])   . Hyperlipidemia   . Hypertension   . Recurrent UTI    Past Surgical History:  Past Surgical History:  Procedure Laterality Date  . BREAST BIOPSY    . BREAST SURGERY Right   . CARDIAC VALVE REPLACEMENT     aortic, years ago-on no anticoagulation  . KIDNEY STONE SURGERY    . MASTECTOMY Right   . WISDOM TOOTH EXTRACTION     HPI:  82 yo female adm to [facility] from SNF with concerns for pneumonia and low hemoglobin.  PMH + for mild dementia, GERD, severe weakness walks with walker at baseline, DM type II now insulin-dependent, chronic congestive heart failure unknown type no echo in chart, history of breast cancer, depression, dyslipidemia who's been having issues with nausea for the last 3-4 months, underwent EGD at [facility] 2 weeks ago which according to family was completely unremarkable. She also has had multiple episodes of pneumonia requiring 3 courses of antibiotics in the last 1 month.  Swallow evaluation ordered.     Assessment / Plan / Recommendation Clinical Impression  Pt presents with overall functional oropharyngeal swallow ability based on limited po observed.  Pt willingly accepted juice *4 ounces, Boost Clear and po medications given with liquids by RN.  Swallow was timely with clear voice t/o intake and no indication of airway compromise.  Pt denies h/o dysphagia but given recurrent pnas and GERD - also noted ? left  lower facial asymmetry - will follow up to assure tolerance.  Recommend to start clears due to pt's nausea and concern for potential vomiting/asp risk.      Aspiration Risk  Mild aspiration risk    Diet Recommendation  (clears)   Liquid Administration via: Cup;Straw Medication Administration: Whole meds with liquid Supervision: Patient able to self feed Compensations: Slow rate;Small sips/bites Postural Changes: Seated upright at 90 degrees;Remain upright for at least 30 minutes after po intake    Other  Recommendations Oral Care Recommendations: Oral care BID   Follow up Recommendations       Frequency and Duration min 1 x/week  1 week       Prognosis Prognosis for Safe Diet Advancement: Good      Swallow Study   General Date of Onset: 03/21/16 HPI: 82 yo female adm to [facility] from SNF with concerns for pneumonia and low hemoglobin.  PMH + for mild dementia, GERD, severe weakness walks with walker at baseline, DM type II now insulin-dependent, chronic congestive heart failure unknown type no echo in chart, history of breast cancer, depression, dyslipidemia who's been having issues with nausea for the last 3-4 months, underwent EGD at [facility] 2 weeks ago which according to family was completely unremarkable. She also has had multiple episodes of pneumonia requiring 3 courses of antibiotics in the last 1 month.  Swallow evaluation ordered.   Type of Study: Bedside Swallow Evaluation Diet Prior  to this Study: NPO Temperature Spikes Noted: No Respiratory Status: Nasal cannula Behavior/Cognition: Alert;Impulsive;Distractible (by pain, pt with significant moaning during eval) Oral Cavity Assessment: Within Functional Limits Oral Care Completed by SLP: No Oral Cavity - Dentition: Adequate natural dentition Vision: Functional for self-feeding Self-Feeding Abilities: Able to feed self Patient Positioning: Upright in bed Baseline Vocal Quality: Normal Volitional Cough: Weak Volitional  Swallow: Unable to elicit    Oral/Motor/Sensory Function Overall Oral Motor/Sensory Function: Mild impairment Facial ROM: Reduced left Facial Symmetry: Abnormal symmetry left Facial Strength: Reduced left Facial Sensation: Other (Comment) (dnt) Lingual ROM: Other (Comment) (reduced) Velum: Other (comment) (difficult to view due to pt's tongue elevation posterior, despite 2 attempts with spoon for lingual pressure)   Ice Chips Ice chips: Not tested   Thin Liquid Thin Liquid: Within functional limits Presentation: Self Fed;Straw    Nectar Thick Nectar Thick Liquid: Not tested   Honey Thick Honey Thick Liquid: Not tested   Puree Puree: Not tested Other Comments: due to pt with significant abdomen discomfort   Solid   GO   Solid: Not tested Other Comments: due to pt with significant abdomen discomfor        Claudie Fisherman, [facility] SLP [phone]

## 2016-03-21 NOTE — ED Notes (Signed)
Made floor aware that Echo wants to do scan down here so patient not going to floor at this time.

## 2016-03-21 NOTE — Progress Notes (Signed)
Pharmacy Antibiotic Note  Vickie Murphy is a 82 y.o. female admitted from SNF on 03/21/2016 with possible recurrent pneumonia.  Patient has had multiple episodes of pneumonia requiring 3 courses of antibiotics in the last 1 month.  Most recently started on Avelox.  Pt presented with abdominal pain and nausea.  CT scan pending.  Pharmacy has been consulted for vancomycin and zosyn dosing.  8/15 CXR impression = Findings felt to be most consistent with a degree of congestive heart failure, apparently superimposed on chronic interstitial disease. A degree of superimposed pneumonia cannot be excluded radiographically. More than one of these entities may exist concurrently.    Plan: Vancomycin 1g x 1 then 750 mg IV q24h. Zosyn 3.375g IV q8h (4 hour infusion time).      Temp (24hrs), Avg:97.6 F (36.4 C), Min:97.6 F (36.4 C), Max:97.6 F (36.4 C)   Recent Labs Lab 03/21/16 0528 03/21/16 0600  WBC 13.6*  --   CREATININE 1.04* 1.10*    Estimated Creatinine Clearance: 31.7 mL/min (by C-G formula based on SCr of 1.1 mg/dL).    No Known Allergies  Antimicrobials this admission: 8/15 Vancomycin >>  8/15 Zosyn >>   Dose adjustments this admission: -  Microbiology results: 8/15 BCx: sent 8/15 Sputum: ordered   Thank you for allowing pharmacy to be a part of this patient's care.  Hershal Coria 03/21/2016 9:14 AM

## 2016-03-21 NOTE — ED Notes (Signed)
RN at bedside placing IV and collecting labs.  

## 2016-03-22 ENCOUNTER — Encounter (HOSPITAL_COMMUNITY)
Admission: EM | Disposition: A | Discharge status: Hospice | Payer: Self-pay | Source: Home / Self Care | Attending: Internal Medicine

## 2016-03-22 ENCOUNTER — Inpatient Hospital Stay (HOSPITAL_COMMUNITY): Payer: Medicare Other

## 2016-03-22 ENCOUNTER — Inpatient Hospital Stay (HOSPITAL_COMMUNITY): Payer: Medicare Other | Admitting: Anesthesiology

## 2016-03-22 ENCOUNTER — Encounter (HOSPITAL_COMMUNITY): Payer: Self-pay

## 2016-03-22 HISTORY — PX: ESOPHAGOGASTRODUODENOSCOPY: SHX5428

## 2016-03-22 LAB — TYPE AND SCREEN
ABO/RH(D): O POS
ANTIBODY SCREEN: NEGATIVE
UNIT DIVISION: 0
Unit division: 0
Unit division: 0

## 2016-03-22 LAB — BASIC METABOLIC PANEL
Anion gap: 9 (ref 5–15)
BUN: 62 mg/dL — ABNORMAL HIGH (ref 6–20)
CHLORIDE: 105 mmol/L (ref 101–111)
CO2: 22 mmol/L (ref 22–32)
CREATININE: 1.3 mg/dL — AB (ref 0.44–1.00)
Calcium: 7.4 mg/dL — ABNORMAL LOW (ref 8.9–10.3)
GFR, EST AFRICAN AMERICAN: 43 mL/min — AB (ref >60)
GFR, EST NON AFRICAN AMERICAN: 37 mL/min — AB (ref >60)
Glucose, Bld: 348 mg/dL — ABNORMAL HIGH (ref 65–99)
POTASSIUM: 4.3 mmol/L (ref 3.5–5.1)
SODIUM: 136 mmol/L (ref 135–145)

## 2016-03-22 LAB — CBC
HCT: 28 % — ABNORMAL LOW (ref 36.0–46.0)
HEMOGLOBIN: 9.2 g/dL — AB (ref 12.0–15.0)
MCH: 29.4 pg (ref 26.0–34.0)
MCHC: 32.9 g/dL (ref 30.0–36.0)
MCV: 89.5 fL (ref 78.0–100.0)
PLATELETS: 129 10*3/uL — AB (ref 150–400)
RBC: 3.13 MIL/uL — AB (ref 3.87–5.11)
RDW: 16.4 % — ABNORMAL HIGH (ref 11.5–15.5)
WBC: 12.9 10*3/uL — ABNORMAL HIGH (ref 4.0–10.5)

## 2016-03-22 LAB — PROTIME-INR
INR: 2.19
PROTHROMBIN TIME: 24.7 s — AB (ref 11.4–15.2)

## 2016-03-22 LAB — GLUCOSE, CAPILLARY
GLUCOSE-CAPILLARY: 338 mg/dL — AB (ref 65–99)
GLUCOSE-CAPILLARY: 219 mg/dL — AB (ref 65–99)
GLUCOSE-CAPILLARY: 189 mg/dL — AB (ref 65–99)

## 2016-03-22 LAB — HEMOGLOBIN A1C
Hgb A1c MFr Bld: 7.2 % — ABNORMAL HIGH (ref 4.8–5.6)
MEAN PLASMA GLUCOSE: 160 mg/dL

## 2016-03-22 LAB — MRSA PCR SCREENING: MRSA by PCR: NEGATIVE

## 2016-03-22 LAB — HAPTOGLOBIN: HAPTOGLOBIN: 137 mg/dL (ref 34–200)

## 2016-03-22 SURGERY — EGD (ESOPHAGOGASTRODUODENOSCOPY)
Anesthesia: Monitor Anesthesia Care

## 2016-03-22 MED ORDER — MORPHINE SULFATE (PF) 2 MG/ML IV SOLN
1.0000 mg | INTRAVENOUS | Status: AC
  Administered 2016-03-22: 1 mg via INTRAVENOUS
  Filled 2016-03-22: qty 1

## 2016-03-22 MED ORDER — PROPOFOL 10 MG/ML IV BOLUS
INTRAVENOUS | Status: DC | PRN
  Administered 2016-03-22: 30 mg via INTRAVENOUS

## 2016-03-22 MED ORDER — PROPOFOL 10 MG/ML IV BOLUS
INTRAVENOUS | Status: AC
  Filled 2016-03-22: qty 40

## 2016-03-22 MED ORDER — LIDOCAINE HCL (CARDIAC) 20 MG/ML IV SOLN
INTRAVENOUS | Status: DC | PRN
  Administered 2016-03-22: 50 mg via INTRATRACHEAL

## 2016-03-22 MED ORDER — LACTATED RINGERS IV SOLN
INTRAVENOUS | Status: DC | PRN
  Administered 2016-03-22: 12:00:00 via INTRAVENOUS

## 2016-03-22 MED ORDER — PROPOFOL 500 MG/50ML IV EMUL
INTRAVENOUS | Status: DC | PRN
  Administered 2016-03-22: 125 ug/kg/min via INTRAVENOUS

## 2016-03-22 MED ORDER — LACTATED RINGERS IV SOLN
INTRAVENOUS | Status: DC
  Administered 2016-03-22: 1000 mL via INTRAVENOUS

## 2016-03-22 NOTE — NC FL2 (Signed)
[place] LEVEL OF CARE SCREENING TOOL     IDENTIFICATION  Patient Name: Vickie Murphy Birthdate: 05-21-1933 Sex: female Admission Date (Current Location): 03/21/2016  [facility] [place] Number:  Herbalist and Address:  [facility],  [place] [address], [place]      Provider Number: [id number]  Attending Physician Name and Address:  Theodis Blaze, MD  Relative Name and Phone Number:       Current Level of Care: Hospital Recommended Level of Care: [place] Prior Approval Number:    Date Approved/Denied:   PASRR Number: [id number]  Discharge Plan: SNF    Current Diagnoses: Patient Active Problem List   Diagnosis Date Noted  . Acute hypoxemic respiratory failure ([place]) 03/21/2016  . CHF (congestive heart failure) ([place]) 03/21/2016  . Klebsiella cystitis 02/26/2016  . GERD (gastroesophageal reflux disease) 02/26/2016  . Neuropathy due to type 2 diabetes mellitus ([place]) 02/26/2016  . Sepsis ([place]) 12/26/2015  . CAP (community acquired pneumonia) 12/26/2015  . SOB (shortness of breath) 06/07/2015  . Ulcer of right groin ([place]) 06/02/2015  . Falls frequently 01/05/2015  . Dizziness 01/05/2015  . [place] intertrigo 01/05/2015  . Diabetes mellitus without complication ([place])   . Depression   . Hypertension   . Recurrent UTI   . Cancer of breast ([place])   . Hyperlipidemia     Orientation RESPIRATION BLADDER Height & Weight     Self  O2 (at 3L) Incontinent Weight: 140 lb 4.8 oz (63.6 kg) Height:     BEHAVIORAL SYMPTOMS/MOOD NEUROLOGICAL BOWEL NUTRITION STATUS   (NONE )  (NONE ) Incontinent Diet (Currently NPO)  AMBULATORY STATUS COMMUNICATION OF NEEDS Skin   Extensive Assist Verbally Normal                       Personal Care Assistance Level of Assistance  Bathing, Feeding, Dressing Bathing Assistance: Maximum assistance Feeding assistance: Limited assistance Dressing Assistance: Maximum assistance      Functional Limitations Info  Speech, Hearing, Sight Sight Info: Adequate Hearing Info: Adequate Speech Info: Adequate    SPECIAL CARE FACTORS FREQUENCY                       Contractures      Additional Factors Info  Code Status, Allergies Code Status Info: DNR CODE  Allergies Info: N/A           Current Medications (03/22/2016):  This is the current hospital active medication list Current Facility-Administered Medications  Medication Dose Route Frequency Provider Last Rate Last Dose  . 0.9 %  sodium chloride infusion  10 mL/hr Intravenous Once Everlene Balls, MD      . 0.9 %  sodium chloride infusion   Intravenous Continuous Carol Ada, MD 20 mL/hr at 03/21/16 2338    . acetaminophen (TYLENOL) tablet 650 mg  650 mg Oral Q6H PRN Thurnell Lose, MD      . albuterol (PROVENTIL) (2.5 MG/3ML) 0.083% nebulizer solution 2.5 mg  2.5 mg Nebulization Q6H PRN Thurnell Lose, MD      . ALPRAZolam Duanne Moron) tablet 0.5 mg  0.5 mg Oral BID PRN Thurnell Lose, MD      . aspirin EC tablet 81 mg  81 mg Oral Daily Thurnell Lose, MD   81 mg at 03/21/16 1310  . atorvastatin (LIPITOR) tablet 10 mg  10 mg Oral QPM Thurnell Lose, MD      .  diltiazem (CARDIZEM CD) 24 hr capsule 120 mg  120 mg Oral Daily Thurnell Lose, MD   120 mg at 03/21/16 1323  . diphenhydrAMINE (BENADRYL) injection 25 mg  25 mg Intravenous Q6H PRN Thurnell Lose, MD      . docusate sodium (COLACE) capsule 100 mg  100 mg Oral BID PRN Thurnell Lose, MD      . FLUoxetine (PROZAC) capsule 60 mg  60 mg Oral Daily Thurnell Lose, MD   60 mg at 03/21/16 1323  . furosemide (LASIX) injection 20 mg  20 mg Intravenous BID Thurnell Lose, MD   20 mg at 03/22/16 1018  . heparin injection 5,000 Units  5,000 Units Subcutaneous Q8H Thurnell Lose, MD   5,000 Units at 03/22/16 0507  . hydrALAZINE (APRESOLINE) injection 10 mg  10 mg Intravenous Q6H PRN Thurnell Lose, MD      . HYDROcodone-acetaminophen  (NORCO/VICODIN) 5-325 MG per tablet 1-2 tablet  1-2 tablet Oral Q4H PRN Thurnell Lose, MD   1 tablet at 03/21/16 1818  . insulin aspart (novoLOG) injection 0-9 Units  0-9 Units Subcutaneous TID WC Thurnell Lose, MD   7 Units at 03/22/16 0818  . insulin glargine (LANTUS) injection 10 Units  10 Units Subcutaneous QHS Thurnell Lose, MD   10 Units at 03/21/16 2233  . metoprolol tartrate (LOPRESSOR) tablet 25 mg  25 mg Oral BID Thurnell Lose, MD      . mirtazapine (REMERON) tablet 15 mg  15 mg Oral QHS Thurnell Lose, MD   15 mg at 03/21/16 2233  . ondansetron (ZOFRAN) injection 4 mg  4 mg Intravenous Q6H PRN Thurnell Lose, MD   4 mg at 03/21/16 0813  . pantoprazole (PROTONIX) 80 mg in sodium chloride 0.9 % 250 mL (0.32 mg/mL) infusion  8 mg/hr Intravenous Continuous Thurnell Lose, MD 25 mL/hr at 03/22/16 0613 8 mg/hr at 03/22/16 0613  . [START ON 03/24/2016] pantoprazole (PROTONIX) injection 40 mg  40 mg Intravenous Q12H Thurnell Lose, MD      . piperacillin-tazobactam (ZOSYN) IVPB 3.375 g  3.375 g Intravenous Q8H Donald Prose Runyon, RPH   3.375 g at 03/22/16 1018  . polyethylene glycol (MIRALAX / GLYCOLAX) packet 17 g  17 g Oral BID Thurnell Lose, MD   17 g at 03/21/16 2234  . traZODone (DESYREL) tablet 100 mg  100 mg Oral QHS Thurnell Lose, MD   100 mg at 03/21/16 2233  . vancomycin (VANCOCIN) IVPB 750 mg/150 ml premix  750 mg Intravenous Q24H Dara Hoyer, [facility]         Discharge Medications: Please see discharge summary for a list of discharge medications.  Relevant Imaging Results:  Relevant Lab Results:   Additional Information SSN [id number]  Glendon Axe A

## 2016-03-22 NOTE — Transfer of Care (Signed)
Immediate Anesthesia Transfer of Care Note  Patient: Magan Degenhart  Procedure(s) Performed: Procedure(s): ESOPHAGOGASTRODUODENOSCOPY (EGD) (N/A)  Patient Location: PACU and Endoscopy Unit  Anesthesia Type:MAC  Level of Consciousness: sedated and patient cooperative  Airway & Oxygen Therapy: Patient Spontanous Breathing and Patient connected to nasal cannula oxygen  Post-op Assessment: Report given to RN and Post -op Vital signs reviewed and stable  Post vital signs: Reviewed and stable  Last Vitals:  Vitals:   03/22/16 0618 03/22/16 1147  BP: 110/76 (!) 127/53  Pulse: 94 100  Resp: 20 16  Temp: 36.9 C 36.4 C    Last Pain:  Vitals:   03/22/16 1147  TempSrc: Oral  PainSc:       Patients Stated Pain Goal: 0 ([id number])  Complications: No apparent anesthesia complications

## 2016-03-22 NOTE — Anesthesia Preprocedure Evaluation (Addendum)
Anesthesia Evaluation  Patient identified by MRN, date of birth, ID band Patient awake    Reviewed: Allergy & Precautions, NPO status , Patient's Chart, lab work & pertinent test results  Airway Mallampati: II  TM Distance: >3 FB Neck ROM: Full    Dental   Pulmonary shortness of breath, pneumonia,    + rhonchi        Cardiovascular hypertension, Pt. on medications and Pt. on home beta blockers +CHF  + Valvular Problems/Murmurs (Moderate AS. Mod MS, Moderate TR. Nl EF) AS  Rhythm:Regular Rate:Normal + Systolic murmurs    Neuro/Psych Depression negative neurological ROS     GI/Hepatic Neg liver ROS, GERD  Medicated,  Endo/Other  diabetes, Type 2, Insulin Dependent  Renal/GU negative Renal ROS     Musculoskeletal   Abdominal   Peds  Hematology  (+) anemia ,   Anesthesia Other Findings   Reproductive/Obstetrics                           Lab Results  Component Value Date   WBC 12.9 (H) 03/22/2016   HGB 9.2 (L) 03/22/2016   HCT 28.0 (L) 03/22/2016   MCV 89.5 03/22/2016   PLT 129 (L) 03/22/2016   Lab Results  Component Value Date   CREATININE 1.30 (H) 03/22/2016   BUN 62 (H) 03/22/2016   NA 136 03/22/2016   K 4.3 03/22/2016   CL 105 03/22/2016   CO2 22 03/22/2016    Anesthesia Physical Anesthesia Plan  ASA: IV  Anesthesia Plan: MAC   Post-op Pain Management:    Induction: Intravenous  Airway Management Planned: Natural Airway and Nasal Cannula  Additional Equipment:   Intra-op Plan:   Post-operative Plan:   Informed Consent: I have reviewed the patients History and Physical, chart, labs and discussed the procedure including the risks, benefits and alternatives for the proposed anesthesia with the patient or authorized representative who has indicated his/her understanding and acceptance.     Plan Discussed with: CRNA  Anesthesia Plan Comments:        Anesthesia  Quick Evaluation

## 2016-03-22 NOTE — Progress Notes (Signed)
Patient ID: Vickie Murphy, female   DOB: May 07, 1933, 82 y.o.   MRN: [id number]    PROGRESS NOTE    Vickie Murphy  [id number] DOB: 1933-06-07 DOA: 03/21/2016  PCP: Glendon Axe, MD   Brief Narrative:   82 y.o. female, With history of mild dementia, severe weakness walks with walker at baseline, GERD, DM type II now insulin-dependent, chronic congestive heart failure unknown type no echo in chart, history of breast cancer, depression, dyslipidemia who's been having issues with nausea for the last 3-4 months, underwent EGD at [facility] 2 weeks ago which according to family was completely unremarkable.  Assessment & Plan:   Principal Problem:   Acute hypoxemic respiratory failure (HCC) - secondary to ? RLL PNA and ? Acute diastolic CHF - currently on vanc and zosyn for PNA but since not entirely clear, it would be good idea to proceed with CT chest for clearer evaluation  - also would ask for SLP to rule out aspiration  - continue lasix for now and monitor clinical response  - follow up on ECHO    Acute blood loss anemia with underlying constipation, abd pain in the lower quadrants  - s/p three U PRBC transfusion and with Hg up from 5 --> 9 - no evidence of active bleeding - EGD and colonoscopy today - appreciate GI team following  - stop Heparin SQ    Thrombocytopenia - ? Reactive - CBC in AM  Active Problems:   Pre renal azotemia - from acute blood loss anemia - continue to monitor  - BMP in AM    Diabetes mellitus with long term use Insulin  - reasonable inpatient control  - continue Lantus and SSI    Hypertension, essential - continue cardizem , lasix, metoprolol     Hyperlipidemia - continue statin    DVT prophylaxis: SCD's, discontinue Heparin SQ Code Status: DNR Family Communication: Patient and POA former husband at bedside  Disposition Plan: SNF when medically ready   Consultants:   GI  Procedures:   EGD and colonoscopy 8/15 -->  Antimicrobials:     Vancomycin and Zosyn 8/15 -->   Subjective: Still with abd pain mostly in the lower abd quadrants.   Objective: Vitals:   03/22/16 1236 03/22/16 1240 03/22/16 1250 03/22/16 1300  BP: 117/64 124/61 (!) 142/69   Pulse: 93 91 93 93  Resp: 15 15 17 16   Temp:      TempSrc:      SpO2: 97% 98% 97% 97%  Weight:        Intake/Output Summary (Last 24 hours) at 03/22/16 1330 Last data filed at 03/22/16 1228  Gross per 24 hour  Intake          1692.17 ml  Output                0 ml  Net          1692.17 ml   Filed Weights   03/22/16 0618  Weight: 63.6 kg (140 lb 4.8 oz)    Examination:  General exam: Appears uncomfortable due to pain.  Respiratory system: Clear to auscultation. Respiratory effort normal. Cardiovascular system: S1 & S2 heard, RRR. No JVD, murmurs, rubs, gallops or clicks. No pedal edema. Gastrointestinal system: Abdomen is nondistended, soft and tender in lower abd quadrants.  Central nervous system: Alert and oriented. No focal neurological deficits.  Data Reviewed: I have personally reviewed following labs and imaging studies  CBC:  Recent Labs Lab 03/21/16 0528 03/21/16 0600  03/21/16 0836 03/21/16 1120 03/21/16 2323 03/22/16 0535  WBC 13.6*  --   --   --   --  12.9*  NEUTROABS 11.6*  --   --   --   --   --   HGB 8.0* 5.8* 5.1* 5.1* 9.7* 9.2*  HCT 26.2* 17.0* 17.0* 16.5* 30.0* 28.0*  MCV 94.6  --   --   --   --  89.5  PLT 247  --   --   --   --  [id number]*   Basic Metabolic Panel:  Recent Labs Lab 03/21/16 0528 03/21/16 0600 03/22/16 0535  NA 137 137 136  K 4.4 4.3 4.3  CL 102 100* 105  CO2 23  --  22  GLUCOSE 235* 236* 348*  BUN 48* 44* 62*  CREATININE 1.04* 1.10* 1.30*  CALCIUM 8.6*  --  7.4*   Coagulation Profile:  Recent Labs Lab 03/21/16 0528 03/21/16 1120 03/22/16 0535  INR 1.32 1.95 2.19   HbA1C:  Recent Labs  03/21/16 0836  HGBA1C 7.2*   CBG:  Recent Labs Lab 03/21/16 0833 03/21/16 1209 03/21/16 1632  03/21/16 2149 03/22/16 0743  GLUCAP 316* 251* 302* 389* 338*   Thyroid Function Tests:  Recent Labs  03/21/16 1120  TSH 1.965   Anemia Panel:  Recent Labs  03/21/16 0808  VITAMINB12 1,176*  FOLATE 46.7  FERRITIN 279  TIBC 277  IRON 15*  RETICCTPCT 11.5*    Recent Results (from the past 240 hour(s))  MRSA PCR Screening     Status: None   Collection Time: 03/21/16 11:50 PM  Result Value Ref Range Status   MRSA by PCR NEGATIVE NEGATIVE Final    Comment:        The GeneXpert MRSA Assay (FDA approved for NASAL specimens only), is one component of a comprehensive MRSA colonization surveillance program. It is not intended to diagnose MRSA infection nor to guide or monitor treatment for MRSA infections.       Radiology Studies: Ct Abdomen Pelvis W Contrast  Result Date: 03/21/2016 CLINICAL DATA:  Low hemoglobin and weakness with nausea and history of breast carcinoma EXAM: CT ABDOMEN AND PELVIS WITH CONTRAST TECHNIQUE: Multidetector CT imaging of the abdomen and pelvis was performed using the standard protocol following bolus administration of intravenous contrast. CONTRAST:  160mL ISOVUE-300 IOPAMIDOL (ISOVUE-300) INJECTION 61% COMPARISON:  None. FINDINGS: Lower chest: Small right-sided pleural effusion is noted. Diffuse fibrotic changes are noted in the bases bilaterally. Some superimposed ground-glass changes are noted which may represent acute infiltrate particularly in the right lower lobe. Coronary calcifications are again noted. Hepatobiliary: No masses or other significant abnormality. Pancreas: No mass, inflammatory changes, or other significant abnormality. Spleen: Within normal limits in size and appearance. Adrenals/Urinary Tract: No masses identified. No evidence of hydronephrosis. Stomach/Bowel: The appendix is not well visualized. Diverticular changes noted without evidence of diverticulitis. Mild fecal material throughout the colon is noted consistent with mild  constipation. No obstructive changes are seen. Vascular/Lymphatic: Aortoiliac calcifications are noted without aneurysmal dilatation. No significant lymphadenopathy is noted. Reproductive: No acute abnormality noted. Other: No free fluid is noted. Musculoskeletal: Degenerative changes of the lumbar spine are noted. Mild scoliosis is seen. IMPRESSION: Small right-sided pleural effusion. Diffuse fibrotic changes are noted in the bases with some mild superimposed ground-glass infiltrate particularly in the right lower lobe. Chronic changes as described above. Electronically Signed   By: Inez Catalina M.D.   On: 03/21/2016 10:19   Dg Chest [facility]  Result Date: 03/21/2016 CLINICAL DATA:  Shortness of breath.  History of breast carcinoma EXAM: PORTABLE CHEST 1 VIEW COMPARISON:  February 16, 2016 chest radiograph and chest CT November 10, 2015 FINDINGS: There is widespread interstitial edema with patchy alveolar opacity throughout the left mid lung region as well as much the right lung. Heart is borderline enlarged with pulmonary venous hypertension. There is calcification of the mitral annulus. Patient is status post median sternotomy. Adenopathy noted on previous chest CT is less well appreciated by radiography. No bone lesions are evident. Patient is status post aortic valve replacement. Atherosclerotic calcification is noted in the aorta. IMPRESSION: Findings felt to be most consistent with a degree of congestive heart failure, apparently superimposed on chronic interstitial disease. A degree of superimposed pneumonia cannot be excluded radiographically. More than one of these entities may exist concurrently. Stable cardiac silhouette. Aortic atherosclerosis. Electronically Signed   By: Lowella Grip III M.D.   On: 03/21/2016 07:10      Scheduled Meds: . sodium chloride  10 mL/hr Intravenous Once  . aspirin EC  81 mg Oral Daily  . atorvastatin  10 mg Oral QPM  . diltiazem  120 mg Oral Daily  . FLUoxetine   60 mg Oral Daily  . furosemide  20 mg Intravenous BID  . heparin  5,000 Units Subcutaneous Q8H  . insulin aspart  0-9 Units Subcutaneous TID WC  . insulin glargine  10 Units Subcutaneous QHS  . metoprolol tartrate  25 mg Oral BID  . mirtazapine  15 mg Oral QHS  . [START ON 03/24/2016] pantoprazole  40 mg Intravenous Q12H  . piperacillin-tazobactam (ZOSYN)  IV  3.375 g Intravenous Q8H  . polyethylene glycol  17 g Oral BID  . traZODone  100 mg Oral QHS  . vancomycin  750 mg Intravenous Q24H   Continuous Infusions: . pantoprozole (PROTONIX) infusion 8 mg/hr (03/22/16 [id number])     LOS: 1 day    Time spent: 20 minutes    Faye Ramsay, MD [facility] Pager [phone]  If 7PM-7AM, please contact night-coverage [email or web address] Password [email or web address] 03/22/2016, 1:30 PM

## 2016-03-22 NOTE — Progress Notes (Signed)
Inpatient Diabetes Program Recommendations  AACE/ADA: New Consensus Statement on Inpatient Glycemic Control (2015)  Target Ranges:  Prepandial:   less than 140 mg/dL      Peak postprandial:   less than 180 mg/dL (1-2 hours)      Critically ill patients:  140 - 180 mg/dL   Results for Vickie Murphy, Vickie Murphy (MRN [id number]) as of 03/22/2016 12:03  Ref. Range 03/21/2016 08:33 03/21/2016 12:09 03/21/2016 16:32 03/21/2016 21:49  Glucose-Capillary Latest Ref Range: 65 - 99 mg/dL 316 (H) 251 (H) 302 (H) 389 (H)   Results for Vickie Murphy, Vickie Murphy (MRN [id number]) as of 03/22/2016 12:03  Ref. Range 03/22/2016 07:43  Glucose-Capillary Latest Ref Range: 65 - 99 mg/dL 338 (H)    Home DM Meds: Lantus 10 units daily         Novolog 2-15 units tid per SSI       Metformin 500 mg bid  Current Insulin Orders: Lantus 10 units QHS        Novolog Sensitive Correction Scale/ SSI (0-9 units) TID AC     MD- Please consider the following in-hospital insulin adjustments:  1. Increase Lantus to 15 units QHS  2. Change Novolog Sensitive Correction Scale/ SSI (0-9 units) to Q4 hour coverage (currently ordered as TID AC)      --Will follow patient during hospitalization--  Wyn Quaker RN, MSN, CDE Diabetes Coordinator Inpatient Glycemic Control Team Team Pager: [phone] (8a-5p)

## 2016-03-22 NOTE — Clinical Social Work Note (Signed)
Clinical Social Work Assessment  Patient Details  Name: Vickie Murphy MRN: [id number] Date of Birth: 07/25/1933  Date of referral:  03/22/16               Reason for consult:  Discharge Planning, Facility Placement                Permission sought to share information with:  Family Supports, Customer service manager, Case Optician, dispensing granted to share information::  Yes, Verbal Permission Granted  Name::      Vickie Murphy )  Agency::   ([facility] )  Relationship::   (Daughter )  Contact Information:   [phone])  Housing/Transportation Living arrangements for the past 2 months:  [place] of Information:  Adult Children Patient Interpreter Needed:  None Criminal Activity/Legal Involvement Pertinent to Current Situation/Hospitalization:  No - Comment as needed Significant Relationships:  Adult Children, Significant Other Lives with:  Significant Other Do you feel safe going back to the place where you live?  Yes Need for family participation in patient care:  Yes (Comment)  Care giving concerns:  Patient admitted from SNF, [facility].    Social Worker assessment / plan:  MSW spoke with patient's dtr, Vickie Murphy who confirmed patient was admitted from SNF, [facility] and plans to return once medically stable. Patient has been at [facility] for about 2-3 weeks for ST rehab. MSW has contacted facility and facility is prepared to readmit patient once she is d/c'ed. No further concerns reported at this time. MSW will continue to follow pt and family for continued support and to facilitate pt's d/c needs once stable.   Employment status:  Retired Forensic scientist:  Medicare PT Recommendations:  Not assessed at this time [place] / Referral to community resources:  [place]  Patient/Family's Response to care:  Pt became unresponsive yesterday afternoon. Pt's dtr agreeable to pt's return  to SNF. Pt's dtr and family supportive and involved in care. Dtr appreciated social work intervention.   Patient/Family's Understanding of and Emotional Response to Diagnosis, Current Treatment, and Prognosis:  Family knowledgeable of events and medical interventions.   Emotional Assessment Appearance:    Attitude/Demeanor/Rapport:  Unable to Assess Affect (typically observed):  Unable to Assess Orientation:  Oriented to Self Alcohol / Substance use:  Not Applicable Psych involvement (Current and /or in the community):  No (Comment)  Discharge Needs  Concerns to be addressed:  Denies Needs/Concerns at this time Readmission within the last 30 days:  Yes Current discharge risk:  Dependent with Mobility Barriers to Discharge:  Continued Medical Work up   [employer], MSW [phone] 03/22/2016 10:13 AM

## 2016-03-22 NOTE — Op Note (Signed)
[facility] Patient Name: Vickie Murphy Procedure Date: 03/22/2016 MRN: [id number] Attending MD: Carol Ada , MD Date of Birth: August 17, 1932 CSN: [id number] Age: 82 Admit Type: Inpatient Procedure:                Upper GI endoscopy Indications:              Iron deficiency anemia Providers:                Carol Ada, MD, Zenon Mayo, RN, Despina Pole                            Tech, Technician, Dione Booze, CRNA Referring MD:              Medicines:                Propofol per Anesthesia Complications:            No immediate complications. Estimated Blood Loss:     Estimated blood loss: none. Procedure:                Pre-Anesthesia Assessment:                           - Prior to the procedure, a History and Physical                            was performed, and patient medications and                            allergies were reviewed. The patient's tolerance of                            previous anesthesia was also reviewed. The risks                            and benefits of the procedure and the sedation                            options and risks were discussed with the patient.                            All questions were answered, and informed consent                            was obtained. Prior Anticoagulants: The patient has                            taken no previous anticoagulant or antiplatelet                            agents. ASA Grade Assessment: III - A patient with                            severe systemic disease. After reviewing the risks  and benefits, the patient was deemed in                            satisfactory condition to undergo the procedure.                           - Sedation was administered by an anesthesia                            professional. Deep sedation was attained.                           After obtaining informed consent, the endoscope was                            passed under  direct vision. Throughout the                            procedure, the patient's blood pressure, pulse, and                            oxygen saturations were monitored continuously. The                            [id number]) scope was introduced through the                            mouth, and advanced to the second part of duodenum.                            The upper GI endoscopy was accomplished without                            difficulty. The patient tolerated the procedure                            well. Scope In: Scope Out: Findings:      Blood was noted to be at the UES. This may be from epistaxis. Vocal       cords clear.      The esophagus was normal.      The stomach was normal.      The examined duodenum was normal. Impression:               - Blood was noted to be at the UES. Vocal cords                            clear.                           - Normal esophagus.                           - Normal stomach.                           -  Normal examined duodenum.                           - No specimens collected. Moderate Sedation:      N/A- Per Anesthesia Care Recommendation:           - Return patient to hospital ward for ongoing care.                           - Clear liquid diet.                           - Continue present medications.                           - I will discuss with the POA if a colonoscopy is                            desired. She was not able to tolerate the prep. Procedure Code(s):        --- Professional ---                           [phone], Esophagogastroduodenoscopy, flexible,                            transoral; diagnostic, including collection of                            specimen(s) by brushing or washing, when performed                            (separate procedure) Diagnosis Code(s):        --- Professional ---                           D50.9, Iron deficiency anemia, unspecified CPT copyright 2016 American Medical  Association. All rights reserved. The codes documented in this report are preliminary and upon coder review may  be revised to meet current compliance requirements. Carol Ada, MD Carol Ada, MD 03/22/2016 12:35:12 PM This report has been signed electronically. Number of Addenda: 0

## 2016-03-22 NOTE — Anesthesia Procedure Notes (Addendum)
Procedure Name: MAC Date/Time: 03/22/2016 12:19 PM Performed by: Dione Booze Pre-anesthesia Checklist: Patient identified, Emergency Drugs available, Suction available and Patient being monitored Oxygen Delivery Method: Nasal cannula Placement Confirmation: positive ETCO2

## 2016-03-22 NOTE — H&P (View-Only) (Signed)
Reason for Consult: Anemia Referring Physician: [facility]  Mosie Epstein HPI: This is an 82 year old female with a PMH of DM, CHF, dementia, s/p right mastectomy for breast cancer, GERD, and pneumonia admitted for an anemia.  Her HGB was identified to be in the 5.1 g/dL when she was at the Nursing home.  Subsequently she was sent to the ER.  Per the chart, two weeks ago she underwent an EGD for complaints about nausea, but the examination was unremarkable.  There is no history of a colonoscopy.  Past Medical History:  Diagnosis Date  . Acid reflux   . Breast cancer ([place]) 2002   right  . Cancer [facility])    breast-s/p mastectomy and on chemo  . CHF (congestive heart failure) ([place])    pt reports hx of such  . Depression   . Diabetes mellitus without complication ([place])   . Hyperlipidemia   . Hypertension   . Recurrent UTI     Past Surgical History:  Procedure Laterality Date  . BREAST BIOPSY    . BREAST SURGERY Right   . CARDIAC VALVE REPLACEMENT     aortic, years ago-on no anticoagulation  . KIDNEY STONE SURGERY    . MASTECTOMY Right   . WISDOM TOOTH EXTRACTION      Family History  Problem Relation Age of Onset  . Cancer Father   . Drug abuse Brother   . Breast cancer Neg Hx     Social History:  reports that she has never smoked. She has never used smokeless tobacco. She reports that she does not drink alcohol or use drugs.  Allergies: No Known Allergies  Medications:  Scheduled: . sodium chloride  10 mL/hr Intravenous Once  . aspirin EC  81 mg Oral Daily  . atorvastatin  10 mg Oral QPM  . diltiazem  120 mg Oral Daily  . FLUoxetine  60 mg Oral Daily  . furosemide  40 mg Intravenous BID  . heparin  5,000 Units Subcutaneous Q8H  . insulin aspart  0-9 Units Subcutaneous TID WC  . insulin glargine  10 Units Subcutaneous QHS  . loperamide  2 mg Oral BID  . magnesium sulfate 1 - 4 g bolus IVPB  1 g Intravenous Once  . mirtazapine  15 mg Oral QHS  . [START ON  03/24/2016] pantoprazole  40 mg Intravenous Q12H  . piperacillin-tazobactam (ZOSYN)  IV  3.375 g Intravenous Q8H  . polyethylene glycol  17 g Oral BID  . traZODone  100 mg Oral QHS  . [START ON 03/22/2016] vancomycin  750 mg Intravenous Q24H   Continuous: . pantoprozole (PROTONIX) infusion 8 mg/hr (03/21/16 1309)    Results for orders placed or performed during the hospital encounter of 03/21/16 (from the past 24 hour(s))  CBC with Differential/Platelet     Status: Abnormal   Collection Time: 03/21/16  5:28 AM  Result Value Ref Range   WBC 13.6 (H) 4.0 - 10.5 K/uL   RBC 2.77 (L) 3.87 - 5.11 MIL/uL   Hemoglobin 8.0 (L) 12.0 - 15.0 g/dL   HCT 26.2 (L) 36.0 - 46.0 %   MCV 94.6 78.0 - 100.0 fL   MCH 28.9 26.0 - 34.0 pg   MCHC 30.5 30.0 - 36.0 g/dL   RDW 19.9 (H) 11.5 - 15.5 %   Platelets 247 150 - 400 K/uL   Neutrophils Relative % 85 %   Neutro Abs 11.6 (H) 1.7 - 7.7 K/uL   Lymphocytes Relative 11 %  Lymphs Abs 1.5 0.7 - 4.0 K/uL   Monocytes Relative 3 %   Monocytes Absolute 0.4 0.1 - 1.0 K/uL   Eosinophils Relative 1 %   Eosinophils Absolute 0.2 0.0 - 0.7 K/uL   Basophils Relative 0 %   Basophils Absolute 0.0 0.0 - 0.1 K/uL  Basic metabolic panel     Status: Abnormal   Collection Time: 03/21/16  5:28 AM  Result Value Ref Range   Sodium 137 135 - 145 mmol/L   Potassium 4.4 3.5 - 5.1 mmol/L   Chloride 102 101 - 111 mmol/L   CO2 23 22 - 32 mmol/L   Glucose, Bld 235 (H) 65 - 99 mg/dL   BUN 48 (H) 6 - 20 mg/dL   Creatinine, Ser 1.04 (H) 0.44 - 1.00 mg/dL   Calcium 8.6 (L) 8.9 - 10.3 mg/dL   GFR calc non Af Amer 49 (L) >60 mL/min   GFR calc Af Amer 57 (L) >60 mL/min   Anion gap 12 5 - 15  Type and screen     Status: None (Preliminary result)   Collection Time: 03/21/16  5:28 AM  Result Value Ref Range   ABO/RH(Murphy) O POS    Antibody Screen NEG    Sample Expiration 03/24/2016    Unit Number [id number]    Blood Component Type RED CELLS,LR    Unit division 00    Status of  Unit ALLOCATED    Transfusion Status OK TO TRANSFUSE    Crossmatch Result Compatible    Unit Number [id number]    Blood Component Type RED CELLS,LR    Unit division 00    Status of Unit ALLOCATED    Transfusion Status OK TO TRANSFUSE    Crossmatch Result Compatible   Protime-INR     Status: Abnormal   Collection Time: 03/21/16  5:28 AM  Result Value Ref Range   Prothrombin Time 16.5 (H) 11.4 - 15.2 seconds   INR 1.32   Prepare RBC     Status: None   Collection Time: 03/21/16  5:28 AM  Result Value Ref Range   Order Confirmation ORDER PROCESSED BY BLOOD BANK   ABO/Rh     Status: None   Collection Time: 03/21/16  5:28 AM  Result Value Ref Range   ABO/RH(Murphy) O POS   Brain natriuretic peptide     Status: Abnormal   Collection Time: 03/21/16  5:28 AM  Result Value Ref Range   B Natriuretic Peptide 585.5 (H) 0.0 - 100.0 pg/mL  I-stat chem 8, ed     Status: Abnormal   Collection Time: 03/21/16  6:00 AM  Result Value Ref Range   Sodium 137 135 - 145 mmol/L   Potassium 4.3 3.5 - 5.1 mmol/L   Chloride 100 (L) 101 - 111 mmol/L   BUN 44 (H) 6 - 20 mg/dL   Creatinine, Ser 1.10 (H) 0.44 - 1.00 mg/dL   Glucose, Bld 236 (H) 65 - 99 mg/dL   Calcium, Ion 1.13 1.12 - 1.23 mmol/L   TCO2 24 0 - 100 mmol/L   Hemoglobin 5.8 (LL) 12.0 - 15.0 g/dL   HCT 17.0 (L) 36.0 - 46.0 %   Comment NOTIFIED PHYSICIAN   Reticulocytes     Status: Abnormal   Collection Time: 03/21/16  8:08 AM  Result Value Ref Range   Retic Ct Pct 11.5 (H) 0.4 - 3.1 %   RBC. 1.74 (L) 3.87 - 5.11 MIL/uL   Retic Count, Manual 200.1 (H) 19.0 - 186.0 K/uL  CBG monitoring, ED     Status: Abnormal   Collection Time: 03/21/16  8:33 AM  Result Value Ref Range   Glucose-Capillary 316 (H) 65 - 99 mg/dL  Hemoglobin and hematocrit, blood     Status: Abnormal   Collection Time: 03/21/16  8:36 AM  Result Value Ref Range   Hemoglobin 5.1 (LL) 12.0 - 15.0 g/dL   HCT 17.0 (L) 36.0 - 46.0 %  Hemoglobin and hematocrit, blood      Status: Abnormal   Collection Time: 03/21/16 11:20 AM  Result Value Ref Range   Hemoglobin 5.1 (LL) 12.0 - 15.0 g/dL   HCT 16.5 (L) 36.0 - 46.0 %  Protime-INR     Status: Abnormal   Collection Time: 03/21/16 11:20 AM  Result Value Ref Range   Prothrombin Time 22.5 (H) 11.4 - 15.2 seconds   INR 1.95      Ct Abdomen Pelvis W Contrast  Result Date: 03/21/2016 CLINICAL DATA:  Low hemoglobin and weakness with nausea and history of breast carcinoma EXAM: CT ABDOMEN AND PELVIS WITH CONTRAST TECHNIQUE: Multidetector CT imaging of the abdomen and pelvis was performed using the standard protocol following bolus administration of intravenous contrast. CONTRAST:  168mL ISOVUE-300 IOPAMIDOL (ISOVUE-300) INJECTION 61% COMPARISON:  None. FINDINGS: Lower chest: Small right-sided pleural effusion is noted. Diffuse fibrotic changes are noted in the bases bilaterally. Some superimposed ground-glass changes are noted which may represent acute infiltrate particularly in the right lower lobe. Coronary calcifications are again noted. Hepatobiliary: No masses or other significant abnormality. Pancreas: No mass, inflammatory changes, or other significant abnormality. Spleen: Within normal limits in size and appearance. Adrenals/Urinary Tract: No masses identified. No evidence of hydronephrosis. Stomach/Bowel: The appendix is not well visualized. Diverticular changes noted without evidence of diverticulitis. Mild fecal material throughout the colon is noted consistent with mild constipation. No obstructive changes are seen. Vascular/Lymphatic: Aortoiliac calcifications are noted without aneurysmal dilatation. No significant lymphadenopathy is noted. Reproductive: No acute abnormality noted. Other: No free fluid is noted. Musculoskeletal: Degenerative changes of the lumbar spine are noted. Mild scoliosis is seen. IMPRESSION: Small right-sided pleural effusion. Diffuse fibrotic changes are noted in the bases with some mild  superimposed ground-glass infiltrate particularly in the right lower lobe. Chronic changes as described above. Electronically Signed   By: Inez Catalina M.Murphy.   On: 03/21/2016 10:19   Dg Chest Port 1 View  Result Date: 03/21/2016 CLINICAL DATA:  Shortness of breath.  History of breast carcinoma EXAM: PORTABLE CHEST 1 VIEW COMPARISON:  February 16, 2016 chest radiograph and chest CT November 10, 2015 FINDINGS: There is widespread interstitial edema with patchy alveolar opacity throughout the left mid lung region as well as much the right lung. Heart is borderline enlarged with pulmonary venous hypertension. There is calcification of the mitral annulus. Patient is status post median sternotomy. Adenopathy noted on previous chest CT is less well appreciated by radiography. No bone lesions are evident. Patient is status post aortic valve replacement. Atherosclerotic calcification is noted in the aorta. IMPRESSION: Findings felt to be most consistent with a degree of congestive heart failure, apparently superimposed on chronic interstitial disease. A degree of superimposed pneumonia cannot be excluded radiographically. More than one of these entities may exist concurrently. Stable cardiac silhouette. Aortic atherosclerosis. Electronically Signed   By: Lowella Grip III M.Murphy.   On: 03/21/2016 07:10    ROS:  As stated above in the HPI otherwise negative.  Blood pressure 107/55, pulse 118, temperature 97.6 F (36.4 C), temperature  source Oral, resp. rate 17, SpO2 90 %.    PE: Gen: Alert, Oriented to person and place HEENT:  [place]/AT, EOMI Neck: Supple, no LAD Lungs: CTA Bilaterally CV: RRR without M/G/R ABM: Soft, NTND, +BS Ext: No C/C/E  Assessment/Plan: 1) Anemia. 2) Constipation. 3) Dementia.   Obtain a history of limited with the patient.  I do not know if the information that she provided me was accurate.  I discussed the case with Dr. Candiss Norse and the rectal examination was negative for stool.  I am told  that the EGD was normal in [facility], but it will be reasonable to repeat the EGD.  She will undergo an EGD/Colonoscopy tomorrow, however, I am not certain that she will be able to properly prep for the colonoscopy.  Plan: 1) EGD/Colonoscopy tomorrow.  [place] Vickie Murphy 03/21/2016, 12:12 PM

## 2016-03-22 NOTE — Anesthesia Postprocedure Evaluation (Signed)
Anesthesia Post Note  Patient: Vickie Murphy  Procedure(s) Performed: Procedure(s) (LRB): ESOPHAGOGASTRODUODENOSCOPY (EGD) (N/A)  Patient location during evaluation: PACU Anesthesia Type: MAC Level of consciousness: awake and alert Pain management: pain level controlled Vital Signs Assessment: post-procedure vital signs reviewed and stable Respiratory status: spontaneous breathing, nonlabored ventilation, respiratory function stable and patient connected to nasal cannula oxygen Cardiovascular status: stable and blood pressure returned to baseline Anesthetic complications: no    Last Vitals:  Vitals:   03/22/16 1250 03/22/16 1300  BP: (!) 142/69   Pulse: 93 93  Resp: 17 16  Temp:      Last Pain:  Vitals:   03/22/16 1147  TempSrc: Oral  PainSc:                  [name]

## 2016-03-22 NOTE — Progress Notes (Signed)
Late entry: Patient came to Endo moaning in pain. She stated that her pain was in her bottom. Upon assessment, there was a large bowel movement semi solid appearing black. While cleaning her, she continued to have another large black stool which appeared to get thinner. After cleaning her up, she complained of no pain and appeared to be in a lot less distress.

## 2016-03-22 NOTE — Progress Notes (Signed)
SLP Cancellation Note  Patient Details Name: Vickie Murphy MRN: [id number] DOB: Dec 01, 1932   Cancelled treatment:       Reason Eval/Treat Not Completed: Other (comment) (pt npo for endoscopy/colonoscopy today per GI notes )   [name], [place] [facility] SLP [phone]

## 2016-03-22 NOTE — Interval H&P Note (Signed)
History and Physical Interval Note:  03/22/2016 12:14 PM  Vickie Murphy  has presented today for surgery, with the diagnosis of Anemia  The various methods of treatment have been discussed with the patient and family. After consideration of risks, benefits and other options for treatment, the patient has consented to  Procedure(s): ESOPHAGOGASTRODUODENOSCOPY (EGD) (N/A) as a surgical intervention .  The patient's history has been reviewed, patient examined, no change in status, stable for surgery.  I have reviewed the patient's chart and labs.  Questions were answered to the patient's satisfaction.     Arielis Leonhart D

## 2016-03-22 NOTE — Progress Notes (Signed)
Dried blood noticed on patient's lips. Assessed lips and mouth and appears patient bit her tongue. Dried blood on teeth and tongue.

## 2016-03-23 ENCOUNTER — Encounter (HOSPITAL_COMMUNITY): Payer: Self-pay | Admitting: Gastroenterology

## 2016-03-23 ENCOUNTER — Inpatient Hospital Stay (HOSPITAL_COMMUNITY): Payer: Medicare Other

## 2016-03-23 ENCOUNTER — Encounter: Payer: Self-pay | Admitting: Internal Medicine

## 2016-03-23 DIAGNOSIS — R05 Cough: Secondary | ICD-10-CM | POA: Insufficient documentation

## 2016-03-23 DIAGNOSIS — R059 Cough, unspecified: Secondary | ICD-10-CM | POA: Insufficient documentation

## 2016-03-23 LAB — GLUCOSE, CAPILLARY
Glucose-Capillary: 97 mg/dL (ref 65–99)
Glucose-Capillary: 154 mg/dL — ABNORMAL HIGH (ref 65–99)
Glucose-Capillary: 271 mg/dL — ABNORMAL HIGH (ref 65–99)
Glucose-Capillary: 270 mg/dL — ABNORMAL HIGH (ref 65–99)

## 2016-03-23 LAB — CBC
HEMATOCRIT: 30 % — AB (ref 36.0–46.0)
HEMOGLOBIN: 9.9 g/dL — AB (ref 12.0–15.0)
MCH: 29.8 pg (ref 26.0–34.0)
MCHC: 33 g/dL (ref 30.0–36.0)
MCV: 90.4 fL (ref 78.0–100.0)
Platelets: 95 10*3/uL — ABNORMAL LOW (ref 150–400)
RBC: 3.32 MIL/uL — ABNORMAL LOW (ref 3.87–5.11)
RDW: 16.8 % — ABNORMAL HIGH (ref 11.5–15.5)
WBC: 9.7 10*3/uL (ref 4.0–10.5)

## 2016-03-23 LAB — BASIC METABOLIC PANEL
Anion gap: 9 (ref 5–15)
BUN: 46 mg/dL — AB (ref 6–20)
CHLORIDE: 104 mmol/L (ref 101–111)
CO2: 27 mmol/L (ref 22–32)
CREATININE: 1.05 mg/dL — AB (ref 0.44–1.00)
Calcium: 7.8 mg/dL — ABNORMAL LOW (ref 8.9–10.3)
GFR calc Af Amer: 56 mL/min — ABNORMAL LOW (ref >60)
GFR calc non Af Amer: 48 mL/min — ABNORMAL LOW (ref >60)
GLUCOSE: 100 mg/dL — AB (ref 65–99)
Potassium: 3 mmol/L — ABNORMAL LOW (ref 3.5–5.1)
Sodium: 140 mmol/L (ref 135–145)

## 2016-03-23 MED ORDER — FUROSEMIDE 10 MG/ML IJ SOLN
20.0000 mg | Freq: Every day | INTRAMUSCULAR | Status: DC
  Administered 2016-03-24: 20 mg via INTRAVENOUS
  Filled 2016-03-23: qty 2

## 2016-03-23 MED ORDER — POTASSIUM CHLORIDE 20 MEQ/15ML (10%) PO SOLN
40.0000 meq | Freq: Once | ORAL | Status: AC
  Administered 2016-03-23: 40 meq via ORAL
  Filled 2016-03-23: qty 30

## 2016-03-23 MED ORDER — SODIUM CHLORIDE 0.9 % IV SOLN
INTRAVENOUS | Status: DC
  Administered 2016-03-23: 08:00:00 via INTRAVENOUS

## 2016-03-23 MED ORDER — PEG 3350-KCL-NA BICARB-NACL 420 G PO SOLR
4000.0000 mL | Freq: Once | ORAL | Status: AC
  Administered 2016-03-23: 4000 mL via ORAL

## 2016-03-23 NOTE — Progress Notes (Signed)
Subjective: No acute events.  Objective: Vital signs in last 24 hours: Temp:  [97.6 F (36.4 C)-98.5 F (36.9 C)] 98 F (36.7 C) (08/17 0509) Pulse Rate:  [80-100] 80 (08/17 0509) Resp:  [15-20] 18 (08/17 0509) BP: (115-142)/(53-71) 129/71 (08/17 0509) SpO2:  [91 %-100 %] 93 % (08/17 0509) Weight:  [61.8 kg (136 lb 3.2 oz)] 61.8 kg (136 lb 3.2 oz) (08/17 0509) Last BM Date: 03/22/16  Intake/Output from previous day: 08/16 0701 - 08/17 0700 In: 670 [P.O.:240; I.V.:380; IV Piggyback:50] Out: -  Intake/Output this shift: Total I/O In: 450 [P.O.:120; I.V.:280; IV Piggyback:50] Out: -   General appearance: sleeping GI: soft, non-tender; bowel sounds normal; no masses,  no organomegaly  Lab Results:  Recent Labs  03/21/16 0528  03/21/16 2323 03/22/16 0535 03/23/16 0527  WBC 13.6*  --   --  12.9* 9.7  HGB 8.0*  < > 9.7* 9.2* 9.9*  HCT 26.2*  < > 30.0* 28.0* 30.0*  PLT 247  --   --  129* 95*  < > = values in this interval not displayed. BMET  Recent Labs  03/21/16 0528 03/21/16 0600 03/22/16 0535 03/23/16 0527  NA 137 137 136 140  K 4.4 4.3 4.3 3.0*  CL 102 100* 105 104  CO2 23  --  22 27  GLUCOSE 235* 236* 348* 100*  BUN 48* 44* 62* 46*  CREATININE 1.04* 1.10* 1.30* 1.05*  CALCIUM 8.6*  --  7.4* 7.8*   LFT No results for input(s): PROT, ALBUMIN, AST, ALT, ALKPHOS, BILITOT, BILIDIR, IBILI in the last 72 hours. PT/INR  Recent Labs  03/21/16 1120 03/22/16 0535  LABPROT 22.5* 24.7*  INR 1.95 2.19   Hepatitis Panel No results for input(s): HEPBSAG, HCVAB, HEPAIGM, HEPBIGM in the last 72 hours. C-Diff No results for input(s): CDIFFTOX in the last 72 hours. Fecal Lactopherrin No results for input(s): FECLLACTOFRN in the last 72 hours.  Studies/Results: Ct Chest Wo Contrast  Result Date: 03/22/2016 CLINICAL DATA:  Pneumonia  Increased nausea Chf, dementia Hx breast ca EXAM: CT CHEST WITHOUT CONTRAST TECHNIQUE: Multidetector CT imaging of the chest was  performed following the standard protocol without IV contrast. COMPARISON:  Chest radiograph, 03/21/2016.  Chest CT, 11/10/2015. FINDINGS: Neck base and axilla: There several prominent left neck base lymph nodes, largest measuring 9 mm in short axis. No pathologically enlarged lymph nodes in the neck base or in either axilla. No masses. Postsurgical changes are noted along the right axilla extending along the right pectoralis from previous right breast surgery. Cardiovascular: Heart is top-normal in size. There is dense calcification of the mitral valve annulus. There are dense coronary artery calcifications. Calcified plaque is noted along the aortic arch patch that calcified plaque is noted along the thoracic aorta. Calcified plaque extends into the origin of the innominate artery and left subclavian artery most significantly the left subclavian artery. Mediastinum/Nodes: There is mediastinal and right hilar adenopathy. A prevascular node near the AP window measures 13 mm in short axis. An upper first anterior mediastinal node measures 9 mm short axis. An azygos level right peritracheal to precarinal node measures 14 mm in short axis. Soft tissue surrounding the right hilum is presumed to be adenopathy, with discrete nodes not well-defined. The areas in the inferior right subcarinal right hilar node measuring 17 mm in short axis. Lungs/Pleura: Patchy confluent and ground-glass type airspace lung opacities seen throughout the right lung predominantly in the right upper lobe. There is a geographic appearing ground-glass opacity  are noted in the left upper lobe and, to lesser degree, left lower lobe. This is superimposed on changes of interstitial fibrosis, more evident on the right, which are stable from the prior study. There is a small right pleural effusion. No pneumothorax. Upper Abdomen: No acute findings.  No liver or adrenal masses. Musculoskeletal: Mild degenerative changes of the thoracic spine. No  osteoblastic or osteolytic lesions. IMPRESSION: 1. Findings are most consistent with bilateral pneumonia with patchy airspace consolidation and more diffuse hazy ground-glass opacification throughout the right lung, predominantly in the right upper lobe, and to a lesser degree in the left upper lobe and only mildly in the left lower lobe. There is a small right pleural effusion. Associated mediastinal and right hilar adenopathy is noted presumed reactive. The adenopathy is similar to the previous chest CT and may be chronic. The lung opacities could reflect asymmetric edema as opposed to pneumonia. Electronically Signed   By: Lajean Manes M.D.   On: 03/22/2016 15:42   Ct Abdomen Pelvis W Contrast  Result Date: 03/21/2016 CLINICAL DATA:  Low hemoglobin and weakness with nausea and history of breast carcinoma EXAM: CT ABDOMEN AND PELVIS WITH CONTRAST TECHNIQUE: Multidetector CT imaging of the abdomen and pelvis was performed using the standard protocol following bolus administration of intravenous contrast. CONTRAST:  128mL ISOVUE-300 IOPAMIDOL (ISOVUE-300) INJECTION 61% COMPARISON:  None. FINDINGS: Lower chest: Small right-sided pleural effusion is noted. Diffuse fibrotic changes are noted in the bases bilaterally. Some superimposed ground-glass changes are noted which may represent acute infiltrate particularly in the right lower lobe. Coronary calcifications are again noted. Hepatobiliary: No masses or other significant abnormality. Pancreas: No mass, inflammatory changes, or other significant abnormality. Spleen: Within normal limits in size and appearance. Adrenals/Urinary Tract: No masses identified. No evidence of hydronephrosis. Stomach/Bowel: The appendix is not well visualized. Diverticular changes noted without evidence of diverticulitis. Mild fecal material throughout the colon is noted consistent with mild constipation. No obstructive changes are seen. Vascular/Lymphatic: Aortoiliac calcifications  are noted without aneurysmal dilatation. No significant lymphadenopathy is noted. Reproductive: No acute abnormality noted. Other: No free fluid is noted. Musculoskeletal: Degenerative changes of the lumbar spine are noted. Mild scoliosis is seen. IMPRESSION: Small right-sided pleural effusion. Diffuse fibrotic changes are noted in the bases with some mild superimposed ground-glass infiltrate particularly in the right lower lobe. Chronic changes as described above. Electronically Signed   By: Inez Catalina M.D.   On: 03/21/2016 10:19   Dg Chest Port 1 View  Result Date: 03/21/2016 CLINICAL DATA:  Shortness of breath.  History of breast carcinoma EXAM: PORTABLE CHEST 1 VIEW COMPARISON:  February 16, 2016 chest radiograph and chest CT November 10, 2015 FINDINGS: There is widespread interstitial edema with patchy alveolar opacity throughout the left mid lung region as well as much the right lung. Heart is borderline enlarged with pulmonary venous hypertension. There is calcification of the mitral annulus. Patient is status post median sternotomy. Adenopathy noted on previous chest CT is less well appreciated by radiography. No bone lesions are evident. Patient is status post aortic valve replacement. Atherosclerotic calcification is noted in the aorta. IMPRESSION: Findings felt to be most consistent with a degree of congestive heart failure, apparently superimposed on chronic interstitial disease. A degree of superimposed pneumonia cannot be excluded radiographically. More than one of these entities may exist concurrently. Stable cardiac silhouette. Aortic atherosclerosis. Electronically Signed   By: Lowella Grip III M.D.   On: 03/21/2016 07:10    Medications:  Scheduled: .  sodium chloride  10 mL/hr Intravenous Once  . aspirin EC  81 mg Oral Daily  . atorvastatin  10 mg Oral QPM  . diltiazem  120 mg Oral Daily  . FLUoxetine  60 mg Oral Daily  . furosemide  20 mg Intravenous BID  . insulin aspart  0-9 Units  Subcutaneous TID WC  . insulin glargine  10 Units Subcutaneous QHS  . metoprolol tartrate  25 mg Oral BID  . mirtazapine  15 mg Oral QHS  . [START ON 03/24/2016] pantoprazole  40 mg Intravenous Q12H  . piperacillin-tazobactam (ZOSYN)  IV  3.375 g Intravenous Q8H  . polyethylene glycol  17 g Oral BID  . traZODone  100 mg Oral QHS  . vancomycin  750 mg Intravenous Q24H   Continuous: . pantoprozole (PROTONIX) infusion 8 mg/hr (03/23/16 0501)    Assessment/Plan: 1) Anemia. 2) Melena.   I had a long discussion with her POA.  He and his family want to pursue a colonoscopy and they realize that an NG tube, which is very uncomfortable, may be required to administer the golytely prep.  Plan: 1) Prep for the colonoscopy tomorrow.  If she cannot take the prep on her own and NG tube will be inserted to administer the prep.  LOS: 2 days   Denard Tuminello D 03/23/2016, 6:49 AM

## 2016-03-23 NOTE — Progress Notes (Signed)
PT Cancellation Note  Patient Details Name: Vickie Murphy MRN: [id number] DOB: 1932/11/10   Cancelled Treatment:    Reason Eval/Treat Not Completed: Fatigue/lethargy limiting ability to participate Pt reports she is too tired and weak to participate today.  Pt reports she walks with a walker at baseline.  Per chart, pt is from a skilled facility.   Arville Postlewaite,KATHrine E 03/23/2016, 2:38 PM Carmelia Bake, PT, DPT 03/23/2016 Pager: [phone]

## 2016-03-23 NOTE — Progress Notes (Addendum)
Nursing Note: pulled NG tube 14 cm per radiologist instructions and confirmed placement via auscultation w/ Al,rn.Pt tolerated well.Started pt's golytly bowel prep via tube.wbb

## 2016-03-23 NOTE — Progress Notes (Signed)
Report called to Reynolds, Therapist, sports.  All questions answered.  Will transport pt by bed to 1335.  Spoke with [name] on the phone and made him aware.

## 2016-03-23 NOTE — Progress Notes (Signed)
SLP Cancellation Note  Patient Details Name: Vickie Murphy MRN: [id number] DOB: [birth year]   Cancelled treatment:       Reason Eval/Treat Not Completed: Other (comment) (pt for colonscopy)   [name], [facility] SLP [phone]

## 2016-03-23 NOTE — Progress Notes (Signed)
Nursing Note: Pt says she is unable to drink golytley bowel prep.A: Placed 14 FR Ng tube per policy,Pt tolerated well.Placed order for KUB to verifiy placement.Confirmed placement via ausculation.Assisted by Jaden,NT.wbb

## 2016-03-23 NOTE — Progress Notes (Signed)
Patient ID: Vickie Murphy, female   DOB: Oct 16, 1932, 82 y.o.   MRN: [id number]    PROGRESS NOTE    Vickie Murphy  [id number] DOB: 10/24/1932 DOA: 03/21/2016  PCP: Glendon Axe, MD   Brief Narrative:   82 y.o. female, With history of mild dementia, severe weakness walks with walker at baseline, GERD, DM type II now insulin-dependent, chronic congestive heart failure unknown type no echo in chart, history of breast cancer, depression, dyslipidemia who's been having issues with nausea for the last 3-4 months, underwent EGD at [facility] 2 weeks ago which according to family was completely unremarkable.  Assessment & Plan:   Principal Problem:   Acute hypoxemic respiratory failure (HCC) - CT chest confirmed bilateral pneumonia with patchy airspace consolidation and diffuse groundglass opacification throughout entire right lung and to lesser degree left upper lobe more than lower lobes  - currently on vanc and zosyn, I will continue same regimen for now - Hold off on speech and swallowing evaluation as patient nothing by mouth - continue lasix for now and monitor clinical response  - Echocardiogram on this admission with EF 65% but difficult to evaluate diastolic function    Acute blood loss anemia with underlying constipation, abd pain in the lower quadrants  - s/p three U PRBC transfusion and with Hg up from 5 --> 9.2 --> 9.9 - no evidence of active bleeding - EGD with normal esophagus, stomach, no evidence of bleeding  - appreciate GI team following, plan for colonoscopy in AM - stop Heparin SQ - use SCD for DVT prophylaxis    Constipation - had BM yesterday - looks more comfortable this AM    Thrombocytopenia - Persistent drop in platelets, please note heparin SQ has been discontinued August 16th, 2017 - CBC in AM  Active Problems:   Hypokalemia - Supplement and repeat BMP in the morning    Pre renal azotemia - from acute blood loss anemia - Responded to transfusion  products, creatinine trending down  Diabetes mellitus with long term use Insulin  - reasonable inpatient control  - continue Lantus and SSI    Hypertension, essential - continue cardizem , lasix, metoprolol     Hyperlipidemia - continue statin   DVT prophylaxis: SCD's, discontinue Heparin SQ Code Status: DNR Family Communication: Patient and POA former husband at bedside  Disposition Plan: SNF when medically ready   Consultants:   GI  Procedures:   EGD and colonoscopy 8/15 -->  Antimicrobials:   Vancomycin and Zosyn 8/15 -->   Subjective: Still with abd pain mostly in the lower abd quadrants.   Objective: Vitals:   03/22/16 1300 03/22/16 1420 03/22/16 2036 03/23/16 0509  BP:  115/65 117/60 129/71  Pulse: 93 95 87 80  Resp: 16 20 18 18   Temp:  98.5 F (36.9 C) 97.7 F (36.5 C) 98 F (36.7 C)  TempSrc:  Oral Oral Oral  SpO2: 97% 91% 100% 93%  Weight:    61.8 kg (136 lb 3.2 oz)    Intake/Output Summary (Last 24 hours) at 03/23/16 0729 Last data filed at 03/23/16 [id number]  Gross per 24 hour  Intake              670 ml  Output                0 ml  Net              670 ml   Filed Weights   03/22/16 0618 03/23/16 0509  Weight: 63.6 kg (140 lb 4.8 oz) 61.8 kg (136 lb 3.2 oz)    Examination:  General exam: Appears more comfortable .  Respiratory system: Clear to auscultation. Respiratory effort normal. Cardiovascular system: S1 & S2 heard, RRR. No JVD, murmurs, rubs, gallops or clicks. No pedal edema. Gastrointestinal system: Abdomen is nondistended, soft and tender in lower abd quadrants.  Central nervous system: Alert and oriented. No focal neurological deficits.  Data Reviewed: I have personally reviewed following labs and imaging studies  CBC:  Recent Labs Lab 03/21/16 0528  03/21/16 0836 03/21/16 1120 03/21/16 2323 03/22/16 0535 03/23/16 0527  WBC 13.6*  --   --   --   --  12.9* 9.7  NEUTROABS 11.6*  --   --   --   --   --   --   HGB 8.0*  < >  5.1* 5.1* 9.7* 9.2* 9.9*  HCT 26.2*  < > 17.0* 16.5* 30.0* 28.0* 30.0*  MCV 94.6  --   --   --   --  89.5 90.4  PLT 247  --   --   --   --  129* 95*  < > = values in this interval not displayed. Basic Metabolic Panel:  Recent Labs Lab 03/21/16 0528 03/21/16 0600 03/22/16 0535 03/23/16 0527  NA 137 137 136 140  K 4.4 4.3 4.3 3.0*  CL 102 100* 105 104  CO2 23  --  22 27  GLUCOSE 235* 236* 348* 100*  BUN 48* 44* 62* 46*  CREATININE 1.04* 1.10* 1.30* 1.05*  CALCIUM 8.6*  --  7.4* 7.8*   Coagulation Profile:  Recent Labs Lab 03/21/16 0528 03/21/16 1120 03/22/16 0535  INR 1.32 1.95 2.19   HbA1C:  Recent Labs  03/21/16 0836  HGBA1C 7.2*   CBG:  Recent Labs Lab 03/21/16 1632 03/21/16 2149 03/22/16 0743 03/22/16 1748 03/22/16 2107  GLUCAP 302* 389* 338* 219* 189*   Thyroid Function Tests:  Recent Labs  03/21/16 1120  TSH 1.965   Anemia Panel:  Recent Labs  03/21/16 0808  VITAMINB12 1,176*  FOLATE 46.7  FERRITIN 279  TIBC 277  IRON 15*  RETICCTPCT 11.5*    Recent Results (from the past 240 hour(s))  Culture, blood (routine x 2) Call MD if unable to obtain prior to antibiotics being given     Status: None (Preliminary result)   Collection Time: 03/21/16  7:53 AM  Result Value Ref Range Status   Specimen Description BLOOD LEFT HAND  Final   Special Requests IN PEDIATRIC BOTTLE 1ML  Final   Culture   Final    NO GROWTH 1 DAY Performed at [facility]    Report Status PENDING  Incomplete  Culture, blood (routine x 2) Call MD if unable to obtain prior to antibiotics being given     Status: None (Preliminary result)   Collection Time: 03/21/16  8:07 AM  Result Value Ref Range Status   Specimen Description BLOOD LEFT HAND  Final   Special Requests IN PEDIATRIC BOTTLE 2.5ML  Final   Culture   Final    NO GROWTH 1 DAY Performed at [facility]    Report Status PENDING  Incomplete  MRSA PCR Screening     Status: None   Collection  Time: 03/21/16 11:50 PM  Result Value Ref Range Status   MRSA by PCR NEGATIVE NEGATIVE Final    Comment:        The GeneXpert MRSA Assay (FDA approved  for NASAL specimens only), is one component of a comprehensive MRSA colonization surveillance program. It is not intended to diagnose MRSA infection nor to guide or monitor treatment for MRSA infections.       Radiology Studies: Ct Chest Wo Contrast  Result Date: 03/22/2016 CLINICAL DATA:  Pneumonia  Increased nausea Chf, dementia Hx breast ca EXAM: CT CHEST WITHOUT CONTRAST TECHNIQUE: Multidetector CT imaging of the chest was performed following the standard protocol without IV contrast. COMPARISON:  Chest radiograph, 03/21/2016.  Chest CT, 11/10/2015. FINDINGS: Neck base and axilla: There several prominent left neck base lymph nodes, largest measuring 9 mm in short axis. No pathologically enlarged lymph nodes in the neck base or in either axilla. No masses. Postsurgical changes are noted along the right axilla extending along the right pectoralis from previous right breast surgery. Cardiovascular: Heart is top-normal in size. There is dense calcification of the mitral valve annulus. There are dense coronary artery calcifications. Calcified plaque is noted along the aortic arch patch that calcified plaque is noted along the thoracic aorta. Calcified plaque extends into the origin of the innominate artery and left subclavian artery most significantly the left subclavian artery. Mediastinum/Nodes: There is mediastinal and right hilar adenopathy. A prevascular node near the AP window measures 13 mm in short axis. An upper first anterior mediastinal node measures 9 mm short axis. An azygos level right peritracheal to precarinal node measures 14 mm in short axis. Soft tissue surrounding the right hilum is presumed to be adenopathy, with discrete nodes not well-defined. The areas in the inferior right subcarinal right hilar node measuring 17 mm in short  axis. Lungs/Pleura: Patchy confluent and ground-glass type airspace lung opacities seen throughout the right lung predominantly in the right upper lobe. There is a geographic appearing ground-glass opacity are noted in the left upper lobe and, to lesser degree, left lower lobe. This is superimposed on changes of interstitial fibrosis, more evident on the right, which are stable from the prior study. There is a small right pleural effusion. No pneumothorax. Upper Abdomen: No acute findings.  No liver or adrenal masses. Musculoskeletal: Mild degenerative changes of the thoracic spine. No osteoblastic or osteolytic lesions. IMPRESSION: 1. Findings are most consistent with bilateral pneumonia with patchy airspace consolidation and more diffuse hazy ground-glass opacification throughout the right lung, predominantly in the right upper lobe, and to a lesser degree in the left upper lobe and only mildly in the left lower lobe. There is a small right pleural effusion. Associated mediastinal and right hilar adenopathy is noted presumed reactive. The adenopathy is similar to the previous chest CT and may be chronic. The lung opacities could reflect asymmetric edema as opposed to pneumonia. Electronically Signed   By: Lajean Manes M.D.   On: 03/22/2016 15:42   Ct Abdomen Pelvis W Contrast  Result Date: 03/21/2016 CLINICAL DATA:  Low hemoglobin and weakness with nausea and history of breast carcinoma EXAM: CT ABDOMEN AND PELVIS WITH CONTRAST TECHNIQUE: Multidetector CT imaging of the abdomen and pelvis was performed using the standard protocol following bolus administration of intravenous contrast. CONTRAST:  119mL ISOVUE-300 IOPAMIDOL (ISOVUE-300) INJECTION 61% COMPARISON:  None. FINDINGS: Lower chest: Small right-sided pleural effusion is noted. Diffuse fibrotic changes are noted in the bases bilaterally. Some superimposed ground-glass changes are noted which may represent acute infiltrate particularly in the right lower  lobe. Coronary calcifications are again noted. Hepatobiliary: No masses or other significant abnormality. Pancreas: No mass, inflammatory changes, or other significant abnormality. Spleen: Within normal  limits in size and appearance. Adrenals/Urinary Tract: No masses identified. No evidence of hydronephrosis. Stomach/Bowel: The appendix is not well visualized. Diverticular changes noted without evidence of diverticulitis. Mild fecal material throughout the colon is noted consistent with mild constipation. No obstructive changes are seen. Vascular/Lymphatic: Aortoiliac calcifications are noted without aneurysmal dilatation. No significant lymphadenopathy is noted. Reproductive: No acute abnormality noted. Other: No free fluid is noted. Musculoskeletal: Degenerative changes of the lumbar spine are noted. Mild scoliosis is seen. IMPRESSION: Small right-sided pleural effusion. Diffuse fibrotic changes are noted in the bases with some mild superimposed ground-glass infiltrate particularly in the right lower lobe. Chronic changes as described above. Electronically Signed   By: Inez Catalina M.D.   On: 03/21/2016 10:19      Scheduled Meds: . sodium chloride  10 mL/hr Intravenous Once  . aspirin EC  81 mg Oral Daily  . atorvastatin  10 mg Oral QPM  . diltiazem  120 mg Oral Daily  . FLUoxetine  60 mg Oral Daily  . furosemide  20 mg Intravenous BID  . insulin aspart  0-9 Units Subcutaneous TID WC  . insulin glargine  10 Units Subcutaneous QHS  . metoprolol tartrate  25 mg Oral BID  . mirtazapine  15 mg Oral QHS  . [START ON 03/24/2016] pantoprazole  40 mg Intravenous Q12H  . piperacillin-tazobactam (ZOSYN)  IV  3.375 g Intravenous Q8H  . polyethylene glycol  17 g Oral BID  . polyethylene glycol-electrolytes  4,000 mL Oral Once  . traZODone  100 mg Oral QHS  . vancomycin  750 mg Intravenous Q24H   Continuous Infusions: . sodium chloride    . pantoprozole (PROTONIX) infusion 8 mg/hr (03/23/16 0501)      LOS: 2 days    Time spent: 20 minutes    Faye Ramsay, MD [facility] Pager [phone]  If 7PM-7AM, please contact night-coverage [email or web address] Password [facility] 03/23/2016, 7:29 AM

## 2016-03-24 ENCOUNTER — Encounter (HOSPITAL_COMMUNITY): Payer: Self-pay

## 2016-03-24 ENCOUNTER — Inpatient Hospital Stay (HOSPITAL_COMMUNITY): Payer: Medicare Other | Admitting: Anesthesiology

## 2016-03-24 ENCOUNTER — Encounter (HOSPITAL_COMMUNITY)
Admission: EM | Disposition: A | Discharge status: Hospice | Payer: Self-pay | Source: Home / Self Care | Attending: Internal Medicine

## 2016-03-24 HISTORY — PX: COLONOSCOPY: SHX5424

## 2016-03-24 LAB — BASIC METABOLIC PANEL
ANION GAP: 7 (ref 5–15)
BUN: 27 mg/dL — ABNORMAL HIGH (ref 6–20)
CALCIUM: 7.6 mg/dL — AB (ref 8.9–10.3)
CO2: 29 mmol/L (ref 22–32)
CREATININE: 0.97 mg/dL (ref 0.44–1.00)
Chloride: 105 mmol/L (ref 101–111)
GFR, EST NON AFRICAN AMERICAN: 53 mL/min — AB (ref >60)
Glucose, Bld: 146 mg/dL — ABNORMAL HIGH (ref 65–99)
Potassium: 3.1 mmol/L — ABNORMAL LOW (ref 3.5–5.1)
SODIUM: 141 mmol/L (ref 135–145)

## 2016-03-24 LAB — GLUCOSE, CAPILLARY
GLUCOSE-CAPILLARY: 83 mg/dL (ref 65–99)
GLUCOSE-CAPILLARY: 226 mg/dL — AB (ref 65–99)
Glucose-Capillary: 118 mg/dL — ABNORMAL HIGH (ref 65–99)

## 2016-03-24 LAB — CBC
HCT: 30.9 % — ABNORMAL LOW (ref 36.0–46.0)
Hemoglobin: 9.9 g/dL — ABNORMAL LOW (ref 12.0–15.0)
MCH: 29.3 pg (ref 26.0–34.0)
MCHC: 32 g/dL (ref 30.0–36.0)
MCV: 91.4 fL (ref 78.0–100.0)
PLATELETS: 75 10*3/uL — AB (ref 150–400)
RBC: 3.38 MIL/uL — ABNORMAL LOW (ref 3.87–5.11)
RDW: 16.6 % — AB (ref 11.5–15.5)
WBC: 9 10*3/uL (ref 4.0–10.5)

## 2016-03-24 SURGERY — COLONOSCOPY
Anesthesia: Monitor Anesthesia Care

## 2016-03-24 MED ORDER — LACTATED RINGERS IV SOLN
INTRAVENOUS | Status: DC | PRN
  Administered 2016-03-24: 14:00:00 via INTRAVENOUS

## 2016-03-24 MED ORDER — PROPOFOL 10 MG/ML IV BOLUS
INTRAVENOUS | Status: DC | PRN
  Administered 2016-03-24: 20 mg via INTRAVENOUS

## 2016-03-24 MED ORDER — LEVOFLOXACIN 500 MG PO TABS
500.0000 mg | ORAL_TABLET | Freq: Every evening | ORAL | Status: DC
  Administered 2016-03-24 – 2016-03-27 (×4): 500 mg via ORAL
  Filled 2016-03-24 (×4): qty 1

## 2016-03-24 MED ORDER — LIDOCAINE HCL (CARDIAC) 20 MG/ML IV SOLN
INTRAVENOUS | Status: AC
  Filled 2016-03-24: qty 5

## 2016-03-24 MED ORDER — PROPOFOL 10 MG/ML IV BOLUS
INTRAVENOUS | Status: AC
  Filled 2016-03-24: qty 40

## 2016-03-24 MED ORDER — LIDOCAINE HCL (CARDIAC) 20 MG/ML IV SOLN
INTRAVENOUS | Status: DC | PRN
  Administered 2016-03-24: 50 mg via INTRAVENOUS

## 2016-03-24 MED ORDER — PHENYLEPHRINE 40 MCG/ML (10ML) SYRINGE FOR IV PUSH (FOR BLOOD PRESSURE SUPPORT)
PREFILLED_SYRINGE | INTRAVENOUS | Status: AC
  Filled 2016-03-24: qty 10

## 2016-03-24 MED ORDER — PROPOFOL 500 MG/50ML IV EMUL
INTRAVENOUS | Status: DC | PRN
  Administered 2016-03-24: 75 ug/kg/min via INTRAVENOUS

## 2016-03-24 MED ORDER — POTASSIUM CHLORIDE CRYS ER 20 MEQ PO TBCR
40.0000 meq | EXTENDED_RELEASE_TABLET | Freq: Once | ORAL | Status: DC
Start: 2016-03-24 — End: 2016-03-29
  Filled 2016-03-24: qty 2

## 2016-03-24 MED ORDER — PHENYLEPHRINE HCL 10 MG/ML IJ SOLN
INTRAMUSCULAR | Status: DC | PRN
  Administered 2016-03-24 (×8): 80 ug via INTRAVENOUS

## 2016-03-24 MED ORDER — ONDANSETRON HCL 4 MG/2ML IJ SOLN
INTRAMUSCULAR | Status: AC
  Filled 2016-03-24: qty 2

## 2016-03-24 NOTE — Anesthesia Postprocedure Evaluation (Signed)
Anesthesia Post Note  Patient: Vickie Murphy  Procedure(s) Performed: Procedure(s) (LRB): COLONOSCOPY (N/A)  Patient location during evaluation: PACU Anesthesia Type: MAC Level of consciousness: awake and alert Pain management: pain level controlled Vital Signs Assessment: post-procedure vital signs reviewed and stable Respiratory status: spontaneous breathing, nonlabored ventilation, respiratory function stable and patient connected to nasal cannula oxygen Cardiovascular status: stable and blood pressure returned to baseline Anesthetic complications: no    Last Vitals:  Vitals:   03/24/16 1500 03/24/16 1505  BP: (!) 109/44 (!) 103/42  Pulse: 63 61  Resp: 14 15  Temp:      Last Pain:  Vitals:   03/24/16 0555  TempSrc: Oral  PainSc:                  Vickie Murphy,W. EDMOND

## 2016-03-24 NOTE — Progress Notes (Signed)
Pharmacy Antibiotic Note  Vickie Murphy is a 82 y.o. female admitted from [facility] on 03/21/2016 with possible recurrent pneumonia.  Patient has had multiple episodes of pneumonia requiring 3 courses of antibiotics in the last 1 month.  Most recently started on Avelox.  Pt presented with abdominal pain and nausea.  CT scan pending.  Pharmacy has been consulted for vancomycin and zosyn dosing.  8/15 CXR impression = Findings felt to be most consistent with a degree of congestive heart failure, apparently superimposed on chronic interstitial disease. A degree of superimposed pneumonia cannot be excluded radiographically. More than one of these entities may exist concurrently.    Plan: Vancomycin 1g x 1 then 750 mg IV q24h. Zosyn 3.375g IV q8h (4 hour infusion time).  Spoke with Dr. Doyle Askew, plan to de-escalated above today  Weight: 136 lb 3.2 oz (61.8 kg)  Temp (24hrs), Avg:98.3 F (36.8 C), Min:97.7 F (36.5 C), Max:98.7 F (37.1 C)   Recent Labs Lab 03/21/16 0528 03/21/16 0600 03/22/16 0535 03/23/16 0527 03/24/16 0432  WBC 13.6*  --  12.9* 9.7 9.0  CREATININE 1.04* 1.10* 1.30* 1.05* 0.97    Estimated Creatinine Clearance: 39.4 mL/min (by C-G formula based on SCr of 0.97 mg/dL).    No Known Allergies  Antimicrobials this admission: 8/15 Vancomycin >>  8/15 Zosyn >>   Dose adjustments this admission: -  Microbiology results: 8/15 BCx: ngtd 8/15 Sputum: ordered    Thank you for allowing pharmacy to be a part of this patient's care.  Dolly Rias RPh 03/24/2016, 12:01 PM Pager [phone]

## 2016-03-24 NOTE — Progress Notes (Signed)
Patient ID: Vickie Murphy, female   DOB: 1933/02/22, 82 y.o.   MRN: [id number]    PROGRESS NOTE    Vickie Murphy  [id number] DOB: 1933/01/16 DOA: 03/21/2016  PCP: Glendon Axe, MD   Brief Narrative:  82 y.o. female, With history of mild dementia, severe weakness walks with walker at baseline, GERD, DM type II now insulin-dependent, chronic congestive heart failure unknown type no echo in chart, history of breast cancer, depression, dyslipidemia who's been having issues with nausea for the last 3-4 months, underwent EGD at [facility] 2 weeks ago which according to family was completely unremarkable.  Assessment & Plan:   Principal Problem:   Acute hypoxemic respiratory failure (HCC) - CT chest confirmed bilateral pneumonia with patchy airspace consolidation and diffuse groundglass opacification throughout entire right lung and to lesser degree left upper lobe more than lower lobes  - currently on vanc and zosyn, will change to oral Levaquin today, stop ABX on 8/21 which will complete 7 days therapy  - suspect pt will need SLP once colonoscopy done and pt able to take PO - continue lasix for now and monitor - Echocardiogram on this admission with EF 65% but difficult to evaluate diastolic function    Acute blood loss anemia with underlying constipation, abd pain in the lower quadrants  - s/p three U PRBC transfusion and with Hg up from 5 --> 9.2 --> 9.9 --> 9.9 - no evidence of active bleeding - EGD with normal esophagus, stomach, no evidence of bleeding  - appreciate GI team following, colonoscopy today - we have discontinued Heparin SQ 8/16 - use SCD for DVT prophylaxis    Constipation - had BM yesterday - looks more comfortable this AM - I have discussed with POA use of narcotics and sedating medications and its implications in constipation     Thrombocytopenia - Persistent drop in platelets, please note heparin SQ has been discontinued August 16th, 2017 - currently no signs of  active bleeding  - CBC in AM  Active Problems:   Hypokalemia - continue to supplement and repeat BMP in the morning    Pre renal azotemia - from acute blood loss anemia - Responded to transfusion products, creatinine trending down and is WNL this AM  Diabetes mellitus with long term use Insulin  - reasonable inpatient control  - continue Lantus and SSI    Hypertension, essential - continue cardizem , lasix, metoprolol     Hyperlipidemia - continue statin   DVT prophylaxis: SCD's, discontinue Heparin SQ Code Status: DNR Family Communication: Patient and POA former husband [name] over the phone  Disposition Plan: SNF in 1-2 days   Consultants:   GI  Procedures:   EGD 8/15 -->  Colonoscopy 8/18 -->  Antimicrobials:   Vancomycin and Zosyn 8/15 --> 8/18  Levaquin 8/18 --> 8/21   Subjective: Looks much better and reports feeling better.   Objective: Vitals:   03/23/16 1400 03/23/16 2214 03/24/16 0110 03/24/16 0555  BP: 118/62 113/66 124/66 121/63  Pulse: 94 73 72 68  Resp:  18 18 18   Temp: 97.7 F (36.5 C) 98.5 F (36.9 C) 98.7 F (37.1 C) 98.3 F (36.8 C)  TempSrc: Axillary Oral Oral Oral  SpO2: 100% 100% 100% 100%  Weight:        Intake/Output Summary (Last 24 hours) at 03/24/16 0732 Last data filed at 03/23/16 0900  Gross per 24 hour  Intake              240  ml  Output                0 ml  Net              240 ml   Filed Weights   03/22/16 0618 03/23/16 0509  Weight: 63.6 kg (140 lb 4.8 oz) 61.8 kg (136 lb 3.2 oz)    Examination:  General exam: Appears more comfortable .  Respiratory system: Clear to auscultation. Respiratory effort normal. Cardiovascular system: S1 & S2 heard, RRR. No JVD, murmurs, rubs, gallops or clicks. No pedal edema. Gastrointestinal system: Abdomen is nondistended, soft and tender in lower abd quadrants.  Central nervous system: Alert and oriented. No focal neurological deficits.  Data Reviewed: I have personally  reviewed following labs and imaging studies  CBC:  Recent Labs Lab 03/21/16 0528  03/21/16 1120 03/21/16 2323 03/22/16 0535 03/23/16 0527 03/24/16 0432  WBC 13.6*  --   --   --  12.9* 9.7 9.0  NEUTROABS 11.6*  --   --   --   --   --   --   HGB 8.0*  < > 5.1* 9.7* 9.2* 9.9* 9.9*  HCT 26.2*  < > 16.5* 30.0* 28.0* 30.0* 30.9*  MCV 94.6  --   --   --  89.5 90.4 91.4  PLT 247  --   --   --  129* 95* 75*  < > = values in this interval not displayed. Basic Metabolic Panel:  Recent Labs Lab 03/21/16 0528 03/21/16 0600 03/22/16 0535 03/23/16 0527 03/24/16 0432  NA 137 137 136 140 141  K 4.4 4.3 4.3 3.0* 3.1*  CL 102 100* 105 104 105  CO2 23  --  22 27 29   GLUCOSE 235* 236* 348* 100* 146*  BUN 48* 44* 62* 46* 27*  CREATININE 1.04* 1.10* 1.30* 1.05* 0.97  CALCIUM 8.6*  --  7.4* 7.8* 7.6*   Coagulation Profile:  Recent Labs Lab 03/21/16 0528 03/21/16 1120 03/22/16 0535  INR 1.32 1.95 2.19   HbA1C:  Recent Labs  03/21/16 0836  HGBA1C 7.2*   CBG:  Recent Labs Lab 03/22/16 2107 03/23/16 0757 03/23/16 1414 03/23/16 1823 03/23/16 2205  GLUCAP 189* 97 154* 271* 270*   Thyroid Function Tests:  Recent Labs  03/21/16 1120  TSH 1.965   Anemia Panel:  Recent Labs  03/21/16 0808  VITAMINB12 1,176*  FOLATE 46.7  FERRITIN 279  TIBC 277  IRON 15*  RETICCTPCT 11.5*    Recent Results (from the past 240 hour(s))  Culture, blood (routine x 2) Call MD if unable to obtain prior to antibiotics being given     Status: None (Preliminary result)   Collection Time: 03/21/16  7:53 AM  Result Value Ref Range Status   Specimen Description BLOOD LEFT HAND  Final   Special Requests IN PEDIATRIC BOTTLE 1ML  Final   Culture   Final   Report Status PENDING  Incomplete  Culture, blood (routine x 2) Call MD if unable to obtain prior to antibiotics being given     Status: None (Preliminary result)   Collection Time: 03/21/16  8:07 AM  Result Value Ref Range Status    Specimen Description BLOOD LEFT HAND  Final   Culture   Final    NO GROWTH 2 DAYS Performed at [facility]    Report Status PENDING  Incomplete  MRSA PCR Screening     Status: None   Collection Time: 03/21/16 11:50 PM  Result  Value Ref Range Status   MRSA by PCR NEGATIVE NEGATIVE Final    Radiology Studies: Dg Abd 1 View Result Date: 03/23/2016 Tip of nasogastric tube traverses stomach and projects over expected position of the third portion of the duodenum ; consider withdrawal 14 cm to place tip at the expected position of the gastric antrum.   Ct Chest Wo Contrast Result Date: 03/22/2016 Findings are most consistent with bilateral pneumonia with patchy airspace consolidation and more diffuse hazy ground-glass opacification throughout the right lung, predominantly in the right upper lobe, and to a lesser degree in the left upper lobe and only mildly in the left lower lobe. There is a small right pleural effusion. Associated mediastinal and right hilar adenopathy is noted presumed reactive. The adenopathy is similar to the previous chest CT and may be chronic. The lung opacities could reflect asymmetric edema as opposed to pneumonia.    Scheduled Meds: . sodium chloride  10 mL/hr Intravenous Once  . aspirin EC  81 mg Oral Daily  . atorvastatin  10 mg Oral QPM  . diltiazem  120 mg Oral Daily  . FLUoxetine  60 mg Oral Daily  . furosemide  20 mg Intravenous Daily  . insulin aspart  0-9 Units Subcutaneous TID WC  . insulin glargine  10 Units Subcutaneous QHS  . metoprolol tartrate  25 mg Oral BID  . mirtazapine  15 mg Oral QHS  . pantoprazole  40 mg Intravenous Q12H  . piperacillin-tazobactam (ZOSYN)  IV  3.375 g Intravenous Q8H  . polyethylene glycol  17 g Oral BID  . traZODone  100 mg Oral QHS  . vancomycin  750 mg Intravenous Q24H   Continuous Infusions: . sodium chloride 20 mL/hr at 03/23/16 0827  . pantoprozole (PROTONIX) infusion 8 mg/hr (03/24/16 0334)     LOS: 3  days    Time spent: 20 minutes    Faye Ramsay, MD [facility] Pager [phone]  If 7PM-7AM, please contact night-coverage [email or web address] Password [facility] 03/24/2016, 7:32 AM

## 2016-03-24 NOTE — Progress Notes (Signed)
Nursing Note: Bowel prep completed.Pt tolerated well.Pt has been having loose stools.wbb

## 2016-03-24 NOTE — H&P (View-Only) (Signed)
Subjective: No acute events.  Objective: Vital signs in last 24 hours: Temp:  [97.6 F (36.4 C)-98.5 F (36.9 C)] 98 F (36.7 C) (08/17 0509) Pulse Rate:  [80-100] 80 (08/17 0509) Resp:  [15-20] 18 (08/17 0509) BP: (115-142)/(53-71) 129/71 (08/17 0509) SpO2:  [91 %-100 %] 93 % (08/17 0509) Weight:  [61.8 kg (136 lb 3.2 oz)] 61.8 kg (136 lb 3.2 oz) (08/17 0509) Last BM Date: 03/22/16  Intake/Output from previous day: 08/16 0701 - 08/17 0700 In: 670 [P.O.:240; I.V.:380; IV Piggyback:50] Out: -  Intake/Output this shift: Total I/O In: 450 [P.O.:120; I.V.:280; IV Piggyback:50] Out: -   General appearance: sleeping GI: soft, non-tender; bowel sounds normal; no masses,  no organomegaly  Lab Results:  Recent Labs  03/21/16 0528  03/21/16 2323 03/22/16 0535 03/23/16 0527  WBC 13.6*  --   --  12.9* 9.7  HGB 8.0*  < > 9.7* 9.2* 9.9*  HCT 26.2*  < > 30.0* 28.0* 30.0*  PLT 247  --   --  129* 95*  < > = values in this interval not displayed. BMET  Recent Labs  03/21/16 0528 03/21/16 0600 03/22/16 0535 03/23/16 0527  NA 137 137 136 140  K 4.4 4.3 4.3 3.0*  CL 102 100* 105 104  CO2 23  --  22 27  GLUCOSE 235* 236* 348* 100*  BUN 48* 44* 62* 46*  CREATININE 1.04* 1.10* 1.30* 1.05*  CALCIUM 8.6*  --  7.4* 7.8*   LFT No results for input(s): PROT, ALBUMIN, AST, ALT, ALKPHOS, BILITOT, BILIDIR, IBILI in the last 72 hours. PT/INR  Recent Labs  03/21/16 1120 03/22/16 0535  LABPROT 22.5* 24.7*  INR 1.95 2.19   Hepatitis Panel No results for input(s): HEPBSAG, HCVAB, HEPAIGM, HEPBIGM in the last 72 hours. C-Diff No results for input(s): CDIFFTOX in the last 72 hours. Fecal Lactopherrin No results for input(s): FECLLACTOFRN in the last 72 hours.  Studies/Results: Ct Chest Wo Contrast  Result Date: 03/22/2016 CLINICAL DATA:  Pneumonia  Increased nausea Chf, dementia Hx breast ca EXAM: CT CHEST WITHOUT CONTRAST TECHNIQUE: Multidetector CT imaging of the chest was  performed following the standard protocol without IV contrast. COMPARISON:  Chest radiograph, 03/21/2016.  Chest CT, 11/10/2015. FINDINGS: Neck base and axilla: There several prominent left neck base lymph nodes, largest measuring 9 mm in short axis. No pathologically enlarged lymph nodes in the neck base or in either axilla. No masses. Postsurgical changes are noted along the right axilla extending along the right pectoralis from previous right breast surgery. Cardiovascular: Heart is top-normal in size. There is dense calcification of the mitral valve annulus. There are dense coronary artery calcifications. Calcified plaque is noted along the aortic arch patch that calcified plaque is noted along the thoracic aorta. Calcified plaque extends into the origin of the innominate artery and left subclavian artery most significantly the left subclavian artery. Mediastinum/Nodes: There is mediastinal and right hilar adenopathy. A prevascular node near the AP window measures 13 mm in short axis. An upper first anterior mediastinal node measures 9 mm short axis. An azygos level right peritracheal to precarinal node measures 14 mm in short axis. Soft tissue surrounding the right hilum is presumed to be adenopathy, with discrete nodes not well-defined. The areas in the inferior right subcarinal right hilar node measuring 17 mm in short axis. Lungs/Pleura: Patchy confluent and ground-glass type airspace lung opacities seen throughout the right lung predominantly in the right upper lobe. There is a geographic appearing ground-glass opacity  are noted in the left upper lobe and, to lesser degree, left lower lobe. This is superimposed on changes of interstitial fibrosis, more evident on the right, which are stable from the prior study. There is a small right pleural effusion. No pneumothorax. Upper Abdomen: No acute findings.  No liver or adrenal masses. Musculoskeletal: Mild degenerative changes of the thoracic spine. No  osteoblastic or osteolytic lesions. IMPRESSION: 1. Findings are most consistent with bilateral pneumonia with patchy airspace consolidation and more diffuse hazy ground-glass opacification throughout the right lung, predominantly in the right upper lobe, and to a lesser degree in the left upper lobe and only mildly in the left lower lobe. There is a small right pleural effusion. Associated mediastinal and right hilar adenopathy is noted presumed reactive. The adenopathy is similar to the previous chest CT and may be chronic. The lung opacities could reflect asymmetric edema as opposed to pneumonia. Electronically Signed   By: Lajean Manes M.D.   On: 03/22/2016 15:42   Ct Abdomen Pelvis W Contrast  Result Date: 03/21/2016 CLINICAL DATA:  Low hemoglobin and weakness with nausea and history of breast carcinoma EXAM: CT ABDOMEN AND PELVIS WITH CONTRAST TECHNIQUE: Multidetector CT imaging of the abdomen and pelvis was performed using the standard protocol following bolus administration of intravenous contrast. CONTRAST:  17mL ISOVUE-300 IOPAMIDOL (ISOVUE-300) INJECTION 61% COMPARISON:  None. FINDINGS: Lower chest: Small right-sided pleural effusion is noted. Diffuse fibrotic changes are noted in the bases bilaterally. Some superimposed ground-glass changes are noted which may represent acute infiltrate particularly in the right lower lobe. Coronary calcifications are again noted. Hepatobiliary: No masses or other significant abnormality. Pancreas: No mass, inflammatory changes, or other significant abnormality. Spleen: Within normal limits in size and appearance. Adrenals/Urinary Tract: No masses identified. No evidence of hydronephrosis. Stomach/Bowel: The appendix is not well visualized. Diverticular changes noted without evidence of diverticulitis. Mild fecal material throughout the colon is noted consistent with mild constipation. No obstructive changes are seen. Vascular/Lymphatic: Aortoiliac calcifications  are noted without aneurysmal dilatation. No significant lymphadenopathy is noted. Reproductive: No acute abnormality noted. Other: No free fluid is noted. Musculoskeletal: Degenerative changes of the lumbar spine are noted. Mild scoliosis is seen. IMPRESSION: Small right-sided pleural effusion. Diffuse fibrotic changes are noted in the bases with some mild superimposed ground-glass infiltrate particularly in the right lower lobe. Chronic changes as described above. Electronically Signed   By: Inez Catalina M.D.   On: 03/21/2016 10:19   Dg Chest Port 1 View  Result Date: 03/21/2016 CLINICAL DATA:  Shortness of breath.  History of breast carcinoma EXAM: PORTABLE CHEST 1 VIEW COMPARISON:  February 16, 2016 chest radiograph and chest CT November 10, 2015 FINDINGS: There is widespread interstitial edema with patchy alveolar opacity throughout the left mid lung region as well as much the right lung. Heart is borderline enlarged with pulmonary venous hypertension. There is calcification of the mitral annulus. Patient is status post median sternotomy. Adenopathy noted on previous chest CT is less well appreciated by radiography. No bone lesions are evident. Patient is status post aortic valve replacement. Atherosclerotic calcification is noted in the aorta. IMPRESSION: Findings felt to be most consistent with a degree of congestive heart failure, apparently superimposed on chronic interstitial disease. A degree of superimposed pneumonia cannot be excluded radiographically. More than one of these entities may exist concurrently. Stable cardiac silhouette. Aortic atherosclerosis. Electronically Signed   By: Lowella Grip III M.D.   On: 03/21/2016 07:10    Medications:  Scheduled: .  sodium chloride  10 mL/hr Intravenous Once  . aspirin EC  81 mg Oral Daily  . atorvastatin  10 mg Oral QPM  . diltiazem  120 mg Oral Daily  . FLUoxetine  60 mg Oral Daily  . furosemide  20 mg Intravenous BID  . insulin aspart  0-9 Units  Subcutaneous TID WC  . insulin glargine  10 Units Subcutaneous QHS  . metoprolol tartrate  25 mg Oral BID  . mirtazapine  15 mg Oral QHS  . [START ON 03/24/2016] pantoprazole  40 mg Intravenous Q12H  . piperacillin-tazobactam (ZOSYN)  IV  3.375 g Intravenous Q8H  . polyethylene glycol  17 g Oral BID  . traZODone  100 mg Oral QHS  . vancomycin  750 mg Intravenous Q24H   Continuous: . pantoprozole (PROTONIX) infusion 8 mg/hr (03/23/16 0501)    Assessment/Plan: 1) Anemia. 2) Melena.   I had a long discussion with her POA.  He and his family want to pursue a colonoscopy and they realize that an NG tube, which is very uncomfortable, may be required to administer the golytely prep.  Plan: 1) Prep for the colonoscopy tomorrow.  If she cannot take the prep on her own and NG tube will be inserted to administer the prep.  LOS: 2 days   Rein Popov D 03/23/2016, 6:49 AM

## 2016-03-24 NOTE — Clinical Social Work Note (Addendum)
Patient scheduled for colonoscopy today. Currently has NG tube.   [place] MUST PASARR completed: [id number], expires 04/22/16.  Admitted from [facility]. Facility updated and planning to visit patient today.   MSW remains available as needed.   Glendon Axe, MSW [phone] 03/24/2016 11:30 AM

## 2016-03-24 NOTE — Progress Notes (Signed)
Nursing Note: Ng tube removed as was placed per orders for bowel prep [as pt was unable to drink].Almost complete container at the bedside at shift change.Prep completed and pt tolerated well.wbb

## 2016-03-24 NOTE — Op Note (Signed)
[facility] Patient Name: Vickie Murphy Procedure Date: 03/24/2016 MRN: [id number] Attending MD: Carol Ada , MD Date of Birth: [birth year] CSN: [id number] Age: 82 Admit Type: Inpatient Procedure:                Colonoscopy Indications:              Iron deficiency anemia Providers:                Carol Ada, MD, Elmer Ramp. Tilden Dome, RN, Ralene Bathe,                            Technician Referring MD:              Medicines:                Propofol per Anesthesia Complications:            No immediate complications. Estimated Blood Loss:     Estimated blood loss was minimal. Procedure:                Pre-Anesthesia Assessment:                           - Prior to the procedure, a History and Physical                            was performed, and patient medications and                            allergies were reviewed. The patient's tolerance of                            previous anesthesia was also reviewed. The risks                            and benefits of the procedure and the sedation                            options and risks were discussed with the patient.                            All questions were answered, and informed consent                            was obtained. Prior Anticoagulants: The patient has                            taken no previous anticoagulant or antiplatelet                            agents. ASA Grade Assessment: III - A patient with                            severe systemic disease. After reviewing the risks  and benefits, the patient was deemed in                            satisfactory condition to undergo the procedure.                           - Sedation was administered by an anesthesia                            professional. Deep sedation was attained.                           After obtaining informed consent, the colonoscope                            was passed under direct vision. Throughout  the                            procedure, the patient's blood pressure, pulse, and                            oxygen saturations were monitored continuously. The                            [id number]) scope was introduced through                            the anus and advanced to the the cecum, identified                            by appendiceal orifice and ileocecal valve. The                            colonoscopy was somewhat difficult due to                            significant looping. Successful completion of the                            procedure was aided by applying abdominal pressure.                            The patient tolerated the procedure well. The                            quality of the bowel preparation was good. The                            ileocecal valve, appendiceal orifice, and rectum                            were photographed. Findings:      Two semi-sessile polyps were found in the ascending colon and cecum. The       polyps were 6  to 8 mm in size. These polyps were removed with a hot       snare. Resection and retrieval were complete.      Two sessile polyps were found in the descending colon and cecum. The       polyps were 3 to 4 mm in size. These polyps were removed with a cold       snare. Resection and retrieval were complete.      Scattered small and large-mouthed diverticula were found in the entire       colon.      Barotrauma occurred in the cecum and ascending colon. No over site of       bleeding identified. Impression:               - Two 6 to 8 mm polyps in the ascending colon and                            in the cecum, removed with a hot snare. Resected                            and retrieved.                           - Two 3 to 4 mm polyps in the descending colon and                            in the cecum, removed with a cold snare. Resected                            and retrieved.                           -  Diverticulosis in the entire examined colon. Moderate Sedation:      N/A- Per Anesthesia Care Recommendation:           - Patient has a contact number available for                            emergencies. The signs and symptoms of potential                            delayed complications were discussed with the                            patient. Return to normal activities tomorrow.                            Written discharge instructions were provided to the                            patient.                           - Resume previous diet.                           - Continue  present medications.                           - Await pathology results.                           - No repeat colonoscopy due to age.                           - Follow HGB. If the anemia recurs, than a capsule                            endoscopy is recommended.                           - Signing off. Procedure Code(s):        --- Professional ---                           [phone], Colonoscopy, flexible; with removal of                            tumor(s), polyp(s), or other lesion(s) by snare                            technique Diagnosis Code(s):        --- Professional ---                           D12.2, Benign neoplasm of ascending colon                           D12.4, Benign neoplasm of descending colon                           D12.0, Benign neoplasm of cecum                           D50.9, Iron deficiency anemia, unspecified                           K57.30, Diverticulosis of large intestine without                            perforation or abscess without bleeding CPT copyright 2016 [employer]. All rights reserved. The codes documented in this report are preliminary and upon coder review may  be revised to meet current compliance requirements. Carol Ada, MD Carol Ada, MD 03/24/2016 2:33:47 PM This report has been signed electronically. Number of Addenda: 0

## 2016-03-24 NOTE — Transfer of Care (Signed)
Immediate Anesthesia Transfer of Care Note  Patient: Vickie Murphy  Procedure(s) Performed: Procedure(s): COLONOSCOPY (N/A)  Patient Location: PACU  Anesthesia Type:MAC  Level of Consciousness:  sedated, patient cooperative and responds to stimulation  Airway & Oxygen Therapy:Patient Spontanous Breathing and Patient connected to face mask oxgen  Post-op Assessment:  Report given to PACU RN and Post -op Vital signs reviewed and stable  Post vital signs:  Reviewed and stable  Last Vitals:  Vitals:   03/24/16 0555 03/24/16 1335  BP: 121/63 (!) 136/55  Pulse: 68 70  Resp: 18 12  Temp: [id number] C     Complications: No apparent anesthesia complications

## 2016-03-24 NOTE — Progress Notes (Signed)
SLP Cancellation Note  Patient Details Name: Vickie Murphy MRN: [id number] DOB: 06-09-1933   Cancelled treatment:       Reason Eval/Treat Not Completed: Medical issues which prohibited therapy (npo for colonoscopy)   [name], [facility] SLP [phone]

## 2016-03-24 NOTE — Evaluation (Signed)
Physical Therapy Evaluation Patient Details Name: Vickie Murphy MRN: [id number] DOB: [birth year] Today's Date: 03/24/2016   History of Present Illness  82 y.o. female admitted from [facility] with abdominal pain and nausea. She has PMH of mild dementia, severe weakness walks with walker at baseline, GERD, DM type II now insulin-dependent, chronic congestive heart failure unknown type no echo in chart, history of breast cancer, depression, dyslipidemia. She's been having issues with nausea for the last 3-4 months, underwent EGD at [facility] 2 weeks ago which according to family was completely unremarkable. She also has had multiple episodes of pneumonia requiring 3 courses of antibiotics in the last 1 month.  Clinical Impression  Pt admitted with above diagnosis. Pt currently with functional limitations due to the deficits listed below (see PT Problem List). Max assist for bed mobility and transfers. Pt fatigues very quickly. SNF recommended. Pt will benefit from skilled PT to increase their independence and safety with mobility to allow discharge to the venue listed below.       Follow Up Recommendations SNF    Equipment Recommendations  None recommended by PT    Recommendations for Other Services OT consult     Precautions / Restrictions Precautions Precautions: Fall Restrictions Weight Bearing Restrictions: No      Mobility  Bed Mobility Overal bed mobility: Needs Assistance Bed Mobility: Supine to Sit     Supine to sit: Max assist     General bed mobility comments: pt 30%, assist to raise trunk  Transfers Overall transfer level: Needs assistance Equipment used: None Transfers: Sit to/from Stand;Stand Pivot Transfers Sit to Stand: Max assist Stand pivot transfers: Max assist       General transfer comment: pt 30%, assist to rise and to pivot  Ambulation/Gait                Stairs            Wheelchair Mobility    Modified Rankin (Stroke Patients Only)        Balance Overall balance assessment: Needs assistance   Sitting balance-Leahy Scale: Fair Sitting balance - Comments: kyphotic posture in sitting which worsened with time     Standing balance-Leahy Scale: Poor                               Pertinent Vitals/Pain Pain Assessment: No/denies pain    Home Living Family/patient expects to be discharged to:: Skilled nursing facility                 Additional Comments: staff from SNF stated pt walked only during PT, otherwise she was in [facility] or recliner. Pt stated, "I don't remember" when asked if she walked with a RW at SNF.     Prior Function                 Hand Dominance        Extremity/Trunk Assessment   Upper Extremity Assessment: Generalized weakness           Lower Extremity Assessment: Generalized weakness (-4/5 B knees, B feet numb to light touch (pt reports this is baseline))      Cervical / Trunk Assessment: Kyphotic  Communication   Communication: No difficulties  Cognition Arousal/Alertness: Lethargic Behavior During Therapy: WFL for tasks assessed/performed Overall Cognitive Status: No family/caregiver present to determine baseline cognitive functioning (able to follow commands, not able to recall prior functional status)  Memory: Decreased short-term memory              General Comments      Exercises        Assessment/Plan    PT Assessment Patient needs continued PT services  PT Diagnosis Difficulty walking;Generalized weakness   PT Problem List Decreased strength;Decreased activity tolerance;Decreased balance;Decreased mobility  PT Treatment Interventions Functional mobility training;Gait training;DME instruction;Therapeutic activities;Therapeutic exercise;Balance training;Patient/family education   PT Goals (Current goals can be found in the Care Plan section) Acute Rehab PT Goals Patient Stated Goal: none stated PT Goal Formulation: With  patient Time For Goal Achievement: 04/07/16 Potential to Achieve Goals: Good    Frequency Min 3X/week   Barriers to discharge        Co-evaluation               End of Session Equipment Utilized During Treatment: Gait belt;Oxygen Activity Tolerance: Patient limited by fatigue Patient left: in chair;with call bell/phone within reach;with chair alarm set Nurse Communication: Mobility status         Time: [id number] PT Time Calculation (min) (ACUTE ONLY): 23 min   Charges:   PT Evaluation $PT Eval Moderate Complexity: 1 Procedure PT Treatments $Therapeutic Activity: 8-22 mins   PT G Codes:        Philomena Doheny 03/24/2016, 11:41 AM [phone]

## 2016-03-24 NOTE — Interval H&P Note (Signed)
History and Physical Interval Note:  03/24/2016 1:49 PM  Vickie Murphy  has presented today for surgery, with the diagnosis of Anemia and melena  The various methods of treatment have been discussed with the patient and family. After consideration of risks, benefits and other options for treatment, the patient has consented to  Procedure(s): COLONOSCOPY (N/A) as a surgical intervention .  The patient's history has been reviewed, patient examined, no change in status, stable for surgery.  I have reviewed the patient's chart and labs.  Questions were answered to the patient's satisfaction.     Dareon Nunziato D

## 2016-03-24 NOTE — Anesthesia Preprocedure Evaluation (Addendum)
Anesthesia Evaluation  Patient identified by MRN, date of birth, ID band Patient awake    Reviewed: Allergy & Precautions, H&P , NPO status , Patient's Chart, lab work & pertinent test results  Airway Mallampati: III  TM Distance: >3 FB Neck ROM: Full    Dental no notable dental hx. (+) Teeth Intact, Dental Advisory Given   Pulmonary neg pulmonary ROS,    Pulmonary exam normal breath sounds clear to auscultation       Cardiovascular hypertension, Pt. on medications +CHF  + Valvular Problems/Murmurs AS  Rhythm:Regular Rate:Normal + Systolic murmurs    Neuro/Psych Depression negative neurological ROS     GI/Hepatic Neg liver ROS, GERD  Medicated,  Endo/Other  diabetes, Insulin Dependent  Renal/GU negative Renal ROS  negative genitourinary   Musculoskeletal   Abdominal   Peds  Hematology negative hematology ROS (+)   Anesthesia Other Findings   Reproductive/Obstetrics negative OB ROS                            Anesthesia Physical Anesthesia Plan  ASA: III  Anesthesia Plan: MAC   Post-op Pain Management:    Induction: Intravenous  Airway Management Planned: Simple Face Mask  Additional Equipment:   Intra-op Plan:   Post-operative Plan:   Informed Consent: I have reviewed the patients History and Physical, chart, labs and discussed the procedure including the risks, benefits and alternatives for the proposed anesthesia with the patient or authorized representative who has indicated his/her understanding and acceptance.   Dental advisory given  Plan Discussed with: CRNA  Anesthesia Plan Comments:         Anesthesia Quick Evaluation

## 2016-03-24 NOTE — Progress Notes (Signed)
Nursing Note: Verified via phone w/day shift nurse that 40 of potassium was given.wbb

## 2016-03-25 LAB — GLUCOSE, CAPILLARY
GLUCOSE-CAPILLARY: 228 mg/dL — AB (ref 65–99)
GLUCOSE-CAPILLARY: 282 mg/dL — AB (ref 65–99)
Glucose-Capillary: 143 mg/dL — ABNORMAL HIGH (ref 65–99)
Glucose-Capillary: 345 mg/dL — ABNORMAL HIGH (ref 65–99)

## 2016-03-25 LAB — CBC
HEMATOCRIT: 28.1 % — AB (ref 36.0–46.0)
HEMOGLOBIN: 8.9 g/dL — AB (ref 12.0–15.0)
MCH: 29.4 pg (ref 26.0–34.0)
MCHC: 31.7 g/dL (ref 30.0–36.0)
MCV: 92.7 fL (ref 78.0–100.0)
Platelets: 79 10*3/uL — ABNORMAL LOW (ref 150–400)
RBC: 3.03 MIL/uL — AB (ref 3.87–5.11)
RDW: 16.8 % — ABNORMAL HIGH (ref 11.5–15.5)
WBC: 6.5 10*3/uL (ref 4.0–10.5)

## 2016-03-25 LAB — BASIC METABOLIC PANEL
ANION GAP: 8 (ref 5–15)
BUN: 21 mg/dL — ABNORMAL HIGH (ref 6–20)
CHLORIDE: 103 mmol/L (ref 101–111)
CO2: 27 mmol/L (ref 22–32)
Calcium: 7.2 mg/dL — ABNORMAL LOW (ref 8.9–10.3)
Creatinine, Ser: 0.91 mg/dL (ref 0.44–1.00)
GFR calc Af Amer: >60 mL/min (ref >60)
GFR calc non Af Amer: 58 mL/min — ABNORMAL LOW (ref >60)
GLUCOSE: 190 mg/dL — AB (ref 65–99)
POTASSIUM: 3.8 mmol/L (ref 3.5–5.1)
SODIUM: 138 mmol/L (ref 135–145)

## 2016-03-25 NOTE — Progress Notes (Signed)
Patient ID: Vickie Murphy, female   DOB: October 10, 1932, 82 y.o.   MRN: [id number]    PROGRESS NOTE    Trasha Rubert  [id number] DOB: [birth year] DOA: 03/21/2016  PCP: Glendon Axe, MD   Brief Narrative:  82 y.o. female, With history of mild dementia, severe weakness walks with walker at baseline, GERD, DM type II now insulin-dependent, chronic congestive heart failure unknown type no echo in chart, history of breast cancer, depression, dyslipidemia who's been having issues with nausea for the last 3-4 months, underwent EGD at [facility] 2 weeks ago which according to family was completely unremarkable.  Assessment & Plan:   Principal Problem:   Acute hypoxemic respiratory failure (HCC) - improving.  - CT chest confirmed bilateral pneumonia with patchy airspace consolidation and diffuse groundglass opacification throughout entire right lung and to lesser degree left upper lobe more than lower lobes  - started on vanc and zosyn, changed to oral levaquin on 8/18, stop ABX on 8/21 which will complete 7 days therapy  - suspect pt will need SLP once colonoscopy done and pt able to take PO. slp evaluation ordered and pending. .  - continue lasix for now and monitor - Echocardiogram on this admission with EF 65% but difficult to evaluate diastolic function    Acute blood loss anemia with underlying constipation, abd pain in the lower quadrants  - s/p three U PRBC transfusion and with Hg up from 5 --> 9.2 --> 9.9 --> 9.9- 8.9.  - no evidence of active bleeding. - EGD with normal esophagus, stomach, no evidence of bleeding  - appreciate GI team following, colonoscopy shows multiple polyps both in ascending and descending colon and shows diffuse diverticulosis.     Constipation - resolved. Probably from the narcotic pain meds.     Thrombocytopenia - Persistent drop in platelets, please note heparin SQ has been discontinued August 16th, 2017 - currently no signs of active bleeding  - slowing  improving.   Active Problems:   Hypokalemia - continue to supplement and repeat BMP in the morning is within normal limits.     Pre renal azotemia - from acute blood loss anemia - within normal limits.  - resolved with fluifs and blood transfusions.   Diabetes mellitus with long term use Insulin  - reasonable inpatient control  - continue Lantus and SSI CBG (last 3)   Recent Labs  03/24/16 2147 03/25/16 0830 03/25/16 1211  GLUCAP 226* 143* 228*    If cbg's  Increase , will add premeal coverage.     Hypertension, essential - continue cardizem , lasix, metoprolol     Hyperlipidemia - continue statin   DVT prophylaxis: SCD's,  Code Status: DNR Family Communication:none at bedside. Discussed with the patient.  Disposition Plan: SNF in 1-2 days   Consultants:   GI  Procedures:   EGD 8/15 --> Colonoscopy 8/18 --> Two 6 to 8 mm polyps in the ascending colon and in the cecum, removed with a hot snare. Resected and retrieved.Two 3 to 4 mm polyps in the descending colon and in the cecum, removed with a cold snare. Resected and retrieved.- Diverticulosis in the entire examined colon.  Antimicrobials:   Vancomycin and Zosyn 8/15 --> 8/18  Levaquin 8/18 --> 8/21   Subjective: Denies any new complaints.   Objective: Vitals:   03/24/16 2059 03/25/16 0529 03/25/16 0534 03/25/16 1023  BP: (!) 95/50 (!) 89/51 (!) 90/50 (!) 107/51  Pulse:  68 68 75  Resp: 14 16  Temp: 97.4 F (36.3 C) 98 F (36.7 C)    TempSrc: Oral Oral    SpO2: 100% 100%    Weight:  63 kg (138 lb 12.8 oz)      Intake/Output Summary (Last 24 hours) at 03/25/16 1636 Last data filed at 03/25/16 1023  Gross per 24 hour  Intake             1200 ml  Output                0 ml  Net             1200 ml   Filed Weights   03/22/16 0618 03/23/16 0509 03/25/16 0529  Weight: 63.6 kg (140 lb 4.8 oz) 61.8 kg (136 lb 3.2 oz) 63 kg (138 lb 12.8 oz)    Examination:  General exam: Appears more  comfortable .  Respiratory system: Clear to auscultation. Respiratory effort normal. Cardiovascular system: S1 & S2 heard, RRR. No JVD, murmurs, rubs, gallops or clicks. No pedal edema. Gastrointestinal system: Abdomen is nondistended, soft, no tenderness.  Central nervous system: Alert and oriented. No focal neurological deficits.  Data Reviewed: I have personally reviewed following labs and imaging studies  CBC:  Recent Labs Lab 03/21/16 0528  03/21/16 2323 03/22/16 0535 03/23/16 0527 03/24/16 0432 03/25/16 0355  WBC 13.6*  --   --  12.9* 9.7 9.0 6.5  NEUTROABS 11.6*  --   --   --   --   --   --   HGB 8.0*  < > 9.7* 9.2* 9.9* 9.9* 8.9*  HCT 26.2*  < > 30.0* 28.0* 30.0* 30.9* 28.1*  MCV 94.6  --   --  89.5 90.4 91.4 92.7  PLT 247  --   --  129* 95* 75* 79*  < > = values in this interval not displayed. Basic Metabolic Panel:  Recent Labs Lab 03/21/16 0528 03/21/16 0600 03/22/16 0535 03/23/16 0527 03/24/16 0432 03/25/16 0355  NA 137 137 136 140 141 138  K 4.4 4.3 4.3 3.0* 3.1* 3.8  CL 102 100* 105 104 105 103  CO2 23  --  22 27 29 27   GLUCOSE 235* 236* 348* 100* 146* 190*  BUN 48* 44* 62* 46* 27* 21*  CREATININE 1.04* 1.10* 1.30* 1.05* 0.97 0.91  CALCIUM 8.6*  --  7.4* 7.8* 7.6* 7.2*   Coagulation Profile:  Recent Labs Lab 03/21/16 0528 03/21/16 1120 03/22/16 0535  INR 1.32 1.95 2.19   HbA1C: No results for input(s): HGBA1C in the last 72 hours. CBG:  Recent Labs Lab 03/24/16 0736 03/24/16 1645 03/24/16 2147 03/25/16 0830 03/25/16 1211  GLUCAP 118* 83 226* 143* 228*   Thyroid Function Tests: No results for input(s): TSH, T4TOTAL, FREET4, T3FREE, THYROIDAB in the last 72 hours. Anemia Panel: No results for input(s): VITAMINB12, FOLATE, FERRITIN, TIBC, IRON, RETICCTPCT in the last 72 hours.  Recent Results (from the past 240 hour(s))  Culture, blood (routine x 2) Call MD if unable to obtain prior to antibiotics being given     Status: None  (Preliminary result)   Collection Time: 03/21/16  7:53 AM  Result Value Ref Range Status   Specimen Description BLOOD LEFT HAND  Final   Special Requests IN PEDIATRIC BOTTLE 1ML  Final   Culture   Final   Report Status PENDING  Incomplete  Culture, blood (routine x 2) Call MD if unable to obtain prior to antibiotics being given     Status: None (Preliminary  result)   Collection Time: 03/21/16  8:07 AM  Result Value Ref Range Status   Specimen Description BLOOD LEFT HAND  Final   Culture   Final    NO GROWTH 2 DAYS Performed at [facility]    Report Status PENDING  Incomplete  MRSA PCR Screening     Status: None   Collection Time: 03/21/16 11:50 PM  Result Value Ref Range Status   MRSA by PCR NEGATIVE NEGATIVE Final    Radiology Studies: Dg Abd 1 View Result Date: 03/23/2016 Tip of nasogastric tube traverses stomach and projects over expected position of the third portion of the duodenum ; consider withdrawal 14 cm to place tip at the expected position of the gastric antrum.   Ct Chest Wo Contrast Result Date: 03/22/2016 Findings are most consistent with bilateral pneumonia with patchy airspace consolidation and more diffuse hazy ground-glass opacification throughout the right lung, predominantly in the right upper lobe, and to a lesser degree in the left upper lobe and only mildly in the left lower lobe. There is a small right pleural effusion. Associated mediastinal and right hilar adenopathy is noted presumed reactive. The adenopathy is similar to the previous chest CT and may be chronic. The lung opacities could reflect asymmetric edema as opposed to pneumonia.    Scheduled Meds: . aspirin EC  81 mg Oral Daily  . atorvastatin  10 mg Oral QPM  . diltiazem  120 mg Oral Daily  . FLUoxetine  60 mg Oral Daily  . insulin aspart  0-9 Units Subcutaneous TID WC  . insulin glargine  10 Units Subcutaneous QHS  . levofloxacin  500 mg Oral QPM  . metoprolol tartrate  25 mg Oral BID   . mirtazapine  15 mg Oral QHS  . pantoprazole  40 mg Intravenous Q12H  . polyethylene glycol  17 g Oral BID  . potassium chloride  40 mEq Oral Once  . traZODone  100 mg Oral QHS   Continuous Infusions:     LOS: 4 days    Time spent: 12 minutes    Anatole Apollo, MD [facility] Pager [phone]  If 7PM-7AM, please contact night-coverage [email or web address] Password [facility] 03/25/2016, 4:36 PM

## 2016-03-25 NOTE — Progress Notes (Signed)
Nursing Note: On-call paged back. No new orders at this time.Vickie Murphy,k.wbb

## 2016-03-26 LAB — HEMOGLOBIN AND HEMATOCRIT, BLOOD
HCT: 29.1 % — ABNORMAL LOW (ref 36.0–46.0)
HEMOGLOBIN: 9.1 g/dL — AB (ref 12.0–15.0)

## 2016-03-26 LAB — GLUCOSE, CAPILLARY
GLUCOSE-CAPILLARY: 198 mg/dL — AB (ref 65–99)
GLUCOSE-CAPILLARY: 230 mg/dL — AB (ref 65–99)
Glucose-Capillary: 342 mg/dL — ABNORMAL HIGH (ref 65–99)
Glucose-Capillary: 328 mg/dL — ABNORMAL HIGH (ref 65–99)

## 2016-03-26 LAB — CULTURE, BLOOD (ROUTINE X 2)
CULTURE: NO GROWTH
Culture: NO GROWTH

## 2016-03-26 MED ORDER — INSULIN GLARGINE 100 UNIT/ML ~~LOC~~ SOLN
12.0000 [IU] | Freq: Every day | SUBCUTANEOUS | Status: DC
  Administered 2016-03-26 – 2016-04-01 (×7): 12 [IU] via SUBCUTANEOUS
  Filled 2016-03-26 (×8): qty 0.12

## 2016-03-26 MED ORDER — INSULIN ASPART 100 UNIT/ML ~~LOC~~ SOLN
3.0000 [IU] | Freq: Three times a day (TID) | SUBCUTANEOUS | Status: DC
  Administered 2016-03-26 – 2016-03-31 (×12): 3 [IU] via SUBCUTANEOUS

## 2016-03-26 MED ORDER — LIP MEDEX EX OINT
TOPICAL_OINTMENT | CUTANEOUS | Status: DC | PRN
  Administered 2016-03-26: 21:00:00 via TOPICAL
  Filled 2016-03-26: qty 7

## 2016-03-26 NOTE — Progress Notes (Signed)
Attempted to wean patient off of oxygen throughout the day. Patient maintaining between 93-96% saturation. When oxygen was removed, patient complained of shortness of breath. O2 saturation was between 83-85% on room air. 2L of [place] applied with o2 saturation of 97%. Respiratory notified for breathing treatment. Will continue to monitor pt's respiratory status.

## 2016-03-26 NOTE — Progress Notes (Signed)
Patient ID: Vickie Murphy, female   DOB: [birth year], 82 y.o.   MRN: [id number]    PROGRESS NOTE    Vickie Murphy  [id number] DOB: November 08, 1932 DOA: 03/21/2016  PCP: Glendon Axe, MD   Brief Narrative:  82 y.o. female, With history of mild dementia, severe weakness walks with walker at baseline, GERD, DM type II now insulin-dependent, chronic congestive heart failure unknown type no echo in chart, history of breast cancer, depression, dyslipidemia who's been having issues with nausea for the last 3-4 months, underwent EGD at [facility] 2 weeks ago which according to family was completely unremarkable.  Assessment & Plan:   Principal Problem:   Acute hypoxemic respiratory failure (HCC) - improving.  - CT chest confirmed bilateral pneumonia with patchy airspace consolidation and diffuse groundglass opacification throughout entire right lung and to lesser degree left upper lobe more than lower lobes  - started on vanc and zosyn, changed to oral levaquin on 8/18, stop ABX on 8/21 which will complete 7 days therapy  - suspect pt will need SLP once colonoscopy done and pt able to take PO. slp evaluation ordered and pending. .  - continue lasix for now and monitor - Echocardiogram on this admission with EF 65% but difficult to evaluate diastolic function    Acute blood loss anemia with underlying constipation, abd pain in the lower quadrants  - s/p three U PRBC transfusion and with Hg up from 5 --> 9.2 --> 9.9 --> 9.9- 8.9. --> 9.1 - no evidence of active bleeding. - EGD with normal esophagus, stomach, no evidence of bleeding  - appreciate GI team following, colonoscopy shows multiple polyps both in ascending and descending colon and shows diffuse diverticulosis.     Constipation - resolved. Probably from the narcotic pain meds.     Thrombocytopenia - Persistent drop in platelets, please note heparin SQ has been discontinued August 16th, 2017 - currently no signs of active bleeding  -  slowing improving.   Active Problems:   Hypokalemia - continue to supplement and repeat BMP in the morning is within normal limits.     Pre renal azotemia - from acute blood loss anemia - within normal limits.  - resolved with fluids and blood transfusions.   Diabetes mellitus with long term use Insulin  - reasonable inpatient control  - continue Lantus and SSI CBG (last 3)   Recent Labs  03/25/16 2146 03/26/16 0804 03/26/16 1129  GLUCAP 282* 198* 342*    Added premeal coverage.     Hypertension, essential -controlled.  - continue cardizem , lasix, metoprolol     Hyperlipidemia - continue statin   DVT prophylaxis: SCD's,  Code Status: DNR Family Communication:none at bedside. Discussed with the patient.  Disposition Plan: SNF  tomorrow  Consultants:   GI  Procedures:   EGD 8/15 --> Colonoscopy 8/18 --> Two 6 to 8 mm polyps in the ascending colon and in the cecum, removed with a hot snare. Resected and retrieved.Two 3 to 4 mm polyps in the descending colon and in the cecum, removed with a cold snare. Resected and retrieved.- Diverticulosis in the entire examined colon.  Antimicrobials:   Vancomycin and Zosyn 8/15 --> 8/18  Levaquin 8/18 --> 8/21   Subjective: Denies any new complaints.   Objective: Vitals:   03/25/16 2203 03/26/16 0616 03/26/16 0951 03/26/16 1159  BP: 121/63 119/65 118/61   Pulse: 85 73 75   Resp: 14 15    Temp: 97.4 F (36.3 C) 97.5 F (36.4  C)    TempSrc: Oral Oral    SpO2: 100% 100%  97%  Weight:        Intake/Output Summary (Last 24 hours) at 03/26/16 1353 Last data filed at 03/26/16 1011  Gross per 24 hour  Intake             1235 ml  Output                0 ml  Net             1235 ml   Filed Weights   03/22/16 0618 03/23/16 0509 03/25/16 0529  Weight: 63.6 kg (140 lb 4.8 oz) 61.8 kg (136 lb 3.2 oz) 63 kg (138 lb 12.8 oz)    Examination:  General exam: Appears more comfortable . On 2 lit [place]  oxygen. Respiratory system: Clear to auscultation. Respiratory effort normal. Cardiovascular system: S1 & S2 heard, RRR. No JVD, murmurs, rubs, gallops or clicks. No pedal edema. Gastrointestinal system: Abdomen is nondistended, soft, no tenderness.  Central nervous system: Alert and oriented. No focal neurological deficits.  Data Reviewed: I have personally reviewed following labs and imaging studies  CBC:  Recent Labs Lab 03/21/16 0528  03/22/16 0535 03/23/16 0527 03/24/16 0432 03/25/16 0355 03/26/16 1143  WBC 13.6*  --  12.9* 9.7 9.0 6.5  --   NEUTROABS 11.6*  --   --   --   --   --   --   HGB 8.0*  < > 9.2* 9.9* 9.9* 8.9* 9.1*  HCT 26.2*  < > 28.0* 30.0* 30.9* 28.1* 29.1*  MCV 94.6  --  89.5 90.4 91.4 92.7  --   PLT 247  --  129* 95* 75* 79*  --   < > = values in this interval not displayed. Basic Metabolic Panel:  Recent Labs Lab 03/21/16 0528 03/21/16 0600 03/22/16 0535 03/23/16 0527 03/24/16 0432 03/25/16 0355  NA 137 137 136 140 141 138  K 4.4 4.3 4.3 3.0* 3.1* 3.8  CL 102 100* 105 104 105 103  CO2 23  --  22 27 29 27   GLUCOSE 235* 236* 348* 100* 146* 190*  BUN 48* 44* 62* 46* 27* 21*  CREATININE 1.04* 1.10* 1.30* 1.05* 0.97 0.91  CALCIUM 8.6*  --  7.4* 7.8* 7.6* 7.2*   Coagulation Profile:  Recent Labs Lab 03/21/16 0528 03/21/16 1120 03/22/16 0535  INR 1.32 1.95 2.19   HbA1C: No results for input(s): HGBA1C in the last 72 hours. CBG:  Recent Labs Lab 03/25/16 1211 03/25/16 1701 03/25/16 2146 03/26/16 0804 03/26/16 1129  GLUCAP 228* 345* 282* 198* 342*   Thyroid Function Tests: No results for input(s): TSH, T4TOTAL, FREET4, T3FREE, THYROIDAB in the last 72 hours. Anemia Panel: No results for input(s): VITAMINB12, FOLATE, FERRITIN, TIBC, IRON, RETICCTPCT in the last 72 hours.  Recent Results (from the past 240 hour(s))  Culture, blood (routine x 2) Call MD if unable to obtain prior to antibiotics being given     Status: None (Preliminary  result)   Collection Time: 03/21/16  7:53 AM  Result Value Ref Range Status   Specimen Description BLOOD LEFT HAND  Final   Special Requests IN PEDIATRIC BOTTLE 1ML  Final   Culture   Final   Report Status PENDING  Incomplete  Culture, blood (routine x 2) Call MD if unable to obtain prior to antibiotics being given     Status: None (Preliminary result)   Collection Time: 03/21/16  8:07 AM  Result Value Ref Range Status   Specimen Description BLOOD LEFT HAND  Final   Culture   Final    NO GROWTH 2 DAYS Performed at [facility]    Report Status PENDING  Incomplete  MRSA PCR Screening     Status: None   Collection Time: 03/21/16 11:50 PM  Result Value Ref Range Status   MRSA by PCR NEGATIVE NEGATIVE Final    Radiology Studies: Dg Abd 1 View Result Date: 03/23/2016 Tip of nasogastric tube traverses stomach and projects over expected position of the third portion of the duodenum ; consider withdrawal 14 cm to place tip at the expected position of the gastric antrum.   Ct Chest Wo Contrast Result Date: 03/22/2016 Findings are most consistent with bilateral pneumonia with patchy airspace consolidation and more diffuse hazy ground-glass opacification throughout the right lung, predominantly in the right upper lobe, and to a lesser degree in the left upper lobe and only mildly in the left lower lobe. There is a small right pleural effusion. Associated mediastinal and right hilar adenopathy is noted presumed reactive. The adenopathy is similar to the previous chest CT and may be chronic. The lung opacities could reflect asymmetric edema as opposed to pneumonia.    Scheduled Meds: . aspirin EC  81 mg Oral Daily  . atorvastatin  10 mg Oral QPM  . diltiazem  120 mg Oral Daily  . FLUoxetine  60 mg Oral Daily  . insulin aspart  0-9 Units Subcutaneous TID WC  . insulin glargine  10 Units Subcutaneous QHS  . levofloxacin  500 mg Oral QPM  . metoprolol tartrate  25 mg Oral BID  .  mirtazapine  15 mg Oral QHS  . pantoprazole  40 mg Intravenous Q12H  . polyethylene glycol  17 g Oral BID  . potassium chloride  40 mEq Oral Once  . traZODone  100 mg Oral QHS   Continuous Infusions:     LOS: 5 days    Time spent: 30 minutes    Vickie Penninger, MD [facility] Pager [phone]  If 7PM-7AM, please contact night-coverage [email or web address] Password [email or web address] 03/26/2016, 1:53 PM

## 2016-03-27 ENCOUNTER — Encounter (HOSPITAL_COMMUNITY): Payer: Self-pay | Admitting: Gastroenterology

## 2016-03-27 ENCOUNTER — Inpatient Hospital Stay (HOSPITAL_COMMUNITY): Payer: Medicare Other

## 2016-03-27 LAB — GLUCOSE, CAPILLARY
GLUCOSE-CAPILLARY: 156 mg/dL — AB (ref 65–99)
GLUCOSE-CAPILLARY: 195 mg/dL — AB (ref 65–99)
GLUCOSE-CAPILLARY: 226 mg/dL — AB (ref 65–99)
Glucose-Capillary: 147 mg/dL — ABNORMAL HIGH (ref 65–99)
Glucose-Capillary: 172 mg/dL — ABNORMAL HIGH (ref 65–99)

## 2016-03-27 LAB — TROPONIN I: TROPONIN I: 1.2 ng/mL — AB (ref <0.03)

## 2016-03-27 MED ORDER — PANTOPRAZOLE SODIUM 40 MG PO TBEC
40.0000 mg | DELAYED_RELEASE_TABLET | Freq: Two times a day (BID) | ORAL | Status: DC
  Administered 2016-03-28 – 2016-04-01 (×11): 40 mg via ORAL
  Filled 2016-03-27 (×11): qty 1

## 2016-03-27 MED ORDER — GI COCKTAIL ~~LOC~~
30.0000 mL | Freq: Once | ORAL | Status: AC
  Administered 2016-03-28: 30 mL via ORAL
  Filled 2016-03-27: qty 30

## 2016-03-27 MED ORDER — DIPHENHYDRAMINE HCL 25 MG PO CAPS: 25.0000 mg | ORAL_CAPSULE | Freq: Four times a day (QID) | ORAL | Status: DC | PRN

## 2016-03-27 MED ORDER — FUROSEMIDE 10 MG/ML IJ SOLN
40.0000 mg | Freq: Once | INTRAMUSCULAR | Status: AC
  Administered 2016-03-27: 40 mg via INTRAVENOUS
  Filled 2016-03-27: qty 4

## 2016-03-27 MED ORDER — LEVOFLOXACIN 500 MG PO TABS
500.0000 mg | ORAL_TABLET | Freq: Every evening | ORAL | Status: AC
  Administered 2016-03-28: 500 mg via ORAL
  Filled 2016-03-27: qty 1

## 2016-03-27 MED ORDER — PROMETHAZINE HCL 25 MG PO TABS
25.0000 mg | ORAL_TABLET | Freq: Four times a day (QID) | ORAL | Status: DC | PRN
  Administered 2016-03-28: 25 mg via ORAL
  Filled 2016-03-27: qty 1

## 2016-03-27 NOTE — Progress Notes (Signed)
Pharmacy IV to PO conversion  The patient is receiving Pantoprazole and Diphenhydramine by the intravenous route.  Based on criteria approved by the Pharmacy and [place], the medications are being converted to the equivalent oral dose form.   No active GI bleeding or impaired absorption  Not s/p esophagectomy  Documented ability to take oral medications for > 24 hr  Plan to continue treatment for at least 1 day  For diphenhydramine:  Not prescribed to treat or prevent a severe allergic reaction   Not prescribed as premedication prior to receiving blood product, biologic medication, antimicrobial, or chemotherapy agent   The patient has tolerated at least one dose of an oral or enteral medication   The patient has no evidence of active gastrointestinal bleeding or impaired GI absorption (gastrectomy, short bowel, patient on TNA or NPO).   The patient is not undergoing procedural sedation   If you have any questions about this conversion, please contact the Pharmacy Department (ext [phone]).  Thank you.  Reuel Boom, PharmD Pager: [phone] 03/27/2016, 2:58 PM

## 2016-03-27 NOTE — Progress Notes (Signed)
PT Cancellation Note  Patient Details Name: Vickie Murphy MRN: [id number] DOB: 07-03-1933   Cancelled Treatment:     attempted x 2.  AM pt not feeling well then pm resting soundly.  Will attempt to see again as schedule permits.   Rica Koyanagi  PTA [facility]  Acute  Rehab Pager      [phone]

## 2016-03-27 NOTE — Clinical Social Work Note (Signed)
Patient has bed at SNF[facility] and [facility] once stable for d/c.   Glendon Axe, MSW [phone] 03/27/2016 1:53 PM

## 2016-03-27 NOTE — Plan of Care (Signed)
Problem: Skin Integrity: Goal: Risk for impaired skin integrity will decrease Outcome: Not Progressing Incontinent x 2, moist often

## 2016-03-27 NOTE — Progress Notes (Addendum)
Nursing Note: Phoned  POA and left message of transfer to room 1421 telemerty bed and that pt is stable but had c/o CP and had eleavted troponin that warranted  Cardiac monitoring.wbb.Called daughter but unable to reach.wbb

## 2016-03-27 NOTE — Progress Notes (Signed)
CRITICAL VALUE ALERT  Critical value received: Troponin 1.20  Date of notification:  03/27/2016   Time of notification:  19:20  Critical value read back: Yes   Nurse who received alert:  Kirra Verga  MD notified (1st page):  Raliegh Ip Schorr  Time of first page:  19:21  MD notified (2nd page):  Time of second page:  Responding MD:    Time MD responded:

## 2016-03-27 NOTE — Progress Notes (Signed)
Patient ID: Vickie Murphy, female   DOB: 21-Jul-1933, 82 y.o.   MRN: [id number]    PROGRESS NOTE    Vickie Murphy  [id number] DOB: [birth year] DOA: 03/21/2016  PCP: Glendon Axe, MD   Brief Narrative:  82 y.o. female, With history of mild dementia, severe weakness walks with walker at baseline, GERD, DM type II now insulin-dependent, chronic congestive heart failure unknown type no echo in chart, history of breast cancer, depression, dyslipidemia who's been having issues with nausea for the last 3-4 months, underwent EGD at [facility] 2 weeks ago which according to family was completely unremarkable.  Assessment & Plan:   Principal Problem:   Acute hypoxemic respiratory failure (HCC) - improving.  - CT chest confirmed bilateral pneumonia with patchy airspace consolidation and diffuse groundglass opacification throughout entire right lung and to lesser degree left upper lobe more than lower lobes  - started on vanc and zosyn, changed to oral antibiotics and completed 7 day course. Will order one more dose of levaquin before discharge tomorrow.  - repeat CXR show interstitial edema, hence ordered one dose of IV lasix.  - we were unable to wean her off oxygen. We will try again tomorrow.  -  slp evaluation ordered and pending. .   - Echocardiogram on this admission with EF 65% but difficult to evaluate diastolic function    Acute blood loss anemia with underlying constipation, abd pain in the lower quadrants  - s/p three U PRBC transfusion and with Hg up from 5 --> 9.2 --> 9.9 --> 9.9- 8.9. --> 9.1 - no evidence of active bleeding. - EGD with normal esophagus, stomach, no evidence of bleeding  - appreciate GI team following, colonoscopy shows multiple polyps both in ascending and descending colon and shows diffuse diverticulosis.     Constipation - resolved. Probably from the narcotic pain meds.     Thrombocytopenia - Persistent drop in platelets, please note heparin SQ has been  discontinued August 16th, 2017 - currently no signs of active bleeding  - slowing improving.   Active Problems:   Hypokalemia - continue to supplement and repeat BMP in the morning is within normal limits.     Pre renal azotemia - from acute blood loss anemia - within normal limits.  - resolved with fluids and blood transfusions.   Diabetes mellitus with long term use Insulin  - reasonable inpatient control  - continue Lantus and SSI CBG (last 3)   Recent Labs  03/27/16 1253 03/27/16 1622 03/27/16 1817  GLUCAP 147* 195* 226*    Added premeal coverage.     Hypertension, essential -controlled.  - continue cardizem , lasix, metoprolol     Hyperlipidemia - continue statin   Chest pain/ ?GERD: One dose of gi cocktail ordered,  Troponin pending.  EKG 12 lead ordered.   DVT prophylaxis: SCD's,  Code Status: DNR Family Communication: discussed with husband at bedside.  Disposition Plan: SNF  tomorrow  Consultants:   GI  Procedures:   EGD 8/15 --> Colonoscopy 8/18 --> Two 6 to 8 mm polyps in the ascending colon and in the cecum, removed with a hot snare. Resected and retrieved.Two 3 to 4 mm polyps in the descending colon and in the cecum, removed with a cold snare. Resected and retrieved.- Diverticulosis in the entire examined colon.  Antimicrobials:   Vancomycin and Zosyn 8/15 --> 8/18  Levaquin 8/18 --> 8/21   Subjective: Denies any new complaints.   Objective: Vitals:   03/27/16 1344 03/27/16 1435  03/27/16 1543 03/27/16 1600  BP: 129/70 (!) 146/70  137/69  Pulse: 79 85  81  Resp: 15 20    Temp: 98 F (36.7 C) 97 F (36.1 C)  97 F (36.1 C)  TempSrc: Oral Oral  Axillary  SpO2: 100% 100% 95% 95%  Weight:      Height:        Intake/Output Summary (Last 24 hours) at 03/27/16 1825 Last data filed at 03/27/16 1600  Gross per 24 hour  Intake              320 ml  Output                0 ml  Net              320 ml   Filed Weights   03/23/16  0509 03/25/16 0529 03/27/16 1208  Weight: 61.8 kg (136 lb 3.2 oz) 63 kg (138 lb 12.8 oz) 64 kg (141 lb 1.5 oz)    Examination:  General exam: Appears more comfortable . On 2 lit [place] oxygen. Respiratory system: Clear to auscultation. Respiratory effort normal. Cardiovascular system: S1 & S2 heard, RRR. No JVD, murmurs, rubs, gallops or clicks. No pedal edema. Gastrointestinal system: Abdomen is nondistended, soft, no tenderness.  Central nervous system: Alert and oriented. No focal neurological deficits.  Data Reviewed: I have personally reviewed following labs and imaging studies  CBC:  Recent Labs Lab 03/21/16 0528  03/22/16 0535 03/23/16 0527 03/24/16 0432 03/25/16 0355 03/26/16 1143  WBC 13.6*  --  12.9* 9.7 9.0 6.5  --   NEUTROABS 11.6*  --   --   --   --   --   --   HGB 8.0*  < > 9.2* 9.9* 9.9* 8.9* 9.1*  HCT 26.2*  < > 28.0* 30.0* 30.9* 28.1* 29.1*  MCV 94.6  --  89.5 90.4 91.4 92.7  --   PLT 247  --  129* 95* 75* 79*  --   < > = values in this interval not displayed. Basic Metabolic Panel:  Recent Labs Lab 03/21/16 0528 03/21/16 0600 03/22/16 0535 03/23/16 0527 03/24/16 0432 03/25/16 0355  NA 137 137 136 140 141 138  K 4.4 4.3 4.3 3.0* 3.1* 3.8  CL 102 100* 105 104 105 103  CO2 23  --  22 27 29 27   GLUCOSE 235* 236* 348* 100* 146* 190*  BUN 48* 44* 62* 46* 27* 21*  CREATININE 1.04* 1.10* 1.30* 1.05* 0.97 0.91  CALCIUM 8.6*  --  7.4* 7.8* 7.6* 7.2*   Coagulation Profile:  Recent Labs Lab 03/21/16 0528 03/21/16 1120 03/22/16 0535  INR 1.32 1.95 2.19   HbA1C: No results for input(s): HGBA1C in the last 72 hours. CBG:  Recent Labs Lab 03/26/16 2125 03/27/16 0748 03/27/16 1253 03/27/16 1622 03/27/16 1817  GLUCAP 230* 156* 147* 195* 226*   Thyroid Function Tests: No results for input(s): TSH, T4TOTAL, FREET4, T3FREE, THYROIDAB in the last 72 hours. Anemia Panel: No results for input(s): VITAMINB12, FOLATE, FERRITIN, TIBC, IRON, RETICCTPCT in  the last 72 hours.  Recent Results (from the past 240 hour(s))  Culture, blood (routine x 2) Call MD if unable to obtain prior to antibiotics being given     Status: None (Preliminary result)   Collection Time: 03/21/16  7:53 AM  Result Value Ref Range Status   Specimen Description BLOOD LEFT HAND  Final   Special Requests IN PEDIATRIC BOTTLE 1ML  Final   Culture  Final   Report Status PENDING  Incomplete  Culture, blood (routine x 2) Call MD if unable to obtain prior to antibiotics being given     Status: None (Preliminary result)   Collection Time: 03/21/16  8:07 AM  Result Value Ref Range Status   Specimen Description BLOOD LEFT HAND  Final   Culture   Final    NO GROWTH 2 DAYS Performed at [facility]    Report Status PENDING  Incomplete  MRSA PCR Screening     Status: None   Collection Time: 03/21/16 11:50 PM  Result Value Ref Range Status   MRSA by PCR NEGATIVE NEGATIVE Final    Radiology Studies: Dg Abd 1 View Result Date: 03/23/2016 Tip of nasogastric tube traverses stomach and projects over expected position of the third portion of the duodenum ; consider withdrawal 14 cm to place tip at the expected position of the gastric antrum.   Ct Chest Wo Contrast Result Date: 03/22/2016 Findings are most consistent with bilateral pneumonia with patchy airspace consolidation and more diffuse hazy ground-glass opacification throughout the right lung, predominantly in the right upper lobe, and to a lesser degree in the left upper lobe and only mildly in the left lower lobe. There is a small right pleural effusion. Associated mediastinal and right hilar adenopathy is noted presumed reactive. The adenopathy is similar to the previous chest CT and may be chronic. The lung opacities could reflect asymmetric edema as opposed to pneumonia.    Scheduled Meds: . aspirin EC  81 mg Oral Daily  . atorvastatin  10 mg Oral QPM  . diltiazem  120 mg Oral Daily  . FLUoxetine  60 mg Oral  Daily  . gi cocktail  30 mL Oral Once  . insulin aspart  0-9 Units Subcutaneous TID WC  . insulin aspart  3 Units Subcutaneous TID WC  . insulin glargine  12 Units Subcutaneous QHS  . levofloxacin  500 mg Oral QPM  . metoprolol tartrate  25 mg Oral BID  . mirtazapine  15 mg Oral QHS  . pantoprazole  40 mg Oral BID  . polyethylene glycol  17 g Oral BID  . potassium chloride  40 mEq Oral Once  . traZODone  100 mg Oral QHS   Continuous Infusions:     LOS: 6 days    Time spent: 30 minutes    Arnie Maiolo, MD [facility] Pager [phone]  If 7PM-7AM, please contact night-coverage [email or web address] Password [facility] 03/27/2016, 6:25 PM

## 2016-03-27 NOTE — Care Management Important Message (Signed)
Important Message  Patient Details  Name: Vickie Murphy MRN: [id number] Date of Birth: 1933/04/08   Medicare Important Message Given:  Yes    Camillo Flaming 03/27/2016, 9:52 AMImportant Message  Patient Details  Name: Vickie Murphy MRN: [id number] Date of Birth: 08-Mar-1933   Medicare Important Message Given:  Yes    Camillo Flaming 03/27/2016, 9:52 AM

## 2016-03-28 ENCOUNTER — Inpatient Hospital Stay (HOSPITAL_COMMUNITY): Payer: Medicare Other

## 2016-03-28 DIAGNOSIS — R079 Chest pain, unspecified: Secondary | ICD-10-CM | POA: Diagnosis not present

## 2016-03-28 DIAGNOSIS — I214 Non-ST elevation (NSTEMI) myocardial infarction: Secondary | ICD-10-CM

## 2016-03-28 DIAGNOSIS — I209 Angina pectoris, unspecified: Secondary | ICD-10-CM

## 2016-03-28 DIAGNOSIS — K921 Melena: Principal | ICD-10-CM

## 2016-03-28 DIAGNOSIS — I5033 Acute on chronic diastolic (congestive) heart failure: Secondary | ICD-10-CM

## 2016-03-28 DIAGNOSIS — J9601 Acute respiratory failure with hypoxia: Secondary | ICD-10-CM

## 2016-03-28 DIAGNOSIS — R791 Abnormal coagulation profile: Secondary | ICD-10-CM

## 2016-03-28 DIAGNOSIS — R778 Other specified abnormalities of plasma proteins: Secondary | ICD-10-CM | POA: Diagnosis not present

## 2016-03-28 DIAGNOSIS — J189 Pneumonia, unspecified organism: Secondary | ICD-10-CM

## 2016-03-28 DIAGNOSIS — R7989 Other specified abnormal findings of blood chemistry: Secondary | ICD-10-CM

## 2016-03-28 LAB — TROPONIN I
TROPONIN I: 1.04 ng/mL — AB (ref <0.03)
Troponin I: 1.34 ng/mL (ref <0.03)
Troponin I: 1.27 ng/mL (ref <0.03)

## 2016-03-28 LAB — CBC WITH DIFFERENTIAL/PLATELET
BASOS PCT: 0 %
Basophils Absolute: 0 10*3/uL (ref 0.0–0.1)
EOS ABS: 0.2 10*3/uL (ref 0.0–0.7)
EOS PCT: 2 %
HCT: 34.4 % — ABNORMAL LOW (ref 36.0–46.0)
HEMOGLOBIN: 10.8 g/dL — AB (ref 12.0–15.0)
Lymphocytes Relative: 10 %
Lymphs Abs: 1.1 10*3/uL (ref 0.7–4.0)
MCH: 29.8 pg (ref 26.0–34.0)
MCHC: 31.4 g/dL (ref 30.0–36.0)
MCV: 94.8 fL (ref 78.0–100.0)
MONOS PCT: 8 %
Monocytes Absolute: 0.9 10*3/uL (ref 0.1–1.0)
NEUTROS PCT: 80 %
Neutro Abs: 8.7 10*3/uL — ABNORMAL HIGH (ref 1.7–7.7)
PLATELETS: 143 10*3/uL — AB (ref 150–400)
RBC: 3.63 MIL/uL — ABNORMAL LOW (ref 3.87–5.11)
RDW: 16.4 % — ABNORMAL HIGH (ref 11.5–15.5)
WBC: 10.9 10*3/uL — AB (ref 4.0–10.5)

## 2016-03-28 LAB — COMPREHENSIVE METABOLIC PANEL
ALBUMIN: 2.8 g/dL — AB (ref 3.5–5.0)
ALT: 301 U/L — AB (ref 14–54)
AST: 82 U/L — AB (ref 15–41)
Alkaline Phosphatase: 117 U/L (ref 38–126)
Anion gap: 7 (ref 5–15)
BUN: 17 mg/dL (ref 6–20)
CHLORIDE: 98 mmol/L — AB (ref 101–111)
CO2: 32 mmol/L (ref 22–32)
CREATININE: 0.95 mg/dL (ref 0.44–1.00)
Calcium: 8.5 mg/dL — ABNORMAL LOW (ref 8.9–10.3)
GFR calc non Af Amer: 55 mL/min — ABNORMAL LOW (ref >60)
GLUCOSE: 229 mg/dL — AB (ref 65–99)
Potassium: 4.1 mmol/L (ref 3.5–5.1)
SODIUM: 137 mmol/L (ref 135–145)
Total Bilirubin: 1.3 mg/dL — ABNORMAL HIGH (ref 0.3–1.2)
Total Protein: 7.3 g/dL (ref 6.5–8.1)

## 2016-03-28 LAB — GLUCOSE, CAPILLARY
GLUCOSE-CAPILLARY: 165 mg/dL — AB (ref 65–99)
Glucose-Capillary: 210 mg/dL — ABNORMAL HIGH (ref 65–99)

## 2016-03-28 LAB — BRAIN NATRIURETIC PEPTIDE: B Natriuretic Peptide: 940.4 pg/mL — ABNORMAL HIGH (ref 0.0–100.0)

## 2016-03-28 LAB — LACTIC ACID, PLASMA: Lactic Acid, Venous: 2.1 mmol/L (ref 0.5–1.9)

## 2016-03-28 LAB — D-DIMER, QUANTITATIVE: D-Dimer, Quant: 11.56 ug/mL-FEU — ABNORMAL HIGH (ref 0.00–0.50)

## 2016-03-28 MED ORDER — IOPAMIDOL (ISOVUE-370) INJECTION 76%
100.0000 mL | Freq: Once | INTRAVENOUS | Status: AC | PRN
  Administered 2016-03-28: 100 mL via INTRAVENOUS

## 2016-03-28 MED ORDER — NITROGLYCERIN 2 % TD OINT
0.5000 [in_us] | TOPICAL_OINTMENT | Freq: Four times a day (QID) | TRANSDERMAL | Status: DC
  Administered 2016-03-28: 0.5 [in_us] via TOPICAL
  Filled 2016-03-28: qty 30

## 2016-03-28 MED ORDER — MORPHINE SULFATE (PF) 2 MG/ML IV SOLN
2.0000 mg | INTRAVENOUS | Status: DC | PRN
  Administered 2016-03-28 – 2016-04-02 (×8): 2 mg via INTRAVENOUS
  Filled 2016-03-28 (×9): qty 1

## 2016-03-28 MED ORDER — FUROSEMIDE 10 MG/ML IJ SOLN
20.0000 mg | Freq: Two times a day (BID) | INTRAMUSCULAR | Status: DC
  Administered 2016-03-29 (×2): 20 mg via INTRAVENOUS
  Filled 2016-03-28 (×3): qty 2

## 2016-03-28 MED ORDER — GI COCKTAIL ~~LOC~~
30.0000 mL | Freq: Three times a day (TID) | ORAL | Status: DC | PRN
  Filled 2016-03-28: qty 30

## 2016-03-28 MED ORDER — MORPHINE SULFATE (PF) 2 MG/ML IV SOLN
2.0000 mg | INTRAVENOUS | Status: DC | PRN
  Administered 2016-03-28: 2 mg via INTRAVENOUS
  Filled 2016-03-28: qty 1

## 2016-03-28 MED ORDER — SODIUM CHLORIDE 0.9 % IV SOLN
8.0000 mg | Freq: Once | INTRAVENOUS | Status: AC
  Administered 2016-03-28: 8 mg via INTRAVENOUS
  Filled 2016-03-28: qty 4

## 2016-03-28 MED ORDER — ISOSORBIDE MONONITRATE ER 30 MG PO TB24
15.0000 mg | ORAL_TABLET | Freq: Every day | ORAL | Status: DC
  Administered 2016-03-28 – 2016-04-01 (×4): 15 mg via ORAL
  Filled 2016-03-28 (×4): qty 1

## 2016-03-28 NOTE — Progress Notes (Signed)
Nursing Note; Pt and belongings moved to room 1421.wbb

## 2016-03-28 NOTE — Progress Notes (Signed)
CRITICAL VALUE ALERT  Critical value received:  Troponin 1.29   Date of notification:  03/28/16  Time of notification:  1400  Critical value read back:Yes.    Nurse who received alert:  Marijean Montanye  MD notified (1st page):  Dr. Posey Pronto, md  Time of first page:  26  MD notified (2nd page):  Time of second page:  Responding MD:  .  Time MD responded:  .

## 2016-03-28 NOTE — Consult Note (Addendum)
CARDIOLOGY CONSULT NOTE   Patient ID: Vickie Murphy MRN: [id number], DOB/AGE: Oct 31, 1932   Admit date: 03/21/2016 Date of Consult: 03/28/2016  Primary Physician: Glendon Axe, MD Primary Cardiologist: New patient  Reason for consult:  Chest pain, nausea, troponin elevation  Problem List  Past Medical History:  Diagnosis Date  . Acid reflux   . Breast cancer ([place]) 2002   right  . Cancer [facility])    breast-s/p mastectomy and on chemo  . CHF (congestive heart failure) ([place])    pt reports hx of such  . Depression   . Diabetes mellitus without complication ([place])   . Hyperlipidemia   . Hypertension   . Recurrent UTI     Past Surgical History:  Procedure Laterality Date  . BREAST BIOPSY    . BREAST SURGERY Right   . CARDIAC VALVE REPLACEMENT     aortic, years ago-on no anticoagulation  . COLONOSCOPY N/A 03/24/2016   Procedure: COLONOSCOPY;  Surgeon: Carol Ada, MD;  Location: [facility];  Service: Endoscopy;  Laterality: N/A;  . ESOPHAGOGASTRODUODENOSCOPY N/A 03/22/2016   Procedure: ESOPHAGOGASTRODUODENOSCOPY (EGD);  Surgeon: Carol Ada, MD;  Location: [facility];  Service: Endoscopy;  Laterality: N/A;  . KIDNEY STONE SURGERY    . MASTECTOMY Right   . WISDOM TOOTH EXTRACTION       Allergies  No Known Allergies  HPI   This is a 82 year old female with history of diabetes, breast ca post right mastectomy on chemotherapy, dementia, status post right mastectomy for breast cancer, was admitted on 03/21/2016 with nausea from skilled nursing facility and was found to have significant GI bleed and initial hemoglobin of 5.1, and she underwent colonoscopy that showed multiple polyps in the ascending and descending colon that her resected and snared. She has received 3 units of blood transfusion with improvement of her hemoglobin currently 9.1 and stable. She was also diagnosed with bilateral pneumonia and she was treated with vancomycin Zosyn and Levaquin.Marland Kitchen She was transferred to  the oncology floor but because of complain off nausea and chest pain was transferred back to the medical floor.  Patient has been complaining of nausea throughout her hospital stay, she developed chest pain yesterday, she had another episode today. Currently she is complaining of nausea only. Her EKG shows sinus rhythm with possible prior anterior MI, however unchanged from prior. Her troponin is elevated at 1.2 --> 1.34 --> 1.04. She also has significantly elevated d-dimer at 11.6.  The patient is a poor historian she states she doesn't have any prior cardiac history, however has sternotomy scar in her chart states that she has a distant history of valve replacement. She doesn't remember her medical history or name of her cardiologist. She states that at her baseline she is minimally active, walks with walker, she lives in a skilled nursing facility.  Inpatient Medications  . aspirin EC  81 mg Oral Daily  . atorvastatin  10 mg Oral QPM  . diltiazem  120 mg Oral Daily  . FLUoxetine  60 mg Oral Daily  . insulin aspart  0-9 Units Subcutaneous TID WC  . insulin aspart  3 Units Subcutaneous TID WC  . insulin glargine  12 Units Subcutaneous QHS  . levofloxacin  500 mg Oral QPM  . metoprolol tartrate  25 mg Oral BID  . mirtazapine  15 mg Oral QHS  . nitroGLYCERIN  0.5 inch Topical Q6H  . ondansetron (ZOFRAN) IV  8 mg Intravenous Once  . pantoprazole  40 mg Oral BID  .  polyethylene glycol  17 g Oral BID  . potassium chloride  40 mEq Oral Once  . traZODone  100 mg Oral QHS   Family History Family History  Problem Relation Age of Onset  . Cancer Father   . Drug abuse Brother   . Breast cancer Neg Hx     Social History Social History   Social History  . Marital status: Divorced    Spouse name: N/A  . Number of children: N/A  . Years of education: N/A   Occupational History  . Not on file.   Social History Main Topics  . Smoking status: Never Smoker  . Smokeless tobacco: Never Used   . Alcohol use No  . Drug use: No  . Sexual activity: No   Other Topics Concern  . Not on file   Social History Narrative  . No narrative on file    Review of Systems  General:  No chills, fever, night sweats or weight changes.  Cardiovascular:  No chest pain, dyspnea on exertion, edema, orthopnea, palpitations, paroxysmal nocturnal dyspnea. Dermatological: No rash, lesions/masses Respiratory: No cough, dyspnea Urologic: No hematuria, dysuria Abdominal:   No nausea, vomiting, diarrhea, bright red blood per rectum, melena, or hematemesis Neurologic:  No visual changes, wkns, changes in mental status. All other systems reviewed and are otherwise negative except as noted above.  Physical Exam  Blood pressure 124/60, pulse 69, temperature 97.5 F (36.4 C), temperature source Oral, resp. rate 20, height 5\' 3"  (1.6 m), weight 141 lb (64 kg), SpO2 99 %.  General: Pleasant, In mild distress secondary to nausea Psych: Normal affect. Neuro: Alert and oriented X 2. Moves all extremities spontaneously. HEENT: Normal  Neck: Supple without bruits or JVD. Lungs:  Resp regular and unlabored, crackles at bases. Heart: RRR no s3, s4, 3/6 systolic murmur. Abdomen: Soft, non-tender, non-distended, BS + x 4.  Extremities: No clubbing, cyanosis or edema. DP/PT/Radials 2+ and equal bilaterally.  Labs  Recent Labs  03/27/16 1835 03/28/16 0059 03/28/16 0557 03/28/16 1307  TROPONINI 1.20* 1.34* 1.04* 1.27*   Lab Results  Component Value Date   WBC 10.9 (H) 03/28/2016   HGB 10.8 (L) 03/28/2016   HCT 34.4 (L) 03/28/2016   MCV 94.8 03/28/2016   PLT 143 (L) 03/28/2016    Recent Labs Lab 03/28/16 1307  NA 137  K 4.1  CL 98*  CO2 32  BUN 17  CREATININE 0.95  CALCIUM 8.5*  PROT 7.3  BILITOT 1.3*  ALKPHOS 117  ALT 301*  AST 82*  GLUCOSE 229*   No results found for: CHOL, HDL, LDLCALC, TRIG Lab Results  Component Value Date   DDIMER 11.56 (H) 03/28/2016    Radiology/Studies  Ct Chest Wo Contrast  Result Date: 03/22/2016 CLINICAL DATA:  Pneumonia  Increased nausea Chf, dementia Hx breast ca  IMPRESSION: 1. Findings are most consistent with bilateral pneumonia with patchy airspace consolidation and more diffuse hazy ground-glass opacification throughout the right lung, predominantly in the right upper lobe, and to a lesser degree in the left upper lobe and only mildly in the left lower lobe. There is a small right pleural effusion. Associated mediastinal and right hilar adenopathy is noted presumed reactive. The adenopathy is similar to the previous chest CT and may be chronic. The lung opacities could reflect asymmetric edema as opposed to pneumonia. Electronically Signed   By: Lajean Manes M.D.   On: 03/22/2016 15:42   Ct Abdomen Pelvis W Contrast  Result Date: 03/21/2016 CLINICAL  DATA:  Low hemoglobin and weakness with nausea and history of breast carcinoma IMPRESSION: Small right-sided pleural effusion. Diffuse fibrotic changes are noted in the bases with some mild superimposed ground-glass infiltrate particularly in the right lower lobe. Chronic changes as described above. Electronically Signed   By: Inez Catalina M.D.   On: 03/21/2016 10:19   Echocardiogram - 03/21/2016 Left ventricle: The cavity size was normal. There was mild   concentric hypertrophy. Systolic function was vigorous. The   estimated ejection fraction was in the range of 65% to 70%. Wall   motion was normal; there were no regional wall motion   abnormalities. The study is not technically sufficient to allow   evaluation of LV diastolic function. - Aortic valve: Valve mobility was restricted. There was moderate   stenosis. Peak velocity (S): 310 cm/s. Mean gradient (S): 21 mm   Hg. - Mitral valve: Severely calcified annulus. Mildly thickened,   mildly calcified leaflets . The findings are consistent with   moderate stenosis. There was moderate regurgitation. Mean    gradient (D): 9 mm Hg. - Left atrium: The atrium was severely dilated. Volume/bsa, S: 54.6   ml/m^2. - Right ventricle: The cavity size was mildly dilated. Wall   thickness was normal. - Right atrium: The atrium was severely dilated. - Tricuspid valve: There was moderate regurgitation. - Pulmonary arteries: Systolic pressure was moderately increased.   PA peak pressure: 51 mm Hg (S).  ECG: SR, anterior MI, age undetermined     ASSESSMENT AND PLAN  82 year old female  1. Troponin elevation, highly suspicious for non-STEMI, EKG is nonacute, troponin is now down trending, she was previously on subcutaneous heparin for DVT prophylaxis but it. Because of drop of her platelets.  She also has significant d-dimer elevation, I would schedule CT chest to rule out pulmonary embolism especially in the patient with cancer, and if positive for PE start on IV heparin. Her troponin is now down trending some if she had an MI is now completed and doesn't require IV heparin.  If her CT scan negative for pulmonary embolism, she is a poor candidate for cardiac catheterization considering underlying dementia, there are recent severe GI bleed and thrombocytopenia and overall poor functional status. I have personally reviewed her chest CT from 03/21/2016 and she has heavy calcifications of all 3 coronary arteries. I would continue aspirin, atorvastatin, metoprolol. I would start Imdur 30 mg by mouth daily. We will give morphine for pain.  2. Moderate aortic and mitral stenosis on echocardiogram, we will follow.  3. Moderate pulmonary hypertension - we we'll start low-dose indoors 15 mg daily.  4. Acute on chronic diastolic CHF - I would start Lasix 20 mg IV BID  This was a very complex consult requiring prolonged time with the patient and for decision making.  Signed, Ena Dawley, MD, [facility] 03/28/2016, 2:01 PM

## 2016-03-28 NOTE — Consult Note (Deleted)
Entered in error

## 2016-03-28 NOTE — Progress Notes (Signed)
Nursing Note: Report .called to Celeste,rn.Pt to be moved to room 1421.wbb

## 2016-03-28 NOTE — Evaluation (Addendum)
Clinical/Bedside Swallow Evaluation Patient Details  Name: Vickie Murphy MRN: [id number] Date of Birth: [birth year]  Today's Date: 03/28/2016 Time: SLP Start Time (ACUTE ONLY): 0941 SLP Stop Time (ACUTE ONLY): 1015 SLP Time Calculation (min) (ACUTE ONLY): 34 min  Past Medical History:  Past Medical History:  Diagnosis Date  . Acid reflux   . Breast cancer ([place]) 2002   right  . Cancer [facility])    breast-s/p mastectomy and on chemo  . CHF (congestive heart failure) ([place])    pt reports hx of such  . Depression   . Diabetes mellitus without complication ([place])   . Hyperlipidemia   . Hypertension   . Recurrent UTI    Past Surgical History:  Past Surgical History:  Procedure Laterality Date  . BREAST BIOPSY    . BREAST SURGERY Right   . CARDIAC VALVE REPLACEMENT     aortic, years ago-on no anticoagulation  . COLONOSCOPY N/A 03/24/2016   Procedure: COLONOSCOPY;  Surgeon: Carol Ada, MD;  [facility]: [facility];  Service: Endoscopy;  Laterality: N/A;  . ESOPHAGOGASTRODUODENOSCOPY N/A 03/22/2016   Procedure: ESOPHAGOGASTRODUODENOSCOPY (EGD);  Surgeon: Carol Ada, MD;  Location: [facility];  Service: Endoscopy;  Laterality: N/A;  . KIDNEY STONE SURGERY    . MASTECTOMY Right   . WISDOM TOOTH EXTRACTION     HPI:  82 yo female adm to [facility] from SNF with concerns for pneumonia and low hemoglobin.  PMH + for mild dementia, GERD, severe weakness walks with walker at baseline, DM type II now insulin-dependent, chronic congestive heart failure unknown type no echo in chart, history of breast cancer, depression, dyslipidemia who's been having issues with nausea for the last 3-4 months, underwent EGD at [facility] 2 weeks ago which according to family was completely unremarkable. She also has had multiple episodes of pneumonia requiring 3 courses of antibiotics in the last 1 month.  Swallow evaluation ordered and completed 8/15.  Pt is now s/p EGD with findings of normal esophagus, mild blood on UES,  vocal cords normal.         Assessment / Plan / Recommendation Clinical Impression  Pt today observed to chronically clear her throat upon SLP entrance to room.  She had just finished breakfast but denies awareness to throat clearing causing SLP to question if chronic issue.  Of note, pt was NOT clearing her throat during prior evaluation on 03/21/16.  ? if could be due to known reflux s/p meal.     Overt throat clearing and increased WOB noted after swallow via straw - much improved with small cup boluses.    Facial nerve deficits resulting in decreased lower facial movement on LEFT.   Pt uncertain if this CN deficit is new and there is no CVA hx in her chart.    Pt masticates on RIGHT side only with timely oral transit without residuals noted.      Given h/o GERD, CN deficits, symptoms of dysphagia, will follow up to assure tolerance.      Aspiration Risk  Mild aspiration risk    Diet Recommendation Thin liquid;Regular    Liquid Administration via: Cup;No straw Medication Administration: Whole meds with puree Supervision: Patient able to self feed Compensations: Slow rate;Small sips/bites    Other  Recommendations Oral Care Recommendations: Oral care BID   Follow up Recommendations       Frequency and Duration min 1 x/week  2 weeks       Prognosis Prognosis for Safe Diet Advancement: Good  Swallow Study   General Date of Onset: 03/21/16 HPI: 82 yo female adm to [facility] from SNF with concerns for pneumonia and low hemoglobin.  PMH + for mild dementia, GERD, severe weakness walks with walker at baseline, DM type II now insulin-dependent, chronic congestive heart failure unknown type no echo in chart, history of breast cancer, depression, dyslipidemia who's been having issues with nausea for the last 3-4 months, underwent EGD at [facility] 2 weeks ago which according to family was completely unremarkable. She also has had multiple episodes of pneumonia requiring 3 courses of  antibiotics in the last 1 month.  Swallow evaluation ordered and completed 8/15.  Pt is now s/p EGD with findings of normal esophagus, mild blood on UES, vocal cords normal.       Type of Study: Bedside Swallow Evaluation Diet Prior to this Study: Regular;Thin liquids Temperature Spikes Noted: No Respiratory Status: Nasal cannula Behavior/Cognition: Alert;Impulsive;Distractible Oral Care Completed by SLP: No Oral Cavity - Dentition: Adequate natural dentition Vision: Functional for self-feeding Self-Feeding Abilities: Able to feed self Patient Positioning: Upright in bed Baseline Vocal Quality: Low vocal intensity Volitional Cough: Weak Volitional Swallow: Unable to elicit    Oral/Motor/Sensory Function Overall Oral Motor/Sensory Function: Mild impairment Facial ROM: Reduced left Facial Symmetry: Abnormal symmetry left Facial Strength: Reduced left Facial Sensation: Other (Comment) (dnt) Lingual ROM: Other (Comment) (reduced) Lingual Symmetry:  (appears with bruising on left side of tongue without awareness) Velum: Other (comment) (sluggish but bilateral)   Ice Chips Ice chips: Not tested   Thin Liquid Thin Liquid: Impaired Presentation: Self Fed;Straw;Cup Pharyngeal  Phase Impairments: Throat Clearing - Immediate;Throat Clearing - Delayed;Multiple swallows Other Comments: immediate excessive throat clearing via straw- recommend no straw and small single boluses    Nectar Thick Nectar Thick Liquid: Not tested   Honey Thick Honey Thick Liquid: Not tested   Puree Puree: Not tested   Solid   GO   Other Comments: pt masticates on right side but no oral residuals       [name], [facility] SLP [phone]

## 2016-03-28 NOTE — Progress Notes (Signed)
Shift event note:  Notified by RN regarding first troponin result of 1.26. Pt had c/o intermittent CP today but is currently CP free. Initial EKG obtained at approx 1800 noted w/o ischemic changes as is a repeat EKG at approx 2130. Pt on Metoprolol 25 mg BID and is not a candidate for heparin given recent anemia (Hb 5.1 on admission) and persistent thrombocytopenia. Pt transferred to telemetry bed. Discussed pt w/ Dr Warren Lacy w/ cardiology service who, given clinical setting,  has no further recommendations at this time. May re-consult cardiology if pt has acute CP or there is a significant rise in troponin. Assessment/Plan: 1. Elevated troponin: Demand ischemia vs NSTEMI. Continue to cycle troponin's. Monitor closely on telemetry. Consider re-consulting cardiology in the event of acute CP and/or significant rise in troponin.   Vickie Columbia, NP-C [facility] Pager [phone]

## 2016-03-28 NOTE — Progress Notes (Signed)
Physical Therapy Treatment Patient Details Name: Vickie Murphy MRN: [id number] DOB: [birth year] Today's Date: 03/28/2016    History of Present Illness 82 y.o. female admitted from [facility] with abdominal pain and nausea. She has PMH of mild dementia, severe weakness walks with walker at baseline, GERD, DM type II now insulin-dependent, chronic congestive heart failure unknown type no echo in chart, history of breast cancer, depression, dyslipidemia. She's been having issues with nausea for the last 3-4 months, underwent EGD at [facility] 2 weeks ago which according to family was completely unremarkable. She also has had multiple episodes of pneumonia requiring 3 courses of antibiotics in the last 1 month.    PT Comments    Pt denies any pain.  Pt assisted over to drop arm recliner and requiring total assist for transfer.  Continue to recommend SNF upon d/c.  Follow Up Recommendations  SNF     Equipment Recommendations  None recommended by PT    Recommendations for Other Services       Precautions / Restrictions Precautions Precautions: Fall    Mobility  Bed Mobility Overal bed mobility: Needs Assistance Bed Mobility: Supine to Sit     Supine to sit: Mod assist;HOB elevated     General bed mobility comments: pt assisting with scooting feet over EOB, required more assist for upper body  Transfers Overall transfer level: Needs assistance Equipment used: None Transfers: Squat Pivot Transfers;Sit to/from Stand Sit to Stand: Total assist;+2 physical assistance   Squat pivot transfers: Total assist;+2 physical assistance     General transfer comment: pt unable to assist with sit to stand so performed squat pivot over to drop arm recliner   Ambulation/Gait                 Stairs            Wheelchair Mobility    Modified Rankin (Stroke Patients Only)       Balance                                    Cognition Arousal/Alertness:  Awake/alert Behavior During Therapy: WFL for tasks assessed/performed Overall Cognitive Status: No family/caregiver present to determine baseline cognitive functioning (able to follow simple commands)                      Exercises      General Comments        Pertinent Vitals/Pain Pain Assessment: No/denies pain    Home Living                      Prior Function            PT Goals (current goals can now be found in the care plan section) Progress towards PT goals: Progressing toward goals    Frequency  Min 3X/week    PT Plan Current plan remains appropriate    Co-evaluation             End of Session Equipment Utilized During Treatment: Gait belt;Oxygen Activity Tolerance: Patient limited by fatigue Patient left: in chair;with call bell/phone within reach;with chair alarm set     Time: [id number] PT Time Calculation (min) (ACUTE ONLY): 13 min  Charges:  $Therapeutic Activity: 8-22 mins                    G Codes:  [place],KATHrine E 03/28/2016, 12:40 PM Carmelia Bake, PT, DPT 03/28/2016 Pager: [phone]

## 2016-03-28 NOTE — Progress Notes (Signed)
Nursing Note: Orders received for EKG.Cardiology to be called and pt will likley be moved off the unit.wbb

## 2016-03-28 NOTE — Progress Notes (Signed)
[facility] Progress Note  Patient: Vickie Murphy [id number]   PCP: Glendon Axe, MD DOB: 08/22/1932   DOA: 03/21/2016   DOS: 03/28/2016   Date of Service: the patient was seen and examined on 03/28/2016  Subjective: Patient is complaining of central chest pain it's a constant pain and she mentions that this has started today but on further questioning she says that this has been there for last 2 days. Denies any nausea or vomiting. Has shortness of breath which is stable. Nutrition: Tolerating oral diet.  Brief hospital course: Pt. with PMH of dementia, GERD, DM, CHF, breast cancer, depression, dyslipidemia; admitted on 03/21/2016, with complaint of shortness of breath, was found to have pneumonia. Patient was treated with IV antibiotics and finished treatment course. Echogram was performed as differential was also including CHF and was given Lasix. During the course patient was found to be having acute blood loss anemia and is status post EGD and colonoscopy which were unremarkable for any acute bleeding. On 03/27/2016 patient started complaining of chest pain and had elevated troponin cardiology was consulted. Currently further plan is further workup of patient's chest pain with CT PE protocol and if positive will require heparin.  Assessment and Plan: 1. Acute hypoxemic respiratory failure (HCC) Bilateral community-acquired pneumonia. Patient presented with shortness of breath found to be having pneumonia and treated with IV vancomycin and Zosyn and later narrowed down to Levaquin. Finish total of 8 days of treatment. Blood cultures negative.  2. Chest pain. Elevated troponin. Appreciate cardiology in consultation. D-dimer elevated suggesting possible PE. CTP protocol ordered.Currently pending. If positive patient will require IV heparin. If negative per cardiology no IV heparin and required for demand ischemia at present. Continue aspirin, Lipitor, metoprolol. Cartilage  recommending to start Imdur. Use when necessary morphine for pain.  3. Acute blood loss anemia. Patient's initial presentation was due to abnormal hemoglobin of 5.1. Gastroenterology was consulted. Patient underwent EGD and colonoscopy which were unremarkable for any acute active bleeding. Currently her H&H is stable. We will continue to closely monitor. Status post PRBC transfusion.  4. Acute on chronic diastolic dysfunction. Patient also had shortness of breath on admission and echo program were showing normal ejection fraction with pulmonary hypertension. Patient was given low-dose Lasix. Cardiology is currently consulted. Patient started on IV Lasix. We'll continue to closely monitor.  Pain management: When necessary morphine Activity: Consulted physical therapy Bowel regimen: last BM 03/24/2016 Diet: Currently nothing by mouth DVT Prophylaxis: subcutaneous Heparin  Advance goals of care discussion: DNR/DNI  Family Communication: no family was present at bedside, at the time of interview.  Disposition:  Discharge to SNF. Expected discharge date: 03/30/2016, stabilization of cardiac symptoms  Consultants: Gastroenterology, cardiology Procedures: EGD, colonoscopy, echocardiogram  Antibiotics: Anti-infectives    Start     Dose/Rate Route Frequency Ordered Stop   03/28/16 1800  levofloxacin (LEVAQUIN) tablet 500 mg     500 mg Oral Every evening 03/27/16 1828 03/29/16 1759   03/24/16 1800  levofloxacin (LEVAQUIN) tablet 500 mg  Status:  Discontinued     500 mg Oral Every evening 03/24/16 1610 03/27/16 1828   03/22/16 1000  vancomycin (VANCOCIN) IVPB 750 mg/150 ml premix  Status:  Discontinued     750 mg 150 mL/hr over 60 Minutes Intravenous Every 24 hours 03/21/16 0929 03/24/16 1610   03/21/16 1600  piperacillin-tazobactam (ZOSYN) IVPB 3.375 g  Status:  Discontinued     3.375 g 12.5 mL/hr over 240 Minutes Intravenous Every 8 hours 03/21/16 0930 03/24/16  1610   03/21/16  0900  piperacillin-tazobactam (ZOSYN) IVPB 3.375 g     3.375 g 100 mL/hr over 30 Minutes Intravenous  Once 03/21/16 0846 03/21/16 1200   03/21/16 0900  vancomycin (VANCOCIN) IVPB 1000 mg/200 mL premix     1,000 mg 200 mL/hr over 60 Minutes Intravenous  Once 03/21/16 0847 03/21/16 1230        Intake/Output Summary (Last 24 hours) at 03/28/16 1801 Last data filed at 03/28/16 0600  Gross per 24 hour  Intake                0 ml  Output                0 ml  Net                0 ml   Filed Weights   03/25/16 0529 03/27/16 1208 03/28/16 0425  Weight: 63 kg (138 lb 12.8 oz) 64 kg (141 lb 1.5 oz) 64 kg (141 lb)    Objective: Physical Exam: Vitals:   03/28/16 0022 03/28/16 0023 03/28/16 0425 03/28/16 1423  BP: (!) 144/59  124/60 (!) 132/59  Pulse: 74  69 78  Resp: 20  20 18   Temp: 98.2 F (36.8 C)  97.5 F (36.4 C) 97.8 F (36.6 C)  TempSrc:   Oral Oral  SpO2:  94% 99% 100%  Weight:   64 kg (141 lb)   Height:        General: Alert, Awake and Oriented to Time, Place and Person. Appear in moderate distress Eyes: PERRL, Conjunctiva normal ENT: Oral Mucosa clear moist. Neck: difficult to assess JVD, no Abnormal Mass Or lumps Cardiovascular: S1 and S2 Present, no Murmur, Respiratory: Bilateral Air entry equal and Decreased,  BilateralCrackles, no wheezes Abdomen: Bowel Sound present, Soft and no tenderness Skin: no redness, no Rash  Extremities: bilateral Pedal edema, no calf tenderness Neurologic: Grossly no focal neuro deficit. Bilaterally Equal motor strength  Data Reviewed: CBC:  Recent Labs Lab 03/22/16 0535 03/23/16 0527 03/24/16 0432 03/25/16 0355 03/26/16 1143 03/28/16 1307  WBC 12.9* 9.7 9.0 6.5  --  10.9*  NEUTROABS  --   --   --   --   --  8.7*  HGB 9.2* 9.9* 9.9* 8.9* 9.1* 10.8*  HCT 28.0* 30.0* 30.9* 28.1* 29.1* 34.4*  MCV 89.5 90.4 91.4 92.7  --  94.8  PLT 129* 95* 75* 79*  --  [id number]*   Basic Metabolic Panel:  Recent Labs Lab 03/22/16 0535  03/23/16 0527 03/24/16 0432 03/25/16 0355 03/28/16 1307  NA 136 140 141 138 137  K 4.3 3.0* 3.1* 3.8 4.1  CL 105 104 105 103 98*  CO2 22 27 29 27  32  GLUCOSE 348* 100* 146* 190* 229*  BUN 62* 46* 27* 21* 17  CREATININE 1.30* 1.05* 0.97 0.91 0.95  CALCIUM 7.4* 7.8* 7.6* 7.2* 8.5*    Liver Function Tests:  Recent Labs Lab 03/28/16 1307  AST 82*  ALT 301*  ALKPHOS 117  BILITOT 1.3*  PROT 7.3  ALBUMIN 2.8*   No results for input(s): LIPASE, AMYLASE in the last 168 hours. No results for input(s): AMMONIA in the last 168 hours. Coagulation Profile:  Recent Labs Lab 03/22/16 0535  INR 2.19   Cardiac Enzymes:  Recent Labs Lab 03/27/16 1835 03/28/16 0059 03/28/16 0557 03/28/16 1307  TROPONINI 1.20* 1.34* 1.04* 1.27*   BNP (last 3 results) No results for input(s): PROBNP in the last 8760 hours.  CBG:  Recent Labs Lab 03/27/16 1622 03/27/16 1817 03/27/16 2332 03/28/16 0758 03/28/16 1120  GLUCAP 195* 226* 172* 165* 210*    Studies: No results found.   Scheduled Meds: . aspirin EC  81 mg Oral Daily  . atorvastatin  10 mg Oral QPM  . diltiazem  120 mg Oral Daily  . FLUoxetine  60 mg Oral Daily  . furosemide  20 mg Intravenous BID  . insulin aspart  0-9 Units Subcutaneous TID WC  . insulin aspart  3 Units Subcutaneous TID WC  . insulin glargine  12 Units Subcutaneous QHS  . isosorbide mononitrate  15 mg Oral Daily  . levofloxacin  500 mg Oral QPM  . metoprolol tartrate  25 mg Oral BID  . mirtazapine  15 mg Oral QHS  . pantoprazole  40 mg Oral BID  . polyethylene glycol  17 g Oral BID  . potassium chloride  40 mEq Oral Once  . traZODone  100 mg Oral QHS   Continuous Infusions:  PRN Meds: acetaminophen, albuterol, ALPRAZolam, diphenhydrAMINE, docusate sodium, gi cocktail, hydrALAZINE, HYDROcodone-acetaminophen, lip balm, morphine injection, ondansetron (ZOFRAN) IV, promethazine  Time spent: 30 minutes  Author: Berle Mull, MD [facility] Pager: [phone] 03/28/2016 6:01 PM  If 7PM-7AM, please contact night-coverage at [email or web address], password [facility]

## 2016-03-28 NOTE — Progress Notes (Signed)
Nursing Note: Paged on-call to address troponin of 1.2.Pt resting quietly in bed and denies pain.wbb

## 2016-03-28 NOTE — Progress Notes (Signed)
CRITICAL VALUE ALERT  Critical value received:  Lactic acid 2.1  Date of notification:  03/28/16  Time of notification:  1400  Critical value read back:Yes.    Nurse who received alert:  Anissia Wessells  MD notified (1st page):  Dr. Posey Pronto, md  Time of first page:  45  MD notified (2nd page):  Time of second page:  Responding MD:    Time MD responded:

## 2016-03-28 NOTE — Progress Notes (Signed)
Patient complains of Chest Pain. EKG done and labs drawn Troponin was 1.20. MD notified

## 2016-03-29 DIAGNOSIS — R7989 Other specified abnormal findings of blood chemistry: Secondary | ICD-10-CM

## 2016-03-29 DIAGNOSIS — D696 Thrombocytopenia, unspecified: Secondary | ICD-10-CM

## 2016-03-29 DIAGNOSIS — I10 Essential (primary) hypertension: Secondary | ICD-10-CM

## 2016-03-29 DIAGNOSIS — E119 Type 2 diabetes mellitus without complications: Secondary | ICD-10-CM

## 2016-03-29 DIAGNOSIS — R740 Nonspecific elevation of levels of transaminase and lactic acid dehydrogenase [LDH]: Secondary | ICD-10-CM

## 2016-03-29 DIAGNOSIS — E785 Hyperlipidemia, unspecified: Secondary | ICD-10-CM

## 2016-03-29 DIAGNOSIS — I248 Other forms of acute ischemic heart disease: Secondary | ICD-10-CM | POA: Diagnosis present

## 2016-03-29 LAB — COMPREHENSIVE METABOLIC PANEL
ALBUMIN: 2.5 g/dL — AB (ref 3.5–5.0)
ALT: 235 U/L — ABNORMAL HIGH (ref 14–54)
ANION GAP: 10 (ref 5–15)
AST: 57 U/L — ABNORMAL HIGH (ref 15–41)
Alkaline Phosphatase: 103 U/L (ref 38–126)
BILIRUBIN TOTAL: 1.4 mg/dL — AB (ref 0.3–1.2)
BUN: 15 mg/dL (ref 6–20)
CO2: 31 mmol/L (ref 22–32)
Calcium: 8.5 mg/dL — ABNORMAL LOW (ref 8.9–10.3)
Chloride: 94 mmol/L — ABNORMAL LOW (ref 101–111)
Creatinine, Ser: 0.65 mg/dL (ref 0.44–1.00)
GFR calc Af Amer: >60 mL/min (ref >60)
Glucose, Bld: 191 mg/dL — ABNORMAL HIGH (ref 65–99)
POTASSIUM: 3.4 mmol/L — AB (ref 3.5–5.1)
Sodium: 135 mmol/L (ref 135–145)
TOTAL PROTEIN: 6.6 g/dL (ref 6.5–8.1)

## 2016-03-29 LAB — CBC WITH DIFFERENTIAL/PLATELET
BASOS ABS: 0 10*3/uL (ref 0.0–0.1)
Basophils Relative: 0 %
EOS ABS: 0.5 10*3/uL (ref 0.0–0.7)
Eosinophils Relative: 3 %
HCT: 35.6 % — ABNORMAL LOW (ref 36.0–46.0)
HEMOGLOBIN: 11.4 g/dL — AB (ref 12.0–15.0)
LYMPHS ABS: 0.5 10*3/uL — AB (ref 0.7–4.0)
LYMPHS PCT: 3 %
MCH: 29.6 pg (ref 26.0–34.0)
MCHC: 32 g/dL (ref 30.0–36.0)
MCV: 92.5 fL (ref 78.0–100.0)
Monocytes Absolute: 1.1 10*3/uL — ABNORMAL HIGH (ref 0.1–1.0)
Monocytes Relative: 7 %
NEUTROS ABS: 13.8 10*3/uL — AB (ref 1.7–7.7)
Neutrophils Relative %: 87 %
PLATELETS: 76 10*3/uL — AB (ref 150–400)
RBC: 3.85 MIL/uL — AB (ref 3.87–5.11)
RDW: 16.4 % — AB (ref 11.5–15.5)
WBC: 15.9 10*3/uL — AB (ref 4.0–10.5)

## 2016-03-29 LAB — GLUCOSE, CAPILLARY
GLUCOSE-CAPILLARY: 187 mg/dL — AB (ref 65–99)
GLUCOSE-CAPILLARY: 120 mg/dL — AB (ref 65–99)
Glucose-Capillary: 143 mg/dL — ABNORMAL HIGH (ref 65–99)
Glucose-Capillary: 86 mg/dL (ref 65–99)

## 2016-03-29 LAB — PROTIME-INR
INR: 1.24
Prothrombin Time: 15.7 seconds — ABNORMAL HIGH (ref 11.4–15.2)

## 2016-03-29 LAB — MAGNESIUM: MAGNESIUM: 1.6 mg/dL — AB (ref 1.7–2.4)

## 2016-03-29 MED ORDER — MAGNESIUM SULFATE 2 GM/50ML IV SOLN
2.0000 g | Freq: Once | INTRAVENOUS | Status: AC
  Administered 2016-03-29: 2 g via INTRAVENOUS
  Filled 2016-03-29: qty 50

## 2016-03-29 MED ORDER — POTASSIUM CHLORIDE 10 MEQ/100ML IV SOLN
10.0000 meq | INTRAVENOUS | Status: AC
  Administered 2016-03-29 (×4): 10 meq via INTRAVENOUS
  Filled 2016-03-29 (×4): qty 100

## 2016-03-29 NOTE — Progress Notes (Signed)
SLP Cancellation Note  Patient Details Name: Vickie Murphy MRN: [id number] DOB: February 16, 1933   Cancelled treatment:       Reason Eval/Treat Not Completed: Fatigue/lethargy limiting ability to participate   Claudie Fisherman, [place]ana Locey [facility] SLP [phone]

## 2016-03-29 NOTE — Progress Notes (Signed)
Hospital Problem List     Principal Problem:   Acute hypoxemic respiratory failure (HCC) Active Problems:   Diabetes mellitus without complication (HCC)   Hypertension   Hyperlipidemia   CAP (community acquired pneumonia)   Elevated troponin   Chest pain   Diastolic dysfunction with acute on chronic heart failure [facility])    Patient Profile:   Primary Cardiologist: New - Dr. Meda Coffee  82 yo female w/ PMH of breast cancer (s/p R mastectomy), HTN, HLD, Type 2 DM, and dementia who presented to [facility] ED on 8/15 for dyspnea. Diagnosed with GI bleed and PNA. Cards consulted on 8/22 for elevated troponin.   Subjective   Denies any episodes of chest pain. Lunch tray is in front of her, untouched. Says she is not hungry. Denies any nausea or vomiting.   Inpatient Medications    . aspirin EC  81 mg Oral Daily  . atorvastatin  10 mg Oral QPM  . diltiazem  120 mg Oral Daily  . FLUoxetine  60 mg Oral Daily  . furosemide  20 mg Intravenous BID  . insulin aspart  0-9 Units Subcutaneous TID WC  . insulin aspart  3 Units Subcutaneous TID WC  . insulin glargine  12 Units Subcutaneous QHS  . isosorbide mononitrate  15 mg Oral Daily  . metoprolol tartrate  25 mg Oral BID  . mirtazapine  15 mg Oral QHS  . pantoprazole  40 mg Oral BID  . polyethylene glycol  17 g Oral BID  . traZODone  100 mg Oral QHS    Vital Signs    Vitals:   03/28/16 2059 03/29/16 0457 03/29/16 0500 03/29/16 1314  BP: 132/73 135/62  (!) 105/49  Pulse: 82 79  76  Resp: 20   18  Temp: 98.2 F (36.8 C) 98.1 F (36.7 C)  97.9 F (36.6 C)  TempSrc: Oral Oral  Oral  SpO2: 95% 93%  99%  Weight:   139 lb 3.2 oz (63.1 kg)   Height:        Intake/Output Summary (Last 24 hours) at 03/29/16 1416 Last data filed at 03/29/16 1315  Gross per 24 hour  Intake               10 ml  Output                0 ml  Net               10 ml   Filed Weights   03/27/16 1208 03/28/16 0425 03/29/16 0500  Weight: 141 lb 1.5 oz (64  kg) 141 lb (64 kg) 139 lb 3.2 oz (63.1 kg)    Physical Exam    General: Elderly Caucasian female appearing in no acute distress. Head: Normocephalic, atraumatic.  Neck: Supple without bruits, JVD not elevated. Lungs:  Resp regular and unlabored, rales at bases bilaterally. Heart: RRR, S1, S2, no S3, S4, 3/6 SEM at RUSB, no rub. Abdomen: Soft, non-tender, non-distended with normoactive bowel sounds. No hepatomegaly. No rebound/guarding. No obvious abdominal masses. Extremities: No clubbing, cyanosis, or edema. Distal pedal pulses are 2+ bilaterally. Neuro: Alert and oriented X 2 (person, place). Moves all extremities spontaneously. Psych: Normal affect.  Labs    CBC  Recent Labs  03/28/16 1307 03/29/16 0550  WBC 10.9* 15.9*  NEUTROABS 8.7* 13.8*  HGB 10.8* 11.4*  HCT 34.4* 35.6*  MCV 94.8 92.5  PLT 143* 76*   Basic Metabolic Panel  Recent Labs  03/28/16  1307 03/29/16 0550  NA 137 135  K 4.1 3.4*  CL 98* 94*  CO2 32 31  GLUCOSE 229* 191*  BUN 17 15  CREATININE 0.95 0.65  CALCIUM 8.5* 8.5*  MG  --  1.6*   Liver Function Tests  Recent Labs  03/28/16 1307 03/29/16 0550  AST 82* 57*  ALT 301* 235*  ALKPHOS 117 103  BILITOT 1.3* 1.4*  PROT 7.3 6.6  ALBUMIN 2.8* 2.5*   No results for input(s): LIPASE, AMYLASE in the last 72 hours. Cardiac Enzymes  Recent Labs  03/28/16 0059 03/28/16 0557 03/28/16 1307  TROPONINI 1.34* 1.04* 1.27*   BNP Invalid input(s): POCBNP D-Dimer  Recent Labs  03/28/16 1307  DDIMER 11.56*     Telemetry    NSR, HR in 70's - 80's. Multiform PVC's.   ECG    No new tracings.    Cardiac Studies and Radiology    Dg Chest 1 View  Result Date: 03/27/2016 CLINICAL DATA:  History of breast carcinoma. Areas of lung infiltrate EXAM: CHEST 1 VIEW COMPARISON:  Chest radiograph March 21, 2016 and chest CT March 22, 2016 FINDINGS: There is persistent interstitial and patchy alveolar opacity bilaterally. Heart is borderline  enlarged with the pulmonary vascularity within normal limits. There is aortic atherosclerosis. There is calcification in the mitral annulus. Patient is status post aortic valve replacement. Adenopathy is better seen on recent chest CT than current radiographic examination. IMPRESSION: Stable appearance of the lungs. Suspect a combination of interstitial and alveolar edema with likely superimposed pneumonia. Stable cardiac silhouette. Adenopathy is better seen on CT than current radiographic examination. Electronically Signed   By: Lowella Grip III M.D.   On: 03/27/2016 12:51   Dg Abd 1 View  Result Date: 03/23/2016 CLINICAL DATA:  Nasogastric tube placement EXAM: ABDOMEN - 1 VIEW COMPARISON:  Portable exam 2112 hours compared to 03/21/2016 CT abdomen and pelvis FINDINGS: Nasogastric tube traverses stomach with tip projecting over the proximal third portion of the duodenum. Bowel gas pattern normal. Degenerative disc disease and scoliosis of the thoracolumbar spine with osseous demineralization. No urinary tract calcification. IMPRESSION: Tip of nasogastric tube traverses stomach and projects over expected position of the third portion of the duodenum ; consider withdrawal 14 cm to place tip at the expected position of the gastric antrum. Findings called to [place] on [facility] on 03/23/2016 at 2156 hours. Electronically Signed   By: Lavonia Dana M.D.   On: 03/23/2016 21:57   Ct Chest Wo Contrast  Result Date: 03/22/2016 CLINICAL DATA:  Pneumonia  Increased nausea Chf, dementia Hx breast ca EXAM: CT CHEST WITHOUT CONTRAST TECHNIQUE: Multidetector CT imaging of the chest was performed following the standard protocol without IV contrast. COMPARISON:  Chest radiograph, 03/21/2016.  Chest CT, 11/10/2015. FINDINGS: Neck base and axilla: There several prominent left neck base lymph nodes, largest measuring 9 mm in short axis. No pathologically enlarged lymph nodes in the neck base or in either axilla. No masses.  Postsurgical changes are noted along the right axilla extending along the right pectoralis from previous right breast surgery. Cardiovascular: Heart is top-normal in size. There is dense calcification of the mitral valve annulus. There are dense coronary artery calcifications. Calcified plaque is noted along the aortic arch patch that calcified plaque is noted along the thoracic aorta. Calcified plaque extends into the origin of the innominate artery and left subclavian artery most significantly the left subclavian artery. Mediastinum/Nodes: There is mediastinal and right hilar adenopathy. A prevascular node  near the AP window measures 13 mm in short axis. An upper first anterior mediastinal node measures 9 mm short axis. An azygos level right peritracheal to precarinal node measures 14 mm in short axis. Soft tissue surrounding the right hilum is presumed to be adenopathy, with discrete nodes not well-defined. The areas in the inferior right subcarinal right hilar node measuring 17 mm in short axis. Lungs/Pleura: Patchy confluent and ground-glass type airspace lung opacities seen throughout the right lung predominantly in the right upper lobe. There is a geographic appearing ground-glass opacity are noted in the left upper lobe and, to lesser degree, left lower lobe. This is superimposed on changes of interstitial fibrosis, more evident on the right, which are stable from the prior study. There is a small right pleural effusion. No pneumothorax. Upper Abdomen: No acute findings.  No liver or adrenal masses. Musculoskeletal: Mild degenerative changes of the thoracic spine. No osteoblastic or osteolytic lesions. IMPRESSION: 1. Findings are most consistent with bilateral pneumonia with patchy airspace consolidation and more diffuse hazy ground-glass opacification throughout the right lung, predominantly in the right upper lobe, and to a lesser degree in the left upper lobe and only mildly in the left lower lobe. There  is a small right pleural effusion. Associated mediastinal and right hilar adenopathy is noted presumed reactive. The adenopathy is similar to the previous chest CT and may be chronic. The lung opacities could reflect asymmetric edema as opposed to pneumonia. Electronically Signed   By: Lajean Manes M.D.   On: 03/22/2016 15:42   Ct Angio Chest Pe W Or Wo Contrast  Result Date: 03/28/2016 CLINICAL DATA:  Chest pain, recent pneumonia, history of breast cancer, CHF EXAM: CT ANGIOGRAPHY CHEST WITH CONTRAST TECHNIQUE: Multidetector CT imaging of the chest was performed using the standard protocol during bolus administration of intravenous contrast. Multiplanar CT image reconstructions and MIPs were obtained to evaluate the vascular anatomy. CONTRAST:  100 mL Isovue 370 IV COMPARISON:  CT chest dated 03/22/2016 and 11/10/2015. FINDINGS: Cardiovascular: Satisfactory opacification of the pulmonary arteries to the segmental level. No evidence of pulmonary embolism. Prosthetic aortic valve. Atherosclerotic calcifications of the aortic arch. Mitral valve annular calcifications. Cardiomegaly.  No pericardial effusion. Three vessel coronary atherosclerosis. Mediastinum/Nodes: Chronic thoracic lymphadenopathy, including a dominant 1.7 cm short axis low right paratracheal node (series 4/ image 32) and a 1.6 cm right hilar node (series 4/ image 34), similar to 11/10/2015. Postsurgical changes in the left axilla. Visualized thyroid is unremarkable. Lungs/Pleura: Chronic interstitial lung disease with multifocal areas of bronchiectasis and scattered fibrosis, right upper lobe predominant, progressed since 11/10/2015. Superimposed right upper lobe pneumonia and/or mild interstitial edema is possible. Small right pleural effusion, increased. Trace left pleural effusion, new. No pneumothorax. Upper Abdomen: Visualized upper abdomen is notable for trace perihepatic ascites. Musculoskeletal: Postsurgical changes related to prior right  mastectomy. Degenerative changes of the visualized thoracolumbar spine. Median sternotomy. Review of the MIP images confirms the above findings. IMPRESSION: No evidence of pulmonary embolism. Chronic interstitial lung disease, right upper lobe predominant, progressed since 11/10/2015. Superimposed right upper lobe pneumonia and/or interstitial edema is possible. Small right pleural effusion, increased. Trace left pleural effusion, new. Status post right mastectomy and right axillary lymph node dissection. No findings specific for metastatic disease. Chronic thoracic lymphadenopathy, as described above, possibly reactive. Electronically Signed   By: Julian Hy M.D.   On: 03/28/2016 18:56   Ct Abdomen Pelvis W Contrast  Result Date: 03/21/2016 CLINICAL DATA:  Low hemoglobin and weakness with  nausea and history of breast carcinoma EXAM: CT ABDOMEN AND PELVIS WITH CONTRAST TECHNIQUE: Multidetector CT imaging of the abdomen and pelvis was performed using the standard protocol following bolus administration of intravenous contrast. CONTRAST:  163mL ISOVUE-300 IOPAMIDOL (ISOVUE-300) INJECTION 61% COMPARISON:  None. FINDINGS: Lower chest: Small right-sided pleural effusion is noted. Diffuse fibrotic changes are noted in the bases bilaterally. Some superimposed ground-glass changes are noted which may represent acute infiltrate particularly in the right lower lobe. Coronary calcifications are again noted. Hepatobiliary: No masses or other significant abnormality. Pancreas: No mass, inflammatory changes, or other significant abnormality. Spleen: Within normal limits in size and appearance. Adrenals/Urinary Tract: No masses identified. No evidence of hydronephrosis. Stomach/Bowel: The appendix is not well visualized. Diverticular changes noted without evidence of diverticulitis. Mild fecal material throughout the colon is noted consistent with mild constipation. No obstructive changes are seen. Vascular/Lymphatic:  Aortoiliac calcifications are noted without aneurysmal dilatation. No significant lymphadenopathy is noted. Reproductive: No acute abnormality noted. Other: No free fluid is noted. Musculoskeletal: Degenerative changes of the lumbar spine are noted. Mild scoliosis is seen. IMPRESSION: Small right-sided pleural effusion. Diffuse fibrotic changes are noted in the bases with some mild superimposed ground-glass infiltrate particularly in the right lower lobe. Chronic changes as described above. Electronically Signed   By: Inez Catalina M.D.   On: 03/21/2016 10:19   Dg Chest Port 1 View  Result Date: 03/21/2016 CLINICAL DATA:  Shortness of breath.  History of breast carcinoma EXAM: PORTABLE CHEST 1 VIEW COMPARISON:  February 16, 2016 chest radiograph and chest CT November 10, 2015 FINDINGS: There is widespread interstitial edema with patchy alveolar opacity throughout the left mid lung region as well as much the right lung. Heart is borderline enlarged with pulmonary venous hypertension. There is calcification of the mitral annulus. Patient is status post median sternotomy. Adenopathy noted on previous chest CT is less well appreciated by radiography. No bone lesions are evident. Patient is status post aortic valve replacement. Atherosclerotic calcification is noted in the aorta. IMPRESSION: Findings felt to be most consistent with a degree of congestive heart failure, apparently superimposed on chronic interstitial disease. A degree of superimposed pneumonia cannot be excluded radiographically. More than one of these entities may exist concurrently. Stable cardiac silhouette. Aortic atherosclerosis. Electronically Signed   By: Lowella Grip III M.D.   On: 03/21/2016 07:10    Echocardiogram: 03/21/2016 Study Conclusions  - Procedure narrative: Transthoracic echocardiography. Image   quality was adequate. The study was technically difficult. - Left ventricle: The cavity size was normal. There was mild   concentric  hypertrophy. Systolic function was vigorous. The   estimated ejection fraction was in the range of 65% to 70%. Wall   motion was normal; there were no regional wall motion   abnormalities. The study is not technically sufficient to allow   evaluation of LV diastolic function. - Aortic valve: Valve mobility was restricted. There was moderate   stenosis. Peak velocity (S): 310 cm/s. Mean gradient (S): 21 mm   Hg. - Mitral valve: Severely calcified annulus. Mildly thickened,   mildly calcified leaflets . The findings are consistent with   moderate stenosis. There was moderate regurgitation. Mean   gradient (D): 9 mm Hg. - Left atrium: The atrium was severely dilated. Volume/bsa, S: 54.6   ml/m^2. - Right ventricle: The cavity size was mildly dilated. Wall   thickness was normal. - Right atrium: The atrium was severely dilated. - Tricuspid valve: There was moderate regurgitation. - Pulmonary arteries:  Systolic pressure was moderately increased.   PA peak pressure: 51 mm Hg (S).  Assessment & Plan    1. Elevated Troponin - cyclic values have been 1.20, 1.34, 1.04, and 1.27, suspicious for non-STEMI, EKG is non-acute. Denies any recent chest discomfort but history limited secondary to dementia.  - d-dimer elevated to 11.56 but CTA negative for PE.  - Chest CT from 03/21/2016 was reviewed by Dr. Meda Coffee and showed heavy calcifications of all 3 coronary arteries. Poor candidate for cardiac catheterization with dementia, recent severe GI bleed and thrombocytopenia.  - continue ASA, atorvastatin, metoprolol, and recently started Imdur 15mg  daily.   2. Moderate aortic and mitral stenosis  - noted on echocardiogram this admission - continued follow-up as an outpatient but doubtful she would be a surgical candidate with her dementia.   3. Moderate pulmonary hypertension  - PA Peak pressure elevated to 51 mm Hg this admission. Started on low-dose Imdur. Would titrate up to 30mg  daily prior to  discharge if BP will allow.  4. Acute on chronic diastolic CHF - echocardiogram on 8/15 showed preserved EF of 65-70%.  - started on IV Lasix 20 mg BID. I&O's not recorded but weight down 2 lbs. Continue for additional day.   5. Thrombocytopenia - 247K on admission, at [place] today. - per admitting team  Signed, Erma Heritage , PA-C 2:16 PM 03/29/2016 Pager: [phone]   The patient was seen, examined and discussed with Bernerd Pho, PA-C and I agree with the above.    82 year old female with breast cancer, dementia, GIB, now new thrombocytopenia who developed troponin elevation, now downtrending. Chest pain free.  EKG is nonacute, troponin is now down trending, she was previously on subcutaneous heparin for DVT prophylaxis but it. Because of drop of her platelets. She also has significant d-dimer elevation, CT negative for pulmonary embolism. Overall, she is a poor candidate for cardiac catheterization considering underlying dementia, there are recent severe GI bleed and thrombocytopenia and overall poor functional status. I would continue aspirin, atorvastatin, metoprolol imdur 30 mg by mouth daily. morphine for pain.  We will sign off, call us with any questions.   Ena Dawley 03/29/2016

## 2016-03-29 NOTE — Progress Notes (Signed)
PROGRESS NOTE                                                                                                                                                                                                             Patient Demographics:    Vickie Murphy, is a 82 y.o. female, DOB - 1932/12/25, [id number]  Admit date - 03/21/2016   Admitting Physician Thurnell Lose, MD  Outpatient Primary MD for the patient is Glendon Axe, MD  LOS - 8  Outpatient Specialists: none  Chief Complaint  Patient presents with  . Abnormal Lab       Brief Narrative   82 year old female with dementia, CHF, diabetes mellitus, history of breast cancer, depression, dyslipidemia admitted with shortness of breath and found to have pneumonia. Patient treated with IV antibiotic. 2-D echo done was suggestive of CHF. During hospital course patient was found to have acute blood loss anemia and underwent EGD and colonoscopy without findings of active source of bleeding. On 8/21 patient complained of chest pain and was found to have elevated troponin. As it CT angiogram of the chest was done which was negative for PE. Cardiology consulted for further management.   Subjective:   Patient denied any specific symptoms. Extremely poor historian.   Assessment  & Plan :    Principal Problem:   Acute hypoxemic respiratory failure (HCC) Suspected due to multilobar pneumonia. Has completed course of antibiotics. Blood cultures negative.  Active Problems: NSTEMI Troponin peaked to 1.34. No further chest symptoms. CT angiogram of the chest negative for PE but showing heavy calcification of all 3 coronaries. Cardiovascular demonstrates a poor candidate for cardiac catheterization given her dementia and recent severe blood loss anemia. Continue aspirin, beta blocker. Added Imdur. Hold statin given transaminitis.  Acute on chronic diastolic CHF EF of [id number].  Started on IV Lasix 20 mg twice a day. Monitor strict I/O  Acute blood loss anemia.  Hemoglobin as low as 5.1. Underwent EGD and colonoscopy without findings of acute source of bleeding. H&H now stable posttransfusion..  Thrombocytopenia Noted for significant drop in platelets this morning. Monitor closely. Discontinue heparin. I'll.  diabetes mellitus without complication (HCC)  sliding-scale coverage   Hypokalemia Replenish  Acute transaminitis New  finding. Check hepatitis panel and liver ultrasound.     Code Status  DO NOT RESUSCITATE  Family Communication None at bedside  Disposition Plan  :Skilled nursing facility possibly tomorrow if continues to improves Barrier  For Discharge :  improving symptoms   Consultcardiology  Procedures  :   2-D echo CT angiogram of the chest  DVT Prophylaxis  :   SCDs   Lab Results  Component Value Date   PLT 76 (L) 03/29/2016    Antibiotics  :    Anti-infectives    Start     Dose/Rate Route Frequency Ordered Stop   03/28/16 1800  levofloxacin (LEVAQUIN) tablet 500 mg     500 mg Oral Every evening 03/27/16 1828 03/28/16 1854   03/24/16 1800  levofloxacin (LEVAQUIN) tablet 500 mg  Status:  Discontinued     500 mg Oral Every evening 03/24/16 1610 03/27/16 1828   03/22/16 1000  vancomycin (VANCOCIN) IVPB 750 mg/150 ml premix  Status:  Discontinued     750 mg 150 mL/hr over 60 Minutes Intravenous Every 24 hours 03/21/16 0929 03/24/16 1610   03/21/16 1600  piperacillin-tazobactam (ZOSYN) IVPB 3.375 g  Status:  Discontinued     3.375 g 12.5 mL/hr over 240 Minutes Intravenous Every 8 hours 03/21/16 0930 03/24/16 1610   03/21/16 0900  piperacillin-tazobactam (ZOSYN) IVPB 3.375 g     3.375 g 100 mL/hr over 30 Minutes Intravenous  Once 03/21/16 0846 03/21/16 1200   03/21/16 0900  vancomycin (VANCOCIN) IVPB 1000 mg/200 mL premix     1,000 mg 200 mL/hr over 60 Minutes Intravenous  Once 03/21/16 0847 03/21/16 1230         Objective:   Vitals:   03/28/16 2059 03/29/16 0457 03/29/16 0500 03/29/16 1314  BP: 132/73 135/62  (!) 105/49  Pulse: 82 79  76  Resp: 20   18  Temp: 98.2 F (36.8 C) 98.1 F (36.7 C)  97.9 F (36.6 C)  TempSrc: Oral Oral  Oral  SpO2: 95% 93%  99%  Weight:   63.1 kg (139 lb 3.2 oz)   Height:        Wt Readings from Last 3 Encounters:  03/29/16 63.1 kg (139 lb 3.2 oz)  03/15/16 59.6 kg (131 lb 6.4 oz)  02/23/16 62.9 kg (138 lb 9.6 oz)     Intake/Output Summary (Last 24 hours) at 03/29/16 1750 Last data filed at 03/29/16 1315  Gross per 24 hour  Intake               10 ml  Output                0 ml  Net               10 ml     Physical Exam  Gen: not in distress HEENTPallor present, moist mucosa, supple neck Chest: clear b/l, no added sounds CVS: N S1&S2, no murmurs,  GI: soft, NT, ND, BS+ Musculoskeletal: warm, no edema CNS: AAOX1-2, non focal    Data Review:    CBC  Recent Labs Lab 03/23/16 0527 03/24/16 0432 03/25/16 0355 03/26/16 1143 03/28/16 1307 03/29/16 0550  WBC 9.7 9.0 6.5  --  10.9* 15.9*  HGB 9.9* 9.9* 8.9* 9.1* 10.8* 11.4*  HCT 30.0* 30.9* 28.1* 29.1* 34.4* 35.6*  PLT 95* 75* 79*  --  143* 76*  MCV 90.4 91.4 92.7  --  94.8 92.5  MCH 29.8 29.3 29.4  --  29.8 29.6  MCHC 33.0 32.0 31.7  --  31.4 32.0  RDW 16.8* 16.6* 16.8*  --  16.4* 16.4*  LYMPHSABS  --   --   --   --  1.1 0.5*  MONOABS  --   --   --   --  0.9 1.1*  EOSABS  --   --   --   --  0.2 0.5  BASOSABS  --   --   --   --  0.0 0.0    Chemistries   Recent Labs Lab 03/23/16 0527 03/24/16 0432 03/25/16 0355 03/28/16 1307 03/29/16 0550  NA 140 141 138 137 135  K 3.0* 3.1* 3.8 4.1 3.4*  CL 104 105 103 98* 94*  CO2 27 29 27  32 31  GLUCOSE 100* 146* 190* 229* 191*  BUN 46* 27* 21* 17 15  CREATININE 1.05* 0.97 0.91 0.95 0.65  CALCIUM 7.8* 7.6* 7.2* 8.5* 8.5*  MG  --   --   --   --  1.6*  AST  --   --   --  82* 57*  ALT  --   --   --  301* 235*  ALKPHOS  --   --   --   117 103  BILITOT  --   --   --  1.3* 1.4*   ------------------------------------------------------------------------------------------------------------------ No results for input(s): CHOL, HDL, LDLCALC, TRIG, CHOLHDL, LDLDIRECT in the last 72 hours.  Lab Results  Component Value Date   HGBA1C 7.2 (H) 03/21/2016   ------------------------------------------------------------------------------------------------------------------ No results for input(s): TSH, T4TOTAL, T3FREE, THYROIDAB in the last 72 hours.  Invalid input(s): FREET3 ------------------------------------------------------------------------------------------------------------------ No results for input(s): VITAMINB12, FOLATE, FERRITIN, TIBC, IRON, RETICCTPCT in the last 72 hours.  Coagulation profile  Recent Labs Lab 03/29/16 0701  INR 1.24     Recent Labs  03/28/16 1307  DDIMER 11.56*    Cardiac Enzymes  Recent Labs Lab 03/28/16 0059 03/28/16 0557 03/28/16 1307  TROPONINI 1.34* 1.04* 1.27*   ------------------------------------------------------------------------------------------------------------------    Component Value Date/Time   BNP 940.4 (H) 03/28/2016 1308    Inpatient Medications  Scheduled Meds: . aspirin EC  81 mg Oral Daily  . atorvastatin  10 mg Oral QPM  . diltiazem  120 mg Oral Daily  . FLUoxetine  60 mg Oral Daily  . furosemide  20 mg Intravenous BID  . insulin aspart  0-9 Units Subcutaneous TID WC  . insulin aspart  3 Units Subcutaneous TID WC  . insulin glargine  12 Units Subcutaneous QHS  . isosorbide mononitrate  15 mg Oral Daily  . metoprolol tartrate  25 mg Oral BID  . mirtazapine  15 mg Oral QHS  . pantoprazole  40 mg Oral BID  . polyethylene glycol  17 g Oral BID  . traZODone  100 mg Oral QHS   Continuous Infusions:  PRN Meds:.acetaminophen, albuterol, ALPRAZolam, diphenhydrAMINE, docusate sodium, gi cocktail, hydrALAZINE, HYDROcodone-acetaminophen, lip balm,  morphine injection, ondansetron (ZOFRAN) IV, promethazine  Micro Results Recent Results (from the past 240 hour(s))  Culture, blood (routine x 2) Call MD if unable to obtain prior to antibiotics being given     Status: None   Collection Time: 03/21/16  7:53 AM  Result Value Ref Range Status   Specimen Description BLOOD LEFT HAND  Final   Special Requests IN PEDIATRIC BOTTLE 1ML  Final   Culture   Final    NO GROWTH 5 DAYS Performed at [facility]    Report Status 03/26/2016 FINAL  Final  Culture, blood (routine x 2) Call MD if unable to obtain prior to antibiotics being given  Status: None   Collection Time: 03/21/16  8:07 AM  Result Value Ref Range Status   Specimen Description BLOOD LEFT HAND  Final   Special Requests IN PEDIATRIC BOTTLE 2.5ML  Final   Culture   Final    NO GROWTH 5 DAYS Performed at [facility]    Report Status 03/26/2016 FINAL  Final  MRSA PCR Screening     Status: None   Collection Time: 03/21/16 11:50 PM  Result Value Ref Range Status   MRSA by PCR NEGATIVE NEGATIVE Final    Comment:        The GeneXpert MRSA Assay (FDA approved for NASAL specimens only), is one component of a comprehensive MRSA colonization surveillance program. It is not intended to diagnose MRSA infection nor to guide or monitor treatment for MRSA infections.     Radiology Reports Dg Chest 1 View  Result Date: 03/27/2016 CLINICAL DATA:  History of breast carcinoma. Areas of lung infiltrate EXAM: CHEST 1 VIEW COMPARISON:  Chest radiograph March 21, 2016 and chest CT March 22, 2016 FINDINGS: There is persistent interstitial and patchy alveolar opacity bilaterally. Heart is borderline enlarged with the pulmonary vascularity within normal limits. There is aortic atherosclerosis. There is calcification in the mitral annulus. Patient is status post aortic valve replacement. Adenopathy is better seen on recent chest CT than current radiographic examination.  IMPRESSION: Stable appearance of the lungs. Suspect a combination of interstitial and alveolar edema with likely superimposed pneumonia. Stable cardiac silhouette. Adenopathy is better seen on CT than current radiographic examination. Electronically Signed   By: Lowella Grip III M.D.   On: 03/27/2016 12:51   Dg Abd 1 View  Result Date: 03/23/2016 CLINICAL DATA:  Nasogastric tube placement EXAM: ABDOMEN - 1 VIEW COMPARISON:  Portable exam 2112 hours compared to 03/21/2016 CT abdomen and pelvis FINDINGS: Nasogastric tube traverses stomach with tip projecting over the proximal third portion of the duodenum. Bowel gas pattern normal. Degenerative disc disease and scoliosis of the thoracolumbar spine with osseous demineralization. No urinary tract calcification. IMPRESSION: Tip of nasogastric tube traverses stomach and projects over expected position of the third portion of the duodenum ; consider withdrawal 14 cm to place tip at the expected position of the gastric antrum. Findings called to [place] [facility] on 03/23/2016 at 2156 hours. Electronically Signed   By: Lavonia Dana M.D.   On: 03/23/2016 21:57   Ct Chest Wo Contrast  Result Date: 03/22/2016 CLINICAL DATA:  Pneumonia  Increased nausea Chf, dementia Hx breast ca EXAM: CT CHEST WITHOUT CONTRAST TECHNIQUE: Multidetector CT imaging of the chest was performed following the standard protocol without IV contrast. COMPARISON:  Chest radiograph, 03/21/2016.  Chest CT, 11/10/2015. FINDINGS: Neck base and axilla: There several prominent left neck base lymph nodes, largest measuring 9 mm in short axis. No pathologically enlarged lymph nodes in the neck base or in either axilla. No masses. Postsurgical changes are noted along the right axilla extending along the right pectoralis from previous right breast surgery. Cardiovascular: Heart is top-normal in size. There is dense calcification of the mitral valve annulus. There are dense coronary artery  calcifications. Calcified plaque is noted along the aortic arch patch that calcified plaque is noted along the thoracic aorta. Calcified plaque extends into the origin of the innominate artery and left subclavian artery most significantly the left subclavian artery. Mediastinum/Nodes: There is mediastinal and right hilar adenopathy. A prevascular node near the AP window measures 13 mm in short axis. An upper  first anterior mediastinal node measures 9 mm short axis. An azygos level right peritracheal to precarinal node measures 14 mm in short axis. Soft tissue surrounding the right hilum is presumed to be adenopathy, with discrete nodes not well-defined. The areas in the inferior right subcarinal right hilar node measuring 17 mm in short axis. Lungs/Pleura: Patchy confluent and ground-glass type airspace lung opacities seen throughout the right lung predominantly in the right upper lobe. There is a geographic appearing ground-glass opacity are noted in the left upper lobe and, to lesser degree, left lower lobe. This is superimposed on changes of interstitial fibrosis, more evident on the right, which are stable from the prior study. There is a small right pleural effusion. No pneumothorax. Upper Abdomen: No acute findings.  No liver or adrenal masses. Musculoskeletal: Mild degenerative changes of the thoracic spine. No osteoblastic or osteolytic lesions. IMPRESSION: 1. Findings are most consistent with bilateral pneumonia with patchy airspace consolidation and more diffuse hazy ground-glass opacification throughout the right lung, predominantly in the right upper lobe, and to a lesser degree in the left upper lobe and only mildly in the left lower lobe. There is a small right pleural effusion. Associated mediastinal and right hilar adenopathy is noted presumed reactive. The adenopathy is similar to the previous chest CT and may be chronic. The lung opacities could reflect asymmetric edema as opposed to pneumonia.  Electronically Signed   By: Lajean Manes M.D.   On: 03/22/2016 15:42   Ct Angio Chest Pe W Or Wo Contrast  Result Date: 03/28/2016 CLINICAL DATA:  Chest pain, recent pneumonia, history of breast cancer, CHF EXAM: CT ANGIOGRAPHY CHEST WITH CONTRAST TECHNIQUE: Multidetector CT imaging of the chest was performed using the standard protocol during bolus administration of intravenous contrast. Multiplanar CT image reconstructions and MIPs were obtained to evaluate the vascular anatomy. CONTRAST:  100 mL Isovue 370 IV COMPARISON:  CT chest dated 03/22/2016 and 11/10/2015. FINDINGS: Cardiovascular: Satisfactory opacification of the pulmonary arteries to the segmental level. No evidence of pulmonary embolism. Prosthetic aortic valve. Atherosclerotic calcifications of the aortic arch. Mitral valve annular calcifications. Cardiomegaly.  No pericardial effusion. Three vessel coronary atherosclerosis. Mediastinum/Nodes: Chronic thoracic lymphadenopathy, including a dominant 1.7 cm short axis low right paratracheal node (series 4/ image 32) and a 1.6 cm right hilar node (series 4/ image 34), similar to 11/10/2015. Postsurgical changes in the left axilla. Visualized thyroid is unremarkable. Lungs/Pleura: Chronic interstitial lung disease with multifocal areas of bronchiectasis and scattered fibrosis, right upper lobe predominant, progressed since 11/10/2015. Superimposed right upper lobe pneumonia and/or mild interstitial edema is possible. Small right pleural effusion, increased. Trace left pleural effusion, new. No pneumothorax. Upper Abdomen: Visualized upper abdomen is notable for trace perihepatic ascites. Musculoskeletal: Postsurgical changes related to prior right mastectomy. Degenerative changes of the visualized thoracolumbar spine. Median sternotomy. Review of the MIP images confirms the above findings. IMPRESSION: No evidence of pulmonary embolism. Chronic interstitial lung disease, right upper lobe predominant,  progressed since 11/10/2015. Superimposed right upper lobe pneumonia and/or interstitial edema is possible. Small right pleural effusion, increased. Trace left pleural effusion, new. Status post right mastectomy and right axillary lymph node dissection. No findings specific for metastatic disease. Chronic thoracic lymphadenopathy, as described above, possibly reactive. Electronically Signed   By: Julian Hy M.D.   On: 03/28/2016 18:56   Ct Abdomen Pelvis W Contrast  Result Date: 03/21/2016 CLINICAL DATA:  Low hemoglobin and weakness with nausea and history of breast carcinoma EXAM: CT ABDOMEN AND PELVIS WITH  CONTRAST TECHNIQUE: Multidetector CT imaging of the abdomen and pelvis was performed using the standard protocol following bolus administration of intravenous contrast. CONTRAST:  163mL ISOVUE-300 IOPAMIDOL (ISOVUE-300) INJECTION 61% COMPARISON:  None. FINDINGS: Lower chest: Small right-sided pleural effusion is noted. Diffuse fibrotic changes are noted in the bases bilaterally. Some superimposed ground-glass changes are noted which may represent acute infiltrate particularly in the right lower lobe. Coronary calcifications are again noted. Hepatobiliary: No masses or other significant abnormality. Pancreas: No mass, inflammatory changes, or other significant abnormality. Spleen: Within normal limits in size and appearance. Adrenals/Urinary Tract: No masses identified. No evidence of hydronephrosis. Stomach/Bowel: The appendix is not well visualized. Diverticular changes noted without evidence of diverticulitis. Mild fecal material throughout the colon is noted consistent with mild constipation. No obstructive changes are seen. Vascular/Lymphatic: Aortoiliac calcifications are noted without aneurysmal dilatation. No significant lymphadenopathy is noted. Reproductive: No acute abnormality noted. Other: No free fluid is noted. Musculoskeletal: Degenerative changes of the lumbar spine are noted. Mild  scoliosis is seen. IMPRESSION: Small right-sided pleural effusion. Diffuse fibrotic changes are noted in the bases with some mild superimposed ground-glass infiltrate particularly in the right lower lobe. Chronic changes as described above. Electronically Signed   By: Inez Catalina M.D.   On: 03/21/2016 10:19   Dg Chest Port 1 View  Result Date: 03/21/2016 CLINICAL DATA:  Shortness of breath.  History of breast carcinoma EXAM: PORTABLE CHEST 1 VIEW COMPARISON:  February 16, 2016 chest radiograph and chest CT November 10, 2015 FINDINGS: There is widespread interstitial edema with patchy alveolar opacity throughout the left mid lung region as well as much the right lung. Heart is borderline enlarged with pulmonary venous hypertension. There is calcification of the mitral annulus. Patient is status post median sternotomy. Adenopathy noted on previous chest CT is less well appreciated by radiography. No bone lesions are evident. Patient is status post aortic valve replacement. Atherosclerotic calcification is noted in the aorta. IMPRESSION: Findings felt to be most consistent with a degree of congestive heart failure, apparently superimposed on chronic interstitial disease. A degree of superimposed pneumonia cannot be excluded radiographically. More than one of these entities may exist concurrently. Stable cardiac silhouette. Aortic atherosclerosis. Electronically Signed   By: Lowella Grip III M.D.   On: 03/21/2016 07:10    Time Spent in minutes  25   Louellen Molder M.D on 03/29/2016 at 5:50 PM  Between 7am to 7pm - Pager - [phone]  After 7pm go to [email or web address] - password [facility]  [facility] -  Office  [phone]

## 2016-03-30 ENCOUNTER — Inpatient Hospital Stay (HOSPITAL_COMMUNITY): Payer: Medicare Other

## 2016-03-30 DIAGNOSIS — R6510 Systemic inflammatory response syndrome (SIRS) of non-infectious origin without acute organ dysfunction: Secondary | ICD-10-CM

## 2016-03-30 DIAGNOSIS — E43 Unspecified severe protein-calorie malnutrition: Secondary | ICD-10-CM | POA: Diagnosis present

## 2016-03-30 DIAGNOSIS — D72829 Elevated white blood cell count, unspecified: Secondary | ICD-10-CM

## 2016-03-30 DIAGNOSIS — I959 Hypotension, unspecified: Secondary | ICD-10-CM

## 2016-03-30 LAB — COMPREHENSIVE METABOLIC PANEL
ALBUMIN: 2.3 g/dL — AB (ref 3.5–5.0)
ALT: 140 U/L — ABNORMAL HIGH (ref 14–54)
ANION GAP: 5 (ref 5–15)
AST: 46 U/L — AB (ref 15–41)
Alkaline Phosphatase: 79 U/L (ref 38–126)
BILIRUBIN TOTAL: 1.7 mg/dL — AB (ref 0.3–1.2)
BUN: 24 mg/dL — ABNORMAL HIGH (ref 6–20)
CHLORIDE: 96 mmol/L — AB (ref 101–111)
CO2: 33 mmol/L — ABNORMAL HIGH (ref 22–32)
Calcium: 7.9 mg/dL — ABNORMAL LOW (ref 8.9–10.3)
Creatinine, Ser: 1.23 mg/dL — ABNORMAL HIGH (ref 0.44–1.00)
GFR calc Af Amer: 46 mL/min — ABNORMAL LOW (ref >60)
GFR calc non Af Amer: 40 mL/min — ABNORMAL LOW (ref >60)
GLUCOSE: 94 mg/dL (ref 65–99)
POTASSIUM: 4.2 mmol/L (ref 3.5–5.1)
Sodium: 134 mmol/L — ABNORMAL LOW (ref 135–145)
TOTAL PROTEIN: 6.1 g/dL — AB (ref 6.5–8.1)

## 2016-03-30 LAB — CBC
HEMATOCRIT: 30.5 % — AB (ref 36.0–46.0)
Hemoglobin: 9.7 g/dL — ABNORMAL LOW (ref 12.0–15.0)
MCH: 29.6 pg (ref 26.0–34.0)
MCHC: 31.8 g/dL (ref 30.0–36.0)
MCV: 93 fL (ref 78.0–100.0)
PLATELETS: 111 10*3/uL — AB (ref 150–400)
RBC: 3.28 MIL/uL — ABNORMAL LOW (ref 3.87–5.11)
RDW: 16.7 % — AB (ref 11.5–15.5)
WBC: 23.2 10*3/uL — AB (ref 4.0–10.5)

## 2016-03-30 LAB — GLUCOSE, CAPILLARY
GLUCOSE-CAPILLARY: 73 mg/dL (ref 65–99)
Glucose-Capillary: 198 mg/dL — ABNORMAL HIGH (ref 65–99)
Glucose-Capillary: 215 mg/dL — ABNORMAL HIGH (ref 65–99)
Glucose-Capillary: 340 mg/dL — ABNORMAL HIGH (ref 65–99)

## 2016-03-30 LAB — URINE MICROSCOPIC-ADD ON

## 2016-03-30 LAB — URINALYSIS, ROUTINE W REFLEX MICROSCOPIC
GLUCOSE, UA: NEGATIVE mg/dL
KETONES UR: NEGATIVE mg/dL
Nitrite: NEGATIVE
PH: 5.5 (ref 5.0–8.0)
PROTEIN: NEGATIVE mg/dL
Specific Gravity, Urine: 1.022 (ref 1.005–1.030)

## 2016-03-30 LAB — MAGNESIUM: Magnesium: 2.1 mg/dL (ref 1.7–2.4)

## 2016-03-30 LAB — LACTIC ACID, PLASMA
LACTIC ACID, VENOUS: 2.2 mmol/L — AB (ref 0.5–1.9)
LACTIC ACID, VENOUS: 2.9 mmol/L — AB (ref 0.5–1.9)
LACTIC ACID, VENOUS: 3 mmol/L — AB (ref 0.5–1.9)
LACTIC ACID, VENOUS: 2.6 mmol/L — AB (ref 0.5–1.9)

## 2016-03-30 MED ORDER — IOPAMIDOL (ISOVUE-300) INJECTION 61%
100.0000 mL | Freq: Once | INTRAVENOUS | Status: AC | PRN
  Administered 2016-03-30: 80 mL via INTRAVENOUS

## 2016-03-30 MED ORDER — SODIUM CHLORIDE 0.9 % IV BOLUS (SEPSIS)
1000.0000 mL | Freq: Once | INTRAVENOUS | Status: AC
  Administered 2016-03-30: 1000 mL via INTRAVENOUS

## 2016-03-30 MED ORDER — SODIUM CHLORIDE 0.9 % IV BOLUS (SEPSIS)
500.0000 mL | Freq: Once | INTRAVENOUS | Status: AC
  Administered 2016-03-30: 500 mL via INTRAVENOUS

## 2016-03-30 MED ORDER — ADULT MULTIVITAMIN W/MINERALS CH
1.0000 | ORAL_TABLET | Freq: Every day | ORAL | Status: DC
  Administered 2016-03-30 – 2016-04-01 (×3): 1 via ORAL
  Filled 2016-03-30 (×3): qty 1

## 2016-03-30 MED ORDER — DEXTROSE 5 % IV SOLN
1.0000 g | INTRAVENOUS | Status: DC
  Administered 2016-03-30 – 2016-04-01 (×3): 1 g via INTRAVENOUS
  Filled 2016-03-30 (×4): qty 10

## 2016-03-30 MED ORDER — SODIUM CHLORIDE 0.9 % IV BOLUS (SEPSIS)
250.0000 mL | Freq: Once | INTRAVENOUS | Status: AC
  Administered 2016-03-30: 250 mL via INTRAVENOUS

## 2016-03-30 MED ORDER — BOOST / RESOURCE BREEZE PO LIQD
1.0000 | Freq: Three times a day (TID) | ORAL | Status: DC
  Administered 2016-03-30 – 2016-04-01 (×4): 1 via ORAL

## 2016-03-30 MED ORDER — PRO-STAT SUGAR FREE PO LIQD
30.0000 mL | Freq: Two times a day (BID) | ORAL | Status: DC
  Administered 2016-03-30 – 2016-04-01 (×4): 30 mL via ORAL
  Filled 2016-03-30 (×5): qty 30

## 2016-03-30 MED ORDER — FUROSEMIDE 20 MG PO TABS
20.0000 mg | ORAL_TABLET | Freq: Every day | ORAL | Status: DC
  Administered 2016-03-31 – 2016-04-01 (×2): 20 mg via ORAL
  Filled 2016-03-30 (×2): qty 1

## 2016-03-30 MED ORDER — IOPAMIDOL (ISOVUE-300) INJECTION 61%: 100.0000 mL | Freq: Once | INTRAVENOUS | Status: DC | PRN

## 2016-03-30 NOTE — Progress Notes (Signed)
Physical Therapy Treatment Patient Details Name: Vickie Murphy MRN: [id number] DOB: 08-12-1932 Today's Date: 03/30/2016    History of Present Illness 82 y.o. female admitted from [facility] with abdominal pain and nausea. She has PMH of mild dementia, severe weakness walks with walker at baseline, GERD, DM type II now insulin-dependent, chronic congestive heart failure unknown type no echo in chart, history of breast cancer, depression, dyslipidemia. She's been having issues with nausea for the last 3-4 months, underwent EGD at [facility] 2 weeks ago which according to family was completely unremarkable. She also has had multiple episodes of pneumonia requiring 3 courses of antibiotics in the last 1 month.  CTA negative for PE.   Found to also have NSTEMI    PT Comments    Pt requiring more assist today compared to previous session.  Pt assisted up to recliner.  Continue to recommend SNF upon d/c.  Follow Up Recommendations  SNF     Equipment Recommendations  None recommended by PT    Recommendations for Other Services       Precautions / Restrictions Precautions Precautions: Fall    Mobility  Bed Mobility Overal bed mobility: Needs Assistance Bed Mobility: Rolling;Sidelying to Sit Rolling: Mod assist Sidelying to sit: Max assist Supine to sit: +2 for physical assistance     General bed mobility comments: requiring more assist today compared to previous visit, assist for upper and lower body  Transfers Overall transfer level: Needs assistance   Transfers: Squat Pivot Transfers     Squat pivot transfers: Total assist;+2 physical assistance     General transfer comment: pt unable to scoot, assisted to drop arm recliner with squat pivot, cues for opposite head - hips to assist with transfer  Ambulation/Gait                 Stairs            Wheelchair Mobility    Modified Rankin (Stroke Patients Only)       Balance Overall balance assessment: Needs  assistance Sitting-balance support: Bilateral upper extremity supported;Feet supported Sitting balance-Leahy Scale: Zero                              Cognition Arousal/Alertness: Awake/alert Behavior During Therapy: WFL for tasks assessed/performed Overall Cognitive Status: History of cognitive impairments - at baseline                      Exercises      General Comments        Pertinent Vitals/Pain Pain Assessment: No/denies pain    Home Living                      Prior Function            PT Goals (current goals can now be found in the care plan section) Progress towards PT goals: Progressing toward goals    Frequency  Min 3X/week    PT Plan Current plan remains appropriate    Co-evaluation             End of Session Equipment Utilized During Treatment: Gait belt;Oxygen Activity Tolerance: Patient limited by fatigue Patient left: in chair;with call bell/phone within reach;with chair alarm set;with family/visitor present     Time: [id number] PT Time Calculation (min) (ACUTE ONLY): 20 min  Charges:  $Therapeutic Activity: 8-22 mins  G CodesTrena Platt April 28, 2016, 1:55 PM Carmelia Bake, PT, DPT 04/28/2016 Pager: [phone]

## 2016-03-30 NOTE — Progress Notes (Addendum)
PROGRESS NOTE                                                                                                                                                                                                             Patient Demographics:    Vickie Murphy, is a 82 y.o. female, DOB - 1933-01-01, HD:[id number]  Admit date - 03/21/2016   Admitting Physician Thurnell Lose, MD  Outpatient Primary MD for the patient is Glendon Axe, MD  LOS - 9  Outpatient Specialists: none  Chief Complaint  Patient presents with  . Abnormal Lab       Brief Narrative   82 year old female with dementia, CHF, diabetes mellitus, history of breast cancer, depression, dyslipidemia admitted with shortness of breath and found to have pneumonia. Patient treated with IV antibiotic. 2-D echo done was suggestive of CHF. During hospital course patient was found to have acute blood loss anemia and underwent EGD and colonoscopy without findings of active source of bleeding. On 8/21 patient complained of chest pain and was found to have elevated troponin. As it CT angiogram of the chest was done which was negative for PE. Cardiology consulted for further management.   Subjective:   Patient morning and some discomfort. Overnight was hypertensive requiring some IV fluid bolus. Had significantly elevated WBC with some worsening renal function.   Assessment  & Plan :    Principal Problem:   Acute hypoxemic respiratory failure (HCC) Suspected due to multilobar pneumonia. Completed antibiotic course. Blood cultures negative.    Active Problems:  SIRS with Hypotension ? Early sepsis. Elevated lactic acid. Received IV fluid bolus overnight. Check blood cultures, UA and urine cultures. Chest x-ray unremarkable.  NSTEMI Troponin peaked to 1.34. No further chest symptoms. CT angiogram of the chest negative for PE but showing heavy calcification of all 3  coronaries. Per cardiology she is a poor candidate for cardiac catheterization given her dementia and recent severe blood loss anemia. Continue aspirin, beta blocker. Added Imdur. Hold statin given transaminitis.   Abdominal pain Check UA and urine culture. Follow lactic acid. If UA unremarkable will obtain CT of the abdomen and pelvis to rule out underlying infection.  Acute transaminitis New  finding. Follow hepatitis panel. Liver ultrasound showed some cirrhotic changes. LFTs trending down.  Acute on chronic diastolic CHF EF of [id number]. Holding  Lasix given hypertension and worsening renal function.  Acute blood loss anemia.  Hemoglobin as low as 5.1. Underwent EGD and colonoscopy without findings of acute source of bleeding. H&H now stable posttransfusion..  Thrombocytopenia Mild drop. Continue to monitor.  diabetes mellitus without complication ([place])  sliding-scale coverage   Hypokalemia Replenish       Code Status  DO NOT RESUSCITATE  Family Communication ex-husband at bedside  Disposition Plan  :Skilled nursing facility once clinically improved Barrier  For Discharge :  Active symptoms including leukocytosis, hypotension mild acute kidney injury and lower abdominal pain  Consultcardiology  Procedures  :   2-D echo CT angiogram of the chest  DVT Prophylaxis  :   SCDs   Lab Results  Component Value Date   PLT 111 (L) 03/30/2016    Antibiotics  :    Anti-infectives    Start     Dose/Rate Route Frequency Ordered Stop   03/28/16 1800  levofloxacin (LEVAQUIN) tablet 500 mg     500 mg Oral Every evening 03/27/16 1828 03/28/16 1854   03/24/16 1800  levofloxacin (LEVAQUIN) tablet 500 mg  Status:  Discontinued     500 mg Oral Every evening 03/24/16 1610 03/27/16 1828   03/22/16 1000  vancomycin (VANCOCIN) IVPB 750 mg/150 ml premix  Status:  Discontinued     750 mg 150 mL/hr over 60 Minutes Intravenous Every 24 hours 03/21/16 0929 03/24/16 1610   03/21/16 1600   piperacillin-tazobactam (ZOSYN) IVPB 3.375 g  Status:  Discontinued     3.375 g 12.5 mL/hr over 240 Minutes Intravenous Every 8 hours 03/21/16 0930 03/24/16 1610   03/21/16 0900  piperacillin-tazobactam (ZOSYN) IVPB 3.375 g     3.375 g 100 mL/hr over 30 Minutes Intravenous  Once 03/21/16 0846 03/21/16 1200   03/21/16 0900  vancomycin (VANCOCIN) IVPB 1000 mg/200 mL premix     1,000 mg 200 mL/hr over 60 Minutes Intravenous  Once 03/21/16 0847 03/21/16 1230        Objective:   Vitals:   03/29/16 1314 03/29/16 2139 03/30/16 0613 03/30/16 1000  BP: (!) 105/49 (!) 114/48 (!) 84/42 (!) 114/48  Pulse: 76 88 77 90  Resp: 18 20 20    Temp: 97.9 F (36.6 C) 99.3 F (37.4 C) 98.4 F (36.9 C)   TempSrc: Oral Oral Oral   SpO2: 99% 92% (!) 76% 93%  Weight:   61.9 kg (136 lb 8 oz)   Height:        Wt Readings from Last 3 Encounters:  03/30/16 61.9 kg (136 lb 8 oz)  03/15/16 59.6 kg (131 lb 6.4 oz)  02/23/16 62.9 kg (138 lb 9.6 oz)     Intake/Output Summary (Last 24 hours) at 03/30/16 1147 Last data filed at 03/30/16 0400  Gross per 24 hour  Intake                0 ml  Output                0 ml  Net                0 ml     Physical Exam  Gen: Moaning in some discomfort HEENTPallor present, moist mucosa, supple neck Chest: clear b/l, no added sounds CVS: N S1&S2, no murmurs,  GI: soft, ND, bowel sounds present, lower abdominal tenderness, Musculoskeletal: warm, no edema CNS: AAOX1-2, non focal    Data Review:    CBC  Recent Labs Lab 03/24/16 [phone]  03/25/16 0355 03/26/16 1143 03/28/16 1307 03/29/16 0550 03/30/16 0546  WBC 9.0 6.5  --  10.9* 15.9* 23.2*  HGB 9.9* 8.9* 9.1* 10.8* 11.4* 9.7*  HCT 30.9* 28.1* 29.1* 34.4* 35.6* 30.5*  PLT 75* 79*  --  143* 76* 111*  MCV 91.4 92.7  --  94.8 92.5 93.0  MCH 29.3 29.4  --  29.8 29.6 29.6  MCHC 32.0 31.7  --  31.4 32.0 31.8  RDW 16.6* 16.8*  --  16.4* 16.4* 16.7*  LYMPHSABS  --   --   --  1.1 0.5*  --   MONOABS  --    --   --  0.9 1.1*  --   EOSABS  --   --   --  0.2 0.5  --   BASOSABS  --   --   --  0.0 0.0  --     Chemistries   Recent Labs Lab 03/24/16 0432 03/25/16 0355 03/28/16 1307 03/29/16 0550 03/30/16 0546  NA 141 138 137 135 134*  K 3.1* 3.8 4.1 3.4* 4.2  CL 105 103 98* 94* 96*  CO2 29 27 32 31 33*  GLUCOSE 146* 190* 229* 191* 94  BUN 27* 21* 17 15 24*  CREATININE 0.97 0.91 0.95 0.65 1.23*  CALCIUM 7.6* 7.2* 8.5* 8.5* 7.9*  MG  --   --   --  1.6*  --   AST  --   --  82* 57* 46*  ALT  --   --  301* 235* 140*  ALKPHOS  --   --  117 103 79  BILITOT  --   --  1.3* 1.4* 1.7*   ------------------------------------------------------------------------------------------------------------------ No results for input(s): CHOL, HDL, LDLCALC, TRIG, CHOLHDL, LDLDIRECT in the last 72 hours.  Lab Results  Component Value Date   HGBA1C 7.2 (H) 03/21/2016   ------------------------------------------------------------------------------------------------------------------ No results for input(s): TSH, T4TOTAL, T3FREE, THYROIDAB in the last 72 hours.  Invalid input(s): FREET3 ------------------------------------------------------------------------------------------------------------------ No results for input(s): VITAMINB12, FOLATE, FERRITIN, TIBC, IRON, RETICCTPCT in the last 72 hours.  Coagulation profile  Recent Labs Lab 03/29/16 0701  INR 1.24     Recent Labs  03/28/16 1307  DDIMER 11.56*    Cardiac Enzymes  Recent Labs Lab 03/28/16 0059 03/28/16 0557 03/28/16 1307  TROPONINI 1.34* 1.04* 1.27*   ------------------------------------------------------------------------------------------------------------------    Component Value Date/Time   BNP 940.4 (H) 03/28/2016 1308    Inpatient Medications  Scheduled Meds: . aspirin EC  81 mg Oral Daily  . diltiazem  120 mg Oral Daily  . FLUoxetine  60 mg Oral Daily  . furosemide  20 mg Oral Daily  . insulin aspart  0-9  Units Subcutaneous TID WC  . insulin aspart  3 Units Subcutaneous TID WC  . insulin glargine  12 Units Subcutaneous QHS  . isosorbide mononitrate  15 mg Oral Daily  . metoprolol tartrate  25 mg Oral BID  . mirtazapine  15 mg Oral QHS  . pantoprazole  40 mg Oral BID  . polyethylene glycol  17 g Oral BID  . traZODone  100 mg Oral QHS   Continuous Infusions:  PRN Meds:.acetaminophen, albuterol, ALPRAZolam, diphenhydrAMINE, docusate sodium, gi cocktail, hydrALAZINE, HYDROcodone-acetaminophen, lip balm, morphine injection, ondansetron (ZOFRAN) IV, promethazine  Micro Results Recent Results (from the past 240 hour(s))  Culture, blood (routine x 2) Call MD if unable to obtain prior to antibiotics being given     Status: None   Collection Time: 03/21/16  7:53 AM  Result  Value Ref Range Status   Specimen Description BLOOD LEFT HAND  Final   Special Requests IN PEDIATRIC BOTTLE 1ML  Final   Culture   Final    NO GROWTH 5 DAYS Performed at [facility]    Report Status 03/26/2016 FINAL  Final  Culture, blood (routine x 2) Call MD if unable to obtain prior to antibiotics being given     Status: None   Collection Time: 03/21/16  8:07 AM  Result Value Ref Range Status   Specimen Description BLOOD LEFT HAND  Final   Special Requests IN PEDIATRIC BOTTLE 2.5ML  Final   Culture   Final    NO GROWTH 5 DAYS Performed at [facility]    Report Status 03/26/2016 FINAL  Final  MRSA PCR Screening     Status: None   Collection Time: 03/21/16 11:50 PM  Result Value Ref Range Status   MRSA by PCR NEGATIVE NEGATIVE Final    Comment:        The GeneXpert MRSA Assay (FDA approved for NASAL specimens only), is one component of a comprehensive MRSA colonization surveillance program. It is not intended to diagnose MRSA infection nor to guide or monitor treatment for MRSA infections.     Radiology Reports Dg Chest 1 View  Result Date: 03/27/2016 CLINICAL DATA:  History of breast  carcinoma. Areas of lung infiltrate EXAM: CHEST 1 VIEW COMPARISON:  Chest radiograph March 21, 2016 and chest CT March 22, 2016 FINDINGS: There is persistent interstitial and patchy alveolar opacity bilaterally. Heart is borderline enlarged with the pulmonary vascularity within normal limits. There is aortic atherosclerosis. There is calcification in the mitral annulus. Patient is status post aortic valve replacement. Adenopathy is better seen on recent chest CT than current radiographic examination. IMPRESSION: Stable appearance of the lungs. Suspect a combination of interstitial and alveolar edema with likely superimposed pneumonia. Stable cardiac silhouette. Adenopathy is better seen on CT than current radiographic examination. Electronically Signed   By: Lowella Grip III M.D.   On: 03/27/2016 12:51   Dg Abd 1 View  Result Date: 03/23/2016 CLINICAL DATA:  Nasogastric tube placement EXAM: ABDOMEN - 1 VIEW COMPARISON:  Portable exam 2112 hours compared to 03/21/2016 CT abdomen and pelvis FINDINGS: Nasogastric tube traverses stomach with tip projecting over the proximal third portion of the duodenum. Bowel gas pattern normal. Degenerative disc disease and scoliosis of the thoracolumbar spine with osseous demineralization. No urinary tract calcification. IMPRESSION: Tip of nasogastric tube traverses stomach and projects over expected position of the third portion of the duodenum ; consider withdrawal 14 cm to place tip at the expected position of the gastric antrum. Findings called to [place] on [facility] on 03/23/2016 at 2156 hours. Electronically Signed   By: Lavonia Dana M.D.   On: 03/23/2016 21:57   Ct Chest Wo Contrast  Result Date: 03/22/2016 CLINICAL DATA:  Pneumonia  Increased nausea Chf, dementia Hx breast ca EXAM: CT CHEST WITHOUT CONTRAST TECHNIQUE: Multidetector CT imaging of the chest was performed following the standard protocol without IV contrast. COMPARISON:  Chest radiograph,  03/21/2016.  Chest CT, 11/10/2015. FINDINGS: Neck base and axilla: There several prominent left neck base lymph nodes, largest measuring 9 mm in short axis. No pathologically enlarged lymph nodes in the neck base or in either axilla. No masses. Postsurgical changes are noted along the right axilla extending along the right pectoralis from previous right breast surgery. Cardiovascular: Heart is top-normal in size. There is dense calcification of  the mitral valve annulus. There are dense coronary artery calcifications. Calcified plaque is noted along the aortic arch patch that calcified plaque is noted along the thoracic aorta. Calcified plaque extends into the origin of the innominate artery and left subclavian artery most significantly the left subclavian artery. Mediastinum/Nodes: There is mediastinal and right hilar adenopathy. A prevascular node near the AP window measures 13 mm in short axis. An upper first anterior mediastinal node measures 9 mm short axis. An azygos level right peritracheal to precarinal node measures 14 mm in short axis. Soft tissue surrounding the right hilum is presumed to be adenopathy, with discrete nodes not well-defined. The areas in the inferior right subcarinal right hilar node measuring 17 mm in short axis. Lungs/Pleura: Patchy confluent and ground-glass type airspace lung opacities seen throughout the right lung predominantly in the right upper lobe. There is a geographic appearing ground-glass opacity are noted in the left upper lobe and, to lesser degree, left lower lobe. This is superimposed on changes of interstitial fibrosis, more evident on the right, which are stable from the prior study. There is a small right pleural effusion. No pneumothorax. Upper Abdomen: No acute findings.  No liver or adrenal masses. Musculoskeletal: Mild degenerative changes of the thoracic spine. No osteoblastic or osteolytic lesions. IMPRESSION: 1. Findings are most consistent with bilateral  pneumonia with patchy airspace consolidation and more diffuse hazy ground-glass opacification throughout the right lung, predominantly in the right upper lobe, and to a lesser degree in the left upper lobe and only mildly in the left lower lobe. There is a small right pleural effusion. Associated mediastinal and right hilar adenopathy is noted presumed reactive. The adenopathy is similar to the previous chest CT and may be chronic. The lung opacities could reflect asymmetric edema as opposed to pneumonia. Electronically Signed   By: Lajean Manes M.D.   On: 03/22/2016 15:42   Ct Angio Chest Pe W Or Wo Contrast  Result Date: 03/28/2016 CLINICAL DATA:  Chest pain, recent pneumonia, history of breast cancer, CHF EXAM: CT ANGIOGRAPHY CHEST WITH CONTRAST TECHNIQUE: Multidetector CT imaging of the chest was performed using the standard protocol during bolus administration of intravenous contrast. Multiplanar CT image reconstructions and MIPs were obtained to evaluate the vascular anatomy. CONTRAST:  100 mL Isovue 370 IV COMPARISON:  CT chest dated 03/22/2016 and 11/10/2015. FINDINGS: Cardiovascular: Satisfactory opacification of the pulmonary arteries to the segmental level. No evidence of pulmonary embolism. Prosthetic aortic valve. Atherosclerotic calcifications of the aortic arch. Mitral valve annular calcifications. Cardiomegaly.  No pericardial effusion. Three vessel coronary atherosclerosis. Mediastinum/Nodes: Chronic thoracic lymphadenopathy, including a dominant 1.7 cm short axis low right paratracheal node (series 4/ image 32) and a 1.6 cm right hilar node (series 4/ image 34), similar to 11/10/2015. Postsurgical changes in the left axilla. Visualized thyroid is unremarkable. Lungs/Pleura: Chronic interstitial lung disease with multifocal areas of bronchiectasis and scattered fibrosis, right upper lobe predominant, progressed since 11/10/2015. Superimposed right upper lobe pneumonia and/or mild interstitial  edema is possible. Small right pleural effusion, increased. Trace left pleural effusion, new. No pneumothorax. Upper Abdomen: Visualized upper abdomen is notable for trace perihepatic ascites. Musculoskeletal: Postsurgical changes related to prior right mastectomy. Degenerative changes of the visualized thoracolumbar spine. Median sternotomy. Review of the MIP images confirms the above findings. IMPRESSION: No evidence of pulmonary embolism. Chronic interstitial lung disease, right upper lobe predominant, progressed since 11/10/2015. Superimposed right upper lobe pneumonia and/or interstitial edema is possible. Small right pleural effusion, increased. Trace left pleural  effusion, new. Status post right mastectomy and right axillary lymph node dissection. No findings specific for metastatic disease. Chronic thoracic lymphadenopathy, as described above, possibly reactive. Electronically Signed   By: Julian Hy M.D.   On: 03/28/2016 18:56   US Abdomen Complete  Result Date: 03/30/2016 CLINICAL DATA:  Elevated liver function tests. Personal history of breast carcinoma. EXAM: ABDOMEN ULTRASOUND COMPLETE COMPARISON:  CT on 03/21/2016 FINDINGS: Gallbladder: No gallstones identified. Mild gallbladder wall thickening is seen measuring up to 5 mm. There is minimal pericholecystic fluid. No sonographic Murphy sign noted by sonographer. These findings are nonspecific. Common bile duct: Diameter: 5 mm, within normal limits Liver: Coarsened hepatic echotexture and capsular nodularity, highly suspicious for hepatic cirrhosis. No liver masses identified. Portal vein is patent. IVC: No abnormality visualized. Pancreas: Visualized portion unremarkable. Spleen: Size and appearance within normal limits. Right Kidney: Length: 9.6 cm. Echogenicity within normal limits. No mass or hydronephrosis visualized. Left Kidney: Length: 9.8 cm. Echogenicity within normal limits. No mass or hydronephrosis visualized. Abdominal aorta: No  aneurysm visualized. Other findings: Tiny right pleural effusion noted. IMPRESSION: Findings suspicious for hepatic cirrhosis. No liver mass identified. Nonspecific mild gallbladder wall thickening and minimal pericholecystic fluid, which may be secondary to hepatic cirrhosis.No evidence of cholelithiasis or biliary dilatation. No sonographic Murphy's sign noted. Electronically Signed   By: Earle Gell M.D.   On: 03/30/2016 11:16   Ct Abdomen Pelvis W Contrast  Result Date: 03/21/2016 CLINICAL DATA:  Low hemoglobin and weakness with nausea and history of breast carcinoma EXAM: CT ABDOMEN AND PELVIS WITH CONTRAST TECHNIQUE: Multidetector CT imaging of the abdomen and pelvis was performed using the standard protocol following bolus administration of intravenous contrast. CONTRAST:  132mL ISOVUE-300 IOPAMIDOL (ISOVUE-300) INJECTION 61% COMPARISON:  None. FINDINGS: Lower chest: Small right-sided pleural effusion is noted. Diffuse fibrotic changes are noted in the bases bilaterally. Some superimposed ground-glass changes are noted which may represent acute infiltrate particularly in the right lower lobe. Coronary calcifications are again noted. Hepatobiliary: No masses or other significant abnormality. Pancreas: No mass, inflammatory changes, or other significant abnormality. Spleen: Within normal limits in size and appearance. Adrenals/Urinary Tract: No masses identified. No evidence of hydronephrosis. Stomach/Bowel: The appendix is not well visualized. Diverticular changes noted without evidence of diverticulitis. Mild fecal material throughout the colon is noted consistent with mild constipation. No obstructive changes are seen. Vascular/Lymphatic: Aortoiliac calcifications are noted without aneurysmal dilatation. No significant lymphadenopathy is noted. Reproductive: No acute abnormality noted. Other: No free fluid is noted. Musculoskeletal: Degenerative changes of the lumbar spine are noted. Mild scoliosis is  seen. IMPRESSION: Small right-sided pleural effusion. Diffuse fibrotic changes are noted in the bases with some mild superimposed ground-glass infiltrate particularly in the right lower lobe. Chronic changes as described above. Electronically Signed   By: Inez Catalina M.D.   On: 03/21/2016 10:19   Dg Chest Port 1 View  Result Date: 03/30/2016 CLINICAL DATA:  Elevated white blood cell count EXAM: PORTABLE CHEST 1 VIEW COMPARISON:  CT abdomen pelvis of 03/28/2016, and chest x-ray of 03/27/2016 FINDINGS: Lungs are less well aerated with volume loss at the bases. Also there appears to be a small left pleural effusion with airspace disease in the right mid lung and to lesser degree left mid lung as well. Mild cardiomegaly is stable. Heavily calcified mitral annulus is noted. Median sternotomy sutures are present IMPRESSION: Diminished aeration with airspace disease in both mid lungs and probable left pleural effusion. Electronically Signed   By: Eddie Dibbles  Alvester Chou M.D.   On: 03/30/2016 08:08   Dg Chest Port 1 View  Result Date: 03/21/2016 CLINICAL DATA:  Shortness of breath.  History of breast carcinoma EXAM: PORTABLE CHEST 1 VIEW COMPARISON:  February 16, 2016 chest radiograph and chest CT November 10, 2015 FINDINGS: There is widespread interstitial edema with patchy alveolar opacity throughout the left mid lung region as well as much the right lung. Heart is borderline enlarged with pulmonary venous hypertension. There is calcification of the mitral annulus. Patient is status post median sternotomy. Adenopathy noted on previous chest CT is less well appreciated by radiography. No bone lesions are evident. Patient is status post aortic valve replacement. Atherosclerotic calcification is noted in the aorta. IMPRESSION: Findings felt to be most consistent with a degree of congestive heart failure, apparently superimposed on chronic interstitial disease. A degree of superimposed pneumonia cannot be excluded radiographically. More  than one of these entities may exist concurrently. Stable cardiac silhouette. Aortic atherosclerosis. Electronically Signed   By: Lowella Grip III M.D.   On: 03/21/2016 07:10    Time Spent in minutes  35   Louellen Molder M.D on 03/30/2016 at 11:47 AM  Between 7am to 7pm - Pager - [phone]  After 7pm go to [email or web address] - password [email or web address]  [facility] -  Office  [phone]

## 2016-03-30 NOTE — Progress Notes (Signed)
Pt had 2 consecutive 2 second pauses that slowed HR. HR came back up to 90's. Patient was alert and eating at the time. MD made aware. Will continue to monitor.   Callie Fielding RN

## 2016-03-30 NOTE — Progress Notes (Signed)
Critical Lactic 2.2. MD paged to make aware.   Callie Fielding RN

## 2016-03-30 NOTE — Progress Notes (Signed)
Initial Nutrition Assessment  DOCUMENTATION CODES:   Severe malnutrition in context of acute illness/injury  INTERVENTION:  - Will order Boost Breeze po TID, each supplement provides 250 kcal and 9 grams of protein - Will order 30 mL Prostat BID, each supplement provides 100 kcal and 15 grams of protein. - Will order daily multivitamin with minerals. - Continue to encourage PO intakes of meals and supplements.  - RD will continue to monitor for additional nutrition-related needs.  NUTRITION DIAGNOSIS:   Inadequate protein intake related to other (see comment) (current diet order) as evidenced by other (see comment) (CLD does not meet estimated protein needs).  GOAL:   Patient will meet greater than or equal to 90% of their needs  MONITOR:   PO intake, Supplement acceptance, Diet advancement, Weight trends, Labs, Skin, I & O's  REASON FOR ASSESSMENT:   NPO/Clear Liquid Diet  ASSESSMENT:   82 year old female with dementia, CHF, diabetes mellitus, history of breast cancer, depression, dyslipidemia admitted with shortness of breath and found to have pneumonia. Patient treated with IV antibiotic. 2-D echo done was suggestive of CHF. During hospital course patient was found to have acute blood loss anemia and underwent EGD and colonoscopy without findings of active source of bleeding. On 8/21 patient complained of chest pain and was found to have elevated troponin. As it CT angiogram of the chest was done which was negative for PE. Cardiology consulted for further management.  BMI indicates overweight status. Pt has had a complicated medical course since admission. She has mainly been NPO or on CLD since admission on 03/21/16 (9 days). Pt very lethargic at time of visit and all information was obtained from [place], who is at bedside, for this reason.   Since admission pt has become progressively weaker although she was having extensive BLE weakness PTA. She was able to have solid foods over  the weekend and when POA fed her spaghetti on Saturday, "she nearly ate the plate out of my hand." He does not believe that pt has had any abdominal discomfort or nausea with eating. Talked with him about supplements available on CLD and he is interested in trying both Boost Breeze and Prostat, mixed into CLD beverage, to increase protein provision.  Unable to obtain further information from PTA. Physical assessment does not show any muscle or fat wasting wasting, no edema. Chart review indicates stable weight since May 2017. Some fluctuation in weight since admission with CBW down 4 lbs (3% body weight) since admission which is significant for time frame. Pt meets criteria for malnutrition based on this and <50% energy intakes for >/= 5 days. Weight loss may be related to fluid so will need to monitor more closely at follow-up.   Medications reviewed; PRN Colace, 20 mg oral Lasix/day, sliding scale Novolog, 3 units Novolog TID, 12 units Lantus/day, PRN IV Zofran, 40 mg Protonix BID.  Labs reviewed; CBGs: 73 and 198 mg/dL today, Na: 134 mmol/L, Cl: 96 mmol/L, BUN: 24 mg/dL, creatinine: 1.23 mg/dL, Ca: 7.9 mg/dL, AST/ALT elevated, lactic acid: 2.2 mmol/L, GFR: 40 mL/min.    Diet Order:  Diet clear liquid Room service appropriate? Yes; Fluid consistency: Thin  Skin:  Reviewed, no issues (MSAD abdomen and R arm)  Last BM:  8/21  Height:   Ht Readings from Last 1 Encounters:  03/27/16 5\' 3"  (1.6 m)    Weight:   Wt Readings from Last 1 Encounters:  03/30/16 136 lb 8 oz (61.9 kg)    Ideal Body Weight:  52.27 kg  BMI:  Body mass index is 24.18 kg/m.  Estimated Nutritional Needs:   Kcal:  1300-1500  Protein:  55-65 grams  Fluid:  >/= 1.5 L/day  EDUCATION NEEDS:   No education needs identified at this time    Jarome Matin, MS, RD, LDN Inpatient Clinical Dietitian Pager # [phone] After hours/weekend pager # [phone]

## 2016-03-31 DIAGNOSIS — A419 Sepsis, unspecified organism: Secondary | ICD-10-CM

## 2016-03-31 DIAGNOSIS — N390 Urinary tract infection, site not specified: Secondary | ICD-10-CM

## 2016-03-31 DIAGNOSIS — E43 Unspecified severe protein-calorie malnutrition: Secondary | ICD-10-CM

## 2016-03-31 LAB — URINE CULTURE

## 2016-03-31 LAB — CBC
HCT: 30.9 % — ABNORMAL LOW (ref 36.0–46.0)
Hemoglobin: 9.7 g/dL — ABNORMAL LOW (ref 12.0–15.0)
MCH: 29.4 pg (ref 26.0–34.0)
MCHC: 31.4 g/dL (ref 30.0–36.0)
MCV: 93.6 fL (ref 78.0–100.0)
Platelets: 131 10*3/uL — ABNORMAL LOW (ref 150–400)
RBC: 3.3 MIL/uL — ABNORMAL LOW (ref 3.87–5.11)
RDW: 16.9 % — AB (ref 11.5–15.5)
WBC: 18.8 10*3/uL — ABNORMAL HIGH (ref 4.0–10.5)

## 2016-03-31 LAB — LACTIC ACID, PLASMA
LACTIC ACID, VENOUS: 1.4 mmol/L (ref 0.5–1.9)
LACTIC ACID, VENOUS: 2 mmol/L — AB (ref 0.5–1.9)

## 2016-03-31 LAB — COMPREHENSIVE METABOLIC PANEL
ALBUMIN: 2.3 g/dL — AB (ref 3.5–5.0)
ALK PHOS: 109 U/L (ref 38–126)
ALT: 112 U/L — ABNORMAL HIGH (ref 14–54)
ANION GAP: 9 (ref 5–15)
AST: 47 U/L — ABNORMAL HIGH (ref 15–41)
BILIRUBIN TOTAL: 1.5 mg/dL — AB (ref 0.3–1.2)
BUN: 36 mg/dL — ABNORMAL HIGH (ref 6–20)
CALCIUM: 7.7 mg/dL — AB (ref 8.9–10.3)
CO2: 28 mmol/L (ref 22–32)
Chloride: 96 mmol/L — ABNORMAL LOW (ref 101–111)
Creatinine, Ser: 1.31 mg/dL — ABNORMAL HIGH (ref 0.44–1.00)
GFR calc non Af Amer: 37 mL/min — ABNORMAL LOW (ref >60)
GFR, EST AFRICAN AMERICAN: 43 mL/min — AB (ref >60)
GLUCOSE: 189 mg/dL — AB (ref 65–99)
POTASSIUM: 3.8 mmol/L (ref 3.5–5.1)
SODIUM: 133 mmol/L — AB (ref 135–145)
TOTAL PROTEIN: 6.5 g/dL (ref 6.5–8.1)

## 2016-03-31 LAB — HEPATITIS PANEL, ACUTE
HCV Ab: <0.1 s/co ratio (ref 0.0–0.9)
HEP A IGM: NEGATIVE
HEP A IGM: NEGATIVE
HEP B C IGM: NEGATIVE
HEP B C IGM: NEGATIVE
HEP B S AG: NEGATIVE
Hepatitis B Surface Ag: NEGATIVE

## 2016-03-31 LAB — GLUCOSE, CAPILLARY
GLUCOSE-CAPILLARY: 133 mg/dL — AB (ref 65–99)
Glucose-Capillary: 168 mg/dL — ABNORMAL HIGH (ref 65–99)
Glucose-Capillary: 109 mg/dL — ABNORMAL HIGH (ref 65–99)
Glucose-Capillary: 151 mg/dL — ABNORMAL HIGH (ref 65–99)

## 2016-03-31 MED ORDER — HYDRALAZINE HCL 20 MG/ML IJ SOLN: 10.0000 mg | Freq: Four times a day (QID) | INTRAMUSCULAR | Status: DC | PRN

## 2016-03-31 MED ORDER — INSULIN ASPART 100 UNIT/ML ~~LOC~~ SOLN: 0.0000 [IU] | Freq: Three times a day (TID) | SUBCUTANEOUS | Status: DC

## 2016-03-31 MED ORDER — FUROSEMIDE 10 MG/ML IJ SOLN
40.0000 mg | Freq: Once | INTRAMUSCULAR | Status: AC
  Administered 2016-03-31: 40 mg via INTRAVENOUS
  Filled 2016-03-31: qty 4

## 2016-03-31 NOTE — Progress Notes (Signed)
Lactic Acid 2.0. MD paged to make aware

## 2016-03-31 NOTE — Progress Notes (Signed)
PROGRESS NOTE                                                                                                                                                                                                             Patient Demographics:    Vickie Murphy, is a 82 y.o. female, DOB - [birth year], HD:[id number]  Admit date - 03/21/2016   Admitting Physician Thurnell Lose, MD  Outpatient Primary MD for the patient is Glendon Axe, MD  LOS - 10  Outpatient Specialists: none  Chief Complaint  Patient presents with  . Abnormal Lab       Brief Narrative   82 year old female with dementia, CHF, diabetes mellitus, history of breast cancer, depression, dyslipidemia admitted with shortness of breath and found to have pneumonia. Patient treated with IV antibiotic. 2-D echo done was suggestive of CHF. During hospital course patient was found to have acute blood loss anemia and underwent EGD and colonoscopy without findings of active source of bleeding. On 8/21 patient complained of chest pain and was found to have elevated troponin. As it CT angiogram of the chest was done which was negative for PE. Cardiology consulted for further management.    Subjective:   Patient in some discomfort with dyspnea. Remains afebrile.   Assessment  & Plan :    Principal Problem:   Acute hypoxemic respiratory failure (HCC) Suspected due to multilobar pneumonia. Completed antibiotic course. Blood cultures negative.    Active Problems:  Sepsis Suspect due to UTI and acute hepatitis versus a cath was cholecystitis. Now resolved. Blood cultures negative for growth. Started on empiric Rocephin and for UTI. Follow cultures. Lactic acid normalized. Leukocytosis improving.   Abdominal pain with transaminitis Suspect abdominal pain due to UTI. Improved today. Transaminitis possibly due to acute hepatitis. No clear etiology. Ultrasound abdomen  shows hepatic cirrhosis, without cholecystitis. LFTs trending down. CT abdomen showing biliary distention without cholelithiasis and mild swelling of the liver.   NSTEMI Troponin peaked to 1.34. No further chest symptoms. CT angiogram of the chest negative for PE but showing heavy calcification of all 3 coronaries. Per cardiology she is a poor candidate for cardiac catheterization given her dementia and recent severe blood loss anemia. Continue aspirin, beta blocker. Added Imdur. Hold statin given transaminitis.     Acute on chronic diastolic CHF EF of [id number].  Resume Lasix as patient appears congestion.  Acute blood loss anemia.  Hemoglobin as low as 5.1. Underwent EGD and colonoscopy without findings of acute source of bleeding. H&H now stable posttransfusion..  Thrombocytopenia Mild drop. Continue to monitor.  diabetes mellitus without complication (HCC)  sliding-scale coverage   Hypokalemia Replenished  Failure to thrive Has underlying moderate dementia. Patient appears severely deconditioned. Discussed with husband who is healthcare proxy on goals of care and he agrees with having a palliative care discussion.       Code Status  DO NOT RESUSCITATE  Family Communication ex-husband at bedside  Disposition Plan  :Skilled nursing facility possibly early next week pending improvement in symptoms and palliative care discussion for goals of care Barrier  For Discharge :   Active symptoms including leukocytosis, hypotension mild acute kidney injury and lower abdominal pain  Consult: Cardiology Palliative care  Procedures  :   2-D echo CT angiogram of the chest CT abdomen Ultrasound abdomen  DVT Prophylaxis  :   SCDs   Lab Results  Component Value Date   PLT 131 (L) 03/31/2016    Antibiotics  :    Anti-infectives    Start     Dose/Rate Route Frequency Ordered Stop   03/30/16 1600  cefTRIAXone (ROCEPHIN) 1 g in dextrose 5 % 50 mL IVPB     1 g 100 mL/hr over  30 Minutes Intravenous Every 24 hours 03/30/16 1450     03/28/16 1800  levofloxacin (LEVAQUIN) tablet 500 mg     500 mg Oral Every evening 03/27/16 1828 03/28/16 1854   03/24/16 1800  levofloxacin (LEVAQUIN) tablet 500 mg  Status:  Discontinued     500 mg Oral Every evening 03/24/16 1610 03/27/16 1828   03/22/16 1000  vancomycin (VANCOCIN) IVPB 750 mg/150 ml premix  Status:  Discontinued     750 mg 150 mL/hr over 60 Minutes Intravenous Every 24 hours 03/21/16 0929 03/24/16 1610   03/21/16 1600  piperacillin-tazobactam (ZOSYN) IVPB 3.375 g  Status:  Discontinued     3.375 g 12.5 mL/hr over 240 Minutes Intravenous Every 8 hours 03/21/16 0930 03/24/16 1610   03/21/16 0900  piperacillin-tazobactam (ZOSYN) IVPB 3.375 g     3.375 g 100 mL/hr over 30 Minutes Intravenous  Once 03/21/16 0846 03/21/16 1200   03/21/16 0900  vancomycin (VANCOCIN) IVPB 1000 mg/200 mL premix     1,000 mg 200 mL/hr over 60 Minutes Intravenous  Once 03/21/16 0847 03/21/16 1230        Objective:   Vitals:   03/30/16 2113 03/31/16 0554 03/31/16 0955 03/31/16 1155  BP:  (!) 118/54 (!) 120/50 (!) 109/53  Pulse:  (!) 108 98 97  Resp:  20  (!) 24  Temp: 98.1 F (36.7 C) 98.9 F (37.2 C)  97.8 F (36.6 C)  TempSrc: Oral Axillary  Oral  SpO2:  93%  94%  Weight:  64.4 kg (141 lb 14.4 oz)    Height:        Wt Readings from Last 3 Encounters:  03/31/16 64.4 kg (141 lb 14.4 oz)  03/15/16 59.6 kg (131 lb 6.4 oz)  02/23/16 62.9 kg (138 lb 9.6 oz)     Intake/Output Summary (Last 24 hours) at 03/31/16 1430 Last data filed at 03/30/16 1533  Gross per 24 hour  Intake             1000 ml  Output  0 ml  Net             1000 ml     Physical Exam  Gen: In some distress HEENT moist mucosa, supple neck Chest: Fine bibasilar crackles CVS: N S1&S2, no murmurs,  GI: soft, ND, bowel sounds present, lower abdominal tenderness, Musculoskeletal: warm, no edema CNS: AAOX1-2, non focal    Data Review:     CBC  Recent Labs Lab 03/25/16 0355 03/26/16 1143 03/28/16 1307 03/29/16 0550 03/30/16 0546 03/31/16 0523  WBC 6.5  --  10.9* 15.9* 23.2* 18.8*  HGB 8.9* 9.1* 10.8* 11.4* 9.7* 9.7*  HCT 28.1* 29.1* 34.4* 35.6* 30.5* 30.9*  PLT 79*  --  143* 76* 111* 131*  MCV 92.7  --  94.8 92.5 93.0 93.6  MCH 29.4  --  29.8 29.6 29.6 29.4  MCHC 31.7  --  31.4 32.0 31.8 31.4  RDW 16.8*  --  16.4* 16.4* 16.7* 16.9*  LYMPHSABS  --   --  1.1 0.5*  --   --   MONOABS  --   --  0.9 1.1*  --   --   EOSABS  --   --  0.2 0.5  --   --   BASOSABS  --   --  0.0 0.0  --   --     Chemistries   Recent Labs Lab 03/25/16 0355 03/28/16 1307 03/29/16 0550 03/30/16 0546 03/30/16 1903 03/31/16 0523  NA 138 137 135 134*  --  133*  K 3.8 4.1 3.4* 4.2  --  3.8  CL 103 98* 94* 96*  --  96*  CO2 27 32 31 33*  --  28  GLUCOSE 190* 229* 191* 94  --  189*  BUN 21* 17 15 24*  --  36*  CREATININE 0.91 0.95 0.65 1.23*  --  1.31*  CALCIUM 7.2* 8.5* 8.5* 7.9*  --  7.7*  MG  --   --  1.6*  --  2.1  --   AST  --  82* 57* 46*  --  47*  ALT  --  301* 235* 140*  --  112*  ALKPHOS  --  117 103 79  --  109  BILITOT  --  1.3* 1.4* 1.7*  --  1.5*   ------------------------------------------------------------------------------------------------------------------ No results for input(s): CHOL, HDL, LDLCALC, TRIG, CHOLHDL, LDLDIRECT in the last 72 hours.  Lab Results  Component Value Date   HGBA1C 7.2 (H) 03/21/2016   ------------------------------------------------------------------------------------------------------------------ No results for input(s): TSH, T4TOTAL, T3FREE, THYROIDAB in the last 72 hours.  Invalid input(s): FREET3 ------------------------------------------------------------------------------------------------------------------ No results for input(s): VITAMINB12, FOLATE, FERRITIN, TIBC, IRON, RETICCTPCT in the last 72 hours.  Coagulation profile  Recent Labs Lab 03/29/16 0701  INR 1.24     No results for input(s): DDIMER in the last 72 hours.  Cardiac Enzymes  Recent Labs Lab 03/28/16 0059 03/28/16 0557 03/28/16 1307  TROPONINI 1.34* 1.04* 1.27*   ------------------------------------------------------------------------------------------------------------------    Component Value Date/Time   BNP 940.4 (H) 03/28/2016 1308    Inpatient Medications  Scheduled Meds: . aspirin EC  81 mg Oral Daily  . cefTRIAXone (ROCEPHIN)  IV  1 g Intravenous Q24H  . diltiazem  120 mg Oral Daily  . feeding supplement  1 Container Oral TID BM  . feeding supplement (PRO-STAT SUGAR FREE 64)  30 mL Oral BID  . FLUoxetine  60 mg Oral Daily  . furosemide  20 mg Oral Daily  . insulin aspart  0-9 Units  Subcutaneous TID WC  . insulin aspart  3 Units Subcutaneous TID WC  . insulin glargine  12 Units Subcutaneous QHS  . isosorbide mononitrate  15 mg Oral Daily  . metoprolol tartrate  25 mg Oral BID  . mirtazapine  15 mg Oral QHS  . multivitamin with minerals  1 tablet Oral Daily  . pantoprazole  40 mg Oral BID  . polyethylene glycol  17 g Oral BID  . traZODone  100 mg Oral QHS   Continuous Infusions:  PRN Meds:.acetaminophen, albuterol, ALPRAZolam, diphenhydrAMINE, docusate sodium, gi cocktail, hydrALAZINE, HYDROcodone-acetaminophen, iopamidol, lip balm, morphine injection, ondansetron (ZOFRAN) IV, promethazine  Micro Results Recent Results (from the past 240 hour(s))  MRSA PCR Screening     Status: None   Collection Time: 03/21/16 11:50 PM  Result Value Ref Range Status   MRSA by PCR NEGATIVE NEGATIVE Final    Comment:        The GeneXpert MRSA Assay (FDA approved for NASAL specimens only), is one component of a comprehensive MRSA colonization surveillance program. It is not intended to diagnose MRSA infection nor to guide or monitor treatment for MRSA infections.   Culture, Urine     Status: None (Preliminary result)   Collection Time: 03/30/16  1:10 PM  Result Value  Ref Range Status   Specimen Description URINE, CLEAN CATCH  Final   Special Requests NONE  Final   Culture   Final    CULTURE REINCUBATED FOR BETTER GROWTH Performed at [facility]    Report Status PENDING  Incomplete    Radiology Reports Dg Chest 1 View  Result Date: 03/27/2016 CLINICAL DATA:  History of breast carcinoma. Areas of lung infiltrate EXAM: CHEST 1 VIEW COMPARISON:  Chest radiograph March 21, 2016 and chest CT March 22, 2016 FINDINGS: There is persistent interstitial and patchy alveolar opacity bilaterally. Heart is borderline enlarged with the pulmonary vascularity within normal limits. There is aortic atherosclerosis. There is calcification in the mitral annulus. Patient is status post aortic valve replacement. Adenopathy is better seen on recent chest CT than current radiographic examination. IMPRESSION: Stable appearance of the lungs. Suspect a combination of interstitial and alveolar edema with likely superimposed pneumonia. Stable cardiac silhouette. Adenopathy is better seen on CT than current radiographic examination. Electronically Signed   By: Lowella Grip III M.D.   On: 03/27/2016 12:51   Dg Abd 1 View  Result Date: 03/23/2016 CLINICAL DATA:  Nasogastric tube placement EXAM: ABDOMEN - 1 VIEW COMPARISON:  Portable exam 2112 hours compared to 03/21/2016 CT abdomen and pelvis FINDINGS: Nasogastric tube traverses stomach with tip projecting over the proximal third portion of the duodenum. Bowel gas pattern normal. Degenerative disc disease and scoliosis of the thoracolumbar spine with osseous demineralization. No urinary tract calcification. IMPRESSION: Tip of nasogastric tube traverses stomach and projects over expected position of the third portion of the duodenum ; consider withdrawal 14 cm to place tip at the expected position of the gastric antrum. Findings called to [place] on [facility] on 03/23/2016 at 2156 hours. Electronically Signed   By: Lavonia Dana M.D.    On: 03/23/2016 21:57   Ct Chest Wo Contrast  Result Date: 03/22/2016 CLINICAL DATA:  Pneumonia  Increased nausea Chf, dementia Hx breast ca EXAM: CT CHEST WITHOUT CONTRAST TECHNIQUE: Multidetector CT imaging of the chest was performed following the standard protocol without IV contrast. COMPARISON:  Chest radiograph, 03/21/2016.  Chest CT, 11/10/2015. FINDINGS: Neck base and axilla: There several prominent left neck base  lymph nodes, largest measuring 9 mm in short axis. No pathologically enlarged lymph nodes in the neck base or in either axilla. No masses. Postsurgical changes are noted along the right axilla extending along the right pectoralis from previous right breast surgery. Cardiovascular: Heart is top-normal in size. There is dense calcification of the mitral valve annulus. There are dense coronary artery calcifications. Calcified plaque is noted along the aortic arch patch that calcified plaque is noted along the thoracic aorta. Calcified plaque extends into the origin of the innominate artery and left subclavian artery most significantly the left subclavian artery. Mediastinum/Nodes: There is mediastinal and right hilar adenopathy. A prevascular node near the AP window measures 13 mm in short axis. An upper first anterior mediastinal node measures 9 mm short axis. An azygos level right peritracheal to precarinal node measures 14 mm in short axis. Soft tissue surrounding the right hilum is presumed to be adenopathy, with discrete nodes not well-defined. The areas in the inferior right subcarinal right hilar node measuring 17 mm in short axis. Lungs/Pleura: Patchy confluent and ground-glass type airspace lung opacities seen throughout the right lung predominantly in the right upper lobe. There is a geographic appearing ground-glass opacity are noted in the left upper lobe and, to lesser degree, left lower lobe. This is superimposed on changes of interstitial fibrosis, more evident on the right, which  are stable from the prior study. There is a small right pleural effusion. No pneumothorax. Upper Abdomen: No acute findings.  No liver or adrenal masses. Musculoskeletal: Mild degenerative changes of the thoracic spine. No osteoblastic or osteolytic lesions. IMPRESSION: 1. Findings are most consistent with bilateral pneumonia with patchy airspace consolidation and more diffuse hazy ground-glass opacification throughout the right lung, predominantly in the right upper lobe, and to a lesser degree in the left upper lobe and only mildly in the left lower lobe. There is a small right pleural effusion. Associated mediastinal and right hilar adenopathy is noted presumed reactive. The adenopathy is similar to the previous chest CT and may be chronic. The lung opacities could reflect asymmetric edema as opposed to pneumonia. Electronically Signed   By: Lajean Manes M.D.   On: 03/22/2016 15:42   Ct Angio Chest Pe W Or Wo Contrast  Result Date: 03/28/2016 CLINICAL DATA:  Chest pain, recent pneumonia, history of breast cancer, CHF EXAM: CT ANGIOGRAPHY CHEST WITH CONTRAST TECHNIQUE: Multidetector CT imaging of the chest was performed using the standard protocol during bolus administration of intravenous contrast. Multiplanar CT image reconstructions and MIPs were obtained to evaluate the vascular anatomy. CONTRAST:  100 mL Isovue 370 IV COMPARISON:  CT chest dated 03/22/2016 and 11/10/2015. FINDINGS: Cardiovascular: Satisfactory opacification of the pulmonary arteries to the segmental level. No evidence of pulmonary embolism. Prosthetic aortic valve. Atherosclerotic calcifications of the aortic arch. Mitral valve annular calcifications. Cardiomegaly.  No pericardial effusion. Three vessel coronary atherosclerosis. Mediastinum/Nodes: Chronic thoracic lymphadenopathy, including a dominant 1.7 cm short axis low right paratracheal node (series 4/ image 32) and a 1.6 cm right hilar node (series 4/ image 34), similar to  11/10/2015. Postsurgical changes in the left axilla. Visualized thyroid is unremarkable. Lungs/Pleura: Chronic interstitial lung disease with multifocal areas of bronchiectasis and scattered fibrosis, right upper lobe predominant, progressed since 11/10/2015. Superimposed right upper lobe pneumonia and/or mild interstitial edema is possible. Small right pleural effusion, increased. Trace left pleural effusion, new. No pneumothorax. Upper Abdomen: Visualized upper abdomen is notable for trace perihepatic ascites. Musculoskeletal: Postsurgical changes related to prior right mastectomy.  Degenerative changes of the visualized thoracolumbar spine. Median sternotomy. Review of the MIP images confirms the above findings. IMPRESSION: No evidence of pulmonary embolism. Chronic interstitial lung disease, right upper lobe predominant, progressed since 11/10/2015. Superimposed right upper lobe pneumonia and/or interstitial edema is possible. Small right pleural effusion, increased. Trace left pleural effusion, new. Status post right mastectomy and right axillary lymph node dissection. No findings specific for metastatic disease. Chronic thoracic lymphadenopathy, as described above, possibly reactive. Electronically Signed   By: Julian Hy M.D.   On: 03/28/2016 18:56   US Abdomen Complete  Result Date: 03/30/2016 CLINICAL DATA:  Elevated liver function tests. Personal history of breast carcinoma. EXAM: ABDOMEN ULTRASOUND COMPLETE COMPARISON:  CT on 03/21/2016 FINDINGS: Gallbladder: No gallstones identified. Mild gallbladder wall thickening is seen measuring up to 5 mm. There is minimal pericholecystic fluid. No sonographic Murphy sign noted by sonographer. These findings are nonspecific. Common bile duct: Diameter: 5 mm, within normal limits Liver: Coarsened hepatic echotexture and capsular nodularity, highly suspicious for hepatic cirrhosis. No liver masses identified. Portal vein is patent. IVC: No abnormality  visualized. Pancreas: Visualized portion unremarkable. Spleen: Size and appearance within normal limits. Right Kidney: Length: 9.6 cm. Echogenicity within normal limits. No mass or hydronephrosis visualized. Left Kidney: Length: 9.8 cm. Echogenicity within normal limits. No mass or hydronephrosis visualized. Abdominal aorta: No aneurysm visualized. Other findings: Tiny right pleural effusion noted. IMPRESSION: Findings suspicious for hepatic cirrhosis. No liver mass identified. Nonspecific mild gallbladder wall thickening and minimal pericholecystic fluid, which may be secondary to hepatic cirrhosis.No evidence of cholelithiasis or biliary dilatation. No sonographic Murphy's sign noted. Electronically Signed   By: Earle Gell M.D.   On: 03/30/2016 11:16   Ct Abdomen Pelvis W Contrast  Result Date: 03/30/2016 CLINICAL DATA:  Pt diagnosed with systemic inflammatory response syndrome.82 y.o. female admitted from [facility] with abdominal pain and nausea. She has PMH of mild dementia, severe weakness walks with walker at baseline, GERD, DM type II now insulin-dependent, chronic congestive heart failure unknown type no echo in chart, history of breast cancer, depression, dyslipidemia. She's been having issues with nausea for the last 3-4 months. EXAM: CT ABDOMEN AND PELVIS WITH CONTRAST TECHNIQUE: Multidetector CT imaging of the abdomen and pelvis was performed using the standard protocol following bolus administration of intravenous contrast. CONTRAST:  32mL ISOVUE-300 IOPAMIDOL (ISOVUE-300) INJECTION 61% COMPARISON:  03/21/2016 FINDINGS: Lung bases: Coarse reticular opacities, patchy areas of consolidation well as more hazy ground-glass opacity and interstitial thickening is noted at the lung bases, right greater than left. There is a small right pleural effusion. Heart is mildly enlarged. There stable changes from an aortic valve replacement. Hepatobiliary: Mild heterogeneous attenuation. There is enhancement adjacent to  the gallbladder. No discrete liver mass. Liver is normal in size. Gallbladder is dilated. Enhancement is seen within the liver adjacent to the gallbladder that indents the inferior margin of the right lobe. There is also hazy inflammatory type change in the right upper quadrant. No bile duct dilation. No convincing gallstones. Spleen, pancreas, adrenal glands:  Unremarkable. Kidneys, ureters, bladder: Mild renal cortical thinning. Focus of renal scarring along the lateral aspect of the upper pole the right kidney. No renal masses or stones. No hydronephrosis. Normal ureters. Bladder is unremarkable. Uterus and adnexa:  Uterus surgically absent.  No adnexal masses. Lymph nodes: Several prominent nodes noted along the gastrohepatic ligament none pathologically enlarged. Ascites: Small amount ascites is seen adjacent to the liver and collecting in the posterior pelvic recess. Gastrointestinal: There  are scattered colonic diverticula without evidence of diverticulitis. No bowel wall thickening or inflammatory changes. Appendix not visualized. Musculoskeletal: There is a prominent scoliosis, curved to the left in the lumbar spine. Advanced degenerative changes are noted throughout the lumbar spine. No osteoblastic or osteolytic lesions. Vascular: Atherosclerotic calcifications are noted throughout the abdominal aorta its branch vessels. No aneurysm. IMPRESSION: 1. There is heterogeneous attenuation of the liver as well as enhancement along the inferior aspect of the right lobe where the distended gallbladder indents the inferior margin of the liver. Inflammatory changes are noted in the right upper quadrant as well as a small amount ascites. Findings may reflect acute acalculous cholecystitis. Alternatively, findings may be from liver inflammation. 2. Lung bases are similar to the prior chest CT. There is a combination of chronic interstitial fibrosis with additional areas of patchy ground-glass and more confluent  airspace opacity. There is also a small right pleural effusion. Findings may reflect infection/ inflammation or be due to a pulmonary edema superimposed on chronic interstitial fibrosis. 3. No other evidence of an acute abnormality within the abdomen pelvis. Electronically Signed   By: Lajean Manes M.D.   On: 03/30/2016 21:02   Ct Abdomen Pelvis W Contrast  Result Date: 03/21/2016 CLINICAL DATA:  Low hemoglobin and weakness with nausea and history of breast carcinoma EXAM: CT ABDOMEN AND PELVIS WITH CONTRAST TECHNIQUE: Multidetector CT imaging of the abdomen and pelvis was performed using the standard protocol following bolus administration of intravenous contrast. CONTRAST:  158mL ISOVUE-300 IOPAMIDOL (ISOVUE-300) INJECTION 61% COMPARISON:  None. FINDINGS: Lower chest: Small right-sided pleural effusion is noted. Diffuse fibrotic changes are noted in the bases bilaterally. Some superimposed ground-glass changes are noted which may represent acute infiltrate particularly in the right lower lobe. Coronary calcifications are again noted. Hepatobiliary: No masses or other significant abnormality. Pancreas: No mass, inflammatory changes, or other significant abnormality. Spleen: Within normal limits in size and appearance. Adrenals/Urinary Tract: No masses identified. No evidence of hydronephrosis. Stomach/Bowel: The appendix is not well visualized. Diverticular changes noted without evidence of diverticulitis. Mild fecal material throughout the colon is noted consistent with mild constipation. No obstructive changes are seen. Vascular/Lymphatic: Aortoiliac calcifications are noted without aneurysmal dilatation. No significant lymphadenopathy is noted. Reproductive: No acute abnormality noted. Other: No free fluid is noted. Musculoskeletal: Degenerative changes of the lumbar spine are noted. Mild scoliosis is seen. IMPRESSION: Small right-sided pleural effusion. Diffuse fibrotic changes are noted in the bases with  some mild superimposed ground-glass infiltrate particularly in the right lower lobe. Chronic changes as described above. Electronically Signed   By: Inez Catalina M.D.   On: 03/21/2016 10:19   Dg Chest Port 1 View  Result Date: 03/30/2016 CLINICAL DATA:  Elevated white blood cell count EXAM: PORTABLE CHEST 1 VIEW COMPARISON:  CT abdomen pelvis of 03/28/2016, and chest x-ray of 03/27/2016 FINDINGS: Lungs are less well aerated with volume loss at the bases. Also there appears to be a small left pleural effusion with airspace disease in the right mid lung and to lesser degree left mid lung as well. Mild cardiomegaly is stable. Heavily calcified mitral annulus is noted. Median sternotomy sutures are present IMPRESSION: Diminished aeration with airspace disease in both mid lungs and probable left pleural effusion. Electronically Signed   By: Ivar Drape M.D.   On: 03/30/2016 08:08   Dg Chest Port 1 View  Result Date: 03/21/2016 CLINICAL DATA:  Shortness of breath.  History of breast carcinoma EXAM: PORTABLE CHEST 1 VIEW COMPARISON:  February 16, 2016 chest radiograph and chest CT November 10, 2015 FINDINGS: There is widespread interstitial edema with patchy alveolar opacity throughout the left mid lung region as well as much the right lung. Heart is borderline enlarged with pulmonary venous hypertension. There is calcification of the mitral annulus. Patient is status post median sternotomy. Adenopathy noted on previous chest CT is less well appreciated by radiography. No bone lesions are evident. Patient is status post aortic valve replacement. Atherosclerotic calcification is noted in the aorta. IMPRESSION: Findings felt to be most consistent with a degree of congestive heart failure, apparently superimposed on chronic interstitial disease. A degree of superimposed pneumonia cannot be excluded radiographically. More than one of these entities may exist concurrently. Stable cardiac silhouette. Aortic atherosclerosis.  Electronically Signed   By: Lowella Grip III M.D.   On: 03/21/2016 07:10    Time Spent in minutes  35   Louellen Molder M.D on 03/31/2016 at 2:30 PM  Between 7am to 7pm - Pager - [phone]  After 7pm go to [email or web address] - password [facility]  [facility] -  Office  [phone]

## 2016-03-31 NOTE — Progress Notes (Signed)
PT Cancellation Note  Patient Details Name: Vickie Murphy MRN: [id number] DOB: 08-23-1932   Cancelled Treatment:     pt unable to participate today per RN.  Will check back as schedule permits.   Nathanial Rancher 03/31/2016, 1:19 PM

## 2016-03-31 NOTE — Progress Notes (Signed)
Inpatient Diabetes Program Recommendations  AACE/ADA: New Consensus Statement on Inpatient Glycemic Control (2015)  Target Ranges:  Prepandial:   less than 140 mg/dL      Peak postprandial:   less than 180 mg/dL (1-2 hours)      Critically ill patients:  140 - 180 mg/dL   Results for ERIE, COORS (MRN [id number]) as of 03/31/2016 10:43  Ref. Range 03/30/2016 08:17 03/30/2016 12:24 03/30/2016 17:18 03/30/2016 21:12  Glucose-Capillary Latest Ref Range: 65 - 99 mg/dL 73 198 (H) 215 (H) 340 (H)   Results for ZANDREA, OLES (MRN [id number]) as of 03/31/2016 10:43  Ref. Range 03/31/2016 07:38  Glucose-Capillary Latest Ref Range: 65 - 99 mg/dL 168 (H)      Home DM Meds: Lantus 10 units daily                                                                       Novolog 2-15 units tid per SSI                             Metformin 500 mg bid  Current Insulin Orders: Lantus 12 units QHS                                       Novolog Sensitive Correction Scale/ SSI (0-9 units) TID AC      Novolog 3 units tidwc      MD- Please consider increasing Novolog SSI to Moderate Correction Scale/ SSI (0-15 units) TID AC + HS (currently ordered as Sensitive scale 0-9 units)      --Will follow patient during hospitalization--  Wyn Quaker RN, MSN, CDE Diabetes Coordinator Inpatient Glycemic Control Team Team Pager: [phone] (8a-5p)

## 2016-03-31 NOTE — Care Management Important Message (Signed)
Important Message  Patient Details  Name: Lorece Gargus MRN: [id number] Date of Birth: 1933-08-01   Medicare Important Message Given:  Yes    Camillo Flaming 03/31/2016, 8:58 AMImportant Message  Patient Details  Name: Mistey Fimbres MRN: [id number] Date of Birth: Aug 29, 1932   Medicare Important Message Given:  Yes    Camillo Flaming 03/31/2016, 8:58 AM

## 2016-03-31 NOTE — Progress Notes (Addendum)
Palliative consult request received Patient seen and examined Chart reviewed Discussed with Dr Clementeen Graham.   Patient on NRB mask, not able to participate.  Call placed and message left for next of kin ex husband Janey Greaser at [phone]. Await call back to schedule family meeting for goals of care discussions.  Thank you for the consult Full note and recommendations to follow  Loistine Chance MD [facility] health palliative medicine team.  [phone]  -------------  Addendum 03/31/16 1700  Received call back from Mr Merlene Laughter Family meeting scheduled for 04-01-16 at 0930 for goals of care discussions.

## 2016-03-31 NOTE — Progress Notes (Signed)
SLP Cancellation Note  Patient Details Name: Takeria Kalfas MRN: [id number] DOB: [birth year]   Cancelled treatment:       Reason Eval/Treat Not Completed: Fatigue/lethargy limiting ability to participate (pt sleeping on 50% ventimask and sounds congested, did not provide po due to aspiration risk with mental and respiratory status)   [name], [place] [facility] [place] [phone]

## 2016-03-31 NOTE — Progress Notes (Signed)
CSW confirmed with Lexine Baton at [facility] that they would be able to take patient back over the weekend if ready.   Please call weekend CSW (ph#: [phone]) to facilitate discharge.      Raynaldo Opitz, [facility] Clinical Social Worker cell #: [phone]

## 2016-04-01 DIAGNOSIS — R627 Adult failure to thrive: Secondary | ICD-10-CM

## 2016-04-01 DIAGNOSIS — N390 Urinary tract infection, site not specified: Secondary | ICD-10-CM | POA: Diagnosis present

## 2016-04-01 LAB — COMPREHENSIVE METABOLIC PANEL
ALK PHOS: 100 U/L (ref 38–126)
ALT: 74 U/L — AB (ref 14–54)
AST: 34 U/L (ref 15–41)
Albumin: 1.9 g/dL — ABNORMAL LOW (ref 3.5–5.0)
Anion gap: 6 (ref 5–15)
BUN: 35 mg/dL — ABNORMAL HIGH (ref 6–20)
CO2: 31 mmol/L (ref 22–32)
Calcium: 7.7 mg/dL — ABNORMAL LOW (ref 8.9–10.3)
Chloride: 99 mmol/L — ABNORMAL LOW (ref 101–111)
Creatinine, Ser: 1.24 mg/dL — ABNORMAL HIGH (ref 0.44–1.00)
GFR, EST AFRICAN AMERICAN: 46 mL/min — AB (ref >60)
GFR, EST NON AFRICAN AMERICAN: 40 mL/min — AB (ref >60)
GLUCOSE: 123 mg/dL — AB (ref 65–99)
Potassium: 3.5 mmol/L (ref 3.5–5.1)
SODIUM: 136 mmol/L (ref 135–145)
TOTAL PROTEIN: 6.1 g/dL — AB (ref 6.5–8.1)
Total Bilirubin: 1.5 mg/dL — ABNORMAL HIGH (ref 0.3–1.2)

## 2016-04-01 LAB — GLUCOSE, CAPILLARY
GLUCOSE-CAPILLARY: 141 mg/dL — AB (ref 65–99)
Glucose-Capillary: 106 mg/dL — ABNORMAL HIGH (ref 65–99)
Glucose-Capillary: 96 mg/dL (ref 65–99)
Glucose-Capillary: 89 mg/dL (ref 65–99)

## 2016-04-01 LAB — HEPATITIS PANEL, ACUTE
HCV Ab: <0.1 s/co ratio (ref 0.0–0.9)
Hep A IgM: NEGATIVE
Hep B C IgM: NEGATIVE
Hepatitis B Surface Ag: NEGATIVE

## 2016-04-01 LAB — CBC
HCT: 27.8 % — ABNORMAL LOW (ref 36.0–46.0)
HEMOGLOBIN: 8.6 g/dL — AB (ref 12.0–15.0)
MCH: 28.3 pg (ref 26.0–34.0)
MCHC: 30.9 g/dL (ref 30.0–36.0)
MCV: 91.4 fL (ref 78.0–100.0)
PLATELETS: 133 10*3/uL — AB (ref 150–400)
RBC: 3.04 MIL/uL — AB (ref 3.87–5.11)
RDW: 16.7 % — ABNORMAL HIGH (ref 11.5–15.5)
WBC: 15.2 10*3/uL — AB (ref 4.0–10.5)

## 2016-04-01 MED ORDER — FUROSEMIDE 10 MG/ML IJ SOLN
40.0000 mg | Freq: Every day | INTRAMUSCULAR | Status: DC
  Administered 2016-04-01: 40 mg via INTRAVENOUS
  Filled 2016-04-01: qty 4

## 2016-04-01 NOTE — Consult Note (Signed)
Consultation Note Date: 04/01/2016   Patient Name: Vickie Murphy  DOB: March 19, 1933  MRN: [id number]  Age / Sex: 82 y.o., female  PCP: Glendon Axe, MD Referring Physician: Louellen Molder, MD  Reason for Consultation: Establishing goals of care  HPI/Patient Profile: 82 y.o. female  admitted on 03/21/2016  Clinical Assessment and Goals of Care:  Patient is a 82 year old lady with a past medical history significant for mild-moderate degree of dementia, ongoing gradual cognitive, physical decline at play essentially since the last year-18 months. Give up driving one year ago. Has had recurrent hospitalizations for pneumonia at [facility]. Has been sent twice to [facility] for skilled nursing facility rehabilitation attempts in [place], [place]. Primary historian is healthcare power of attorney, ex-husband Mr. Rosamaria Lints has arrived for a family meeting. Patient moved back to the area from [facility] where she was living with her son. Now she is estranged from her son. Daughter [name] lives in [place]. Ex-husband lives in [place]. Patient was initially living at [facility] facility in [place], [place]. She has been evaluated for dementia, she has been evaluated for recurring urinary tract infections by urologist in [facility], she was supposed to be evaluated by cardiothoracic surgeons for consideration for valve replacement, however has had recurrent hospitalizations at [facility] regional for pneumonia.  Patient has been admitted to hospital medicine service at [facility] since 03-21-16. She has been admitted for pneumonia, GI bleed. She status post EGD and colonoscopy with no acute evidence of bleeding however hemoglobin was as low as 5 g. She has been given packed red blood cell transfusions. She has undergone evaluation by cardiology service because  of chest pain troponin elevations. Hospital course has been complicated by ongoing functional decline, high oxygen requirements. CT scan of the abdomen and pelvis shows acalculous cholecystitis versus liver inflammation. It also alluded to patchy groundglass opacities both lung bases. Suspicion language pathology has not been able to work with the patient because of her being on Ventimask, being congested and weak. For the last 24-48 hours, patient has shown essentially no interest in oral intake. She remains on Ventimask. She opens eyes to voice command but does not interact at all.  Discussed extensively with Mr. Delfino Lovett. Introduced myself and palliative medicine as follows: Palliative medicine is specialized medical care for people living with serious illness. It focuses on providing relief from the symptoms and stress of a serious illness. The goal is to improve quality of life for both the patient and the family.  Mr. Delfino Lovett is the conservator of her estate. He has Legrand Como power of attorney and financial power of attorney over her. He states that the patient has expressed several times that she would like to be cremated. Unfortunately, there are not of psychosocial dynamics. Patient is essentially estranged from both her children. Ms. Matthew Saras Mr. Delfino Lovett had been married for 40 years and lived in [place], [place]. Healthcare power of attorney states that he recognizes the patient  has ongoing decline of her trajectory and remains at high risk for decline decompensation and eventually death. Goals of care, wishes elicited. Introduced hospice as an extra layer of support at this point. Questions answered. See recommendations below. Thank you for the consult. We will continue to follow along.  HCPOA  Ex-husband Mr. Janey Greaser at [phone].  SUMMARY OF RECOMMENDATIONS    DO NOT RESUSCITATE/DO NOT INTUBATE Extensive discussions with ex-husband, healthcare power of attorney Mr. Delfino Lovett  [name]: Patient has several underlying acute and chronic comorbidities, high risk of ongoing decompensation/decline.  Possibly less than 2 weeks for prognosis given high O2 requirements, minimal-essentially nill oral intake.  Social work [place] for residential hospice-hospice home in [place], await recommendations from hospice liaison.  Code Status/Advance Care Planning:  DNR    Symptom Management:    Continue current management.  Palliative Prophylaxis:   Delirium Protocol  Additional Recommendations (Limitations, Scope, Preferences):  Full Comfort Care  Psycho-social/Spiritual:   Desire for further Chaplaincy support:no  Additional Recommendations: Education on Hospice  Prognosis:   < 2 weeks  Discharge Planning: Hospice facility Recommendations have been made in family meeting. Choices discussed. [facility].       Primary Diagnoses: Present on Admission: . Acute hypoxemic respiratory failure ([place]) . CAP (community acquired pneumonia) . Hyperlipidemia . Hypertension . Diastolic dysfunction with acute on chronic heart failure ([place]) . Demand ischemia ([place])   I have reviewed the medical record, interviewed the patient and family, and examined the patient. The following aspects are pertinent.  Past Medical History:  Diagnosis Date  . Acid reflux   . Breast cancer ([place]) 2002   right  . Cancer [facility])    breast-s/p mastectomy and on chemo  . CHF (congestive heart failure) ([place])    pt reports hx of such  . Depression   . Diabetes mellitus without complication ([place])   . Hyperlipidemia   . Hypertension   . Recurrent UTI    Social History   Social History  . Marital status: Divorced    Spouse name: N/A  . Number of children: N/A  . Years of education: N/A   Social History Main Topics  . Smoking status: Never Smoker  . Smokeless tobacco: Never Used  . Alcohol use No  . Drug use: No  . Sexual activity: No   Other Topics  Concern  . None   Social History Narrative  . None   Family History  Problem Relation Age of Onset  . Cancer Father   . Drug abuse Brother   . Breast cancer Neg Hx    Scheduled Meds: . aspirin EC  81 mg Oral Daily  . cefTRIAXone (ROCEPHIN)  IV  1 g Intravenous Q24H  . diltiazem  120 mg Oral Daily  . feeding supplement  1 Container Oral TID BM  . feeding supplement (PRO-STAT SUGAR FREE 64)  30 mL Oral BID  . FLUoxetine  60 mg Oral Daily  . furosemide  20 mg Oral Daily  . insulin aspart  0-15 Units Subcutaneous TID WC  . insulin aspart  3 Units Subcutaneous TID WC  . insulin glargine  12 Units Subcutaneous QHS  . isosorbide mononitrate  15 mg Oral Daily  . metoprolol tartrate  25 mg Oral BID  . mirtazapine  15 mg Oral QHS  . multivitamin with minerals  1 tablet Oral Daily  . pantoprazole  40 mg Oral BID  . polyethylene glycol  17 g Oral BID  .  traZODone  100 mg Oral QHS   Continuous Infusions:  PRN Meds:.acetaminophen, albuterol, ALPRAZolam, diphenhydrAMINE, docusate sodium, gi cocktail, hydrALAZINE, HYDROcodone-acetaminophen, iopamidol, lip balm, morphine injection, ondansetron (ZOFRAN) IV, promethazine Medications Prior to Admission:  Prior to Admission medications   Medication Sig Start Date End Date Taking? Authorizing Provider  acetaminophen (TYLENOL) 325 MG tablet Take 650 mg by mouth every 6 (six) hours as needed for mild pain or fever.   Yes Historical Provider, MD  albuterol (PROVENTIL) (2.5 MG/3ML) 0.083% nebulizer solution Take 2.5 mg by nebulization every 6 (six) hours as needed for wheezing or shortness of breath.   Yes Historical Provider, MD  ALPRAZolam Duanne Moron) 0.5 MG tablet Take 0.5 mg by mouth 2 (two) times daily as needed for anxiety.   Yes Historical Provider, MD  ascorbic acid (VITAMIN C) 500 MG tablet Take 500 mg by mouth daily.   Yes Historical Provider, MD  aspirin EC 81 MG tablet Take 81 mg by mouth daily.   Yes Historical Provider, MD  atorvastatin  (LIPITOR) 10 MG tablet Take 10 mg by mouth every evening. At night   Yes Historical Provider, MD  Cholecalciferol (VITAMIN D3) 2000 units TABS Take 2,000 Units by mouth daily. At 6 am   Yes Historical Provider, MD  dextromethorphan-guaiFENesin [facility] DM) 30-600 MG 12hr tablet Take 1 tablet by mouth 2 (two) times daily.   Yes Historical Provider, MD  dextrose (GLUTOSE) 40 % GEL Take 37.5 g (15 g of dextrose total) by mouth every 15 minutes as needed for hypoglycemia   Yes Historical Provider, MD  diltiazem (CARDIZEM CD) 120 MG 24 hr capsule Take 120 mg by mouth daily.   Yes Historical Provider, MD  docusate sodium (COLACE) 100 MG capsule Take 100 mg by mouth 2 (two) times daily as needed for mild constipation.   Yes Historical Provider, MD  FLUoxetine (PROZAC) 40 MG capsule Take 60 mg by mouth daily.    Yes Historical Provider, MD  folic acid (FOLVITE) A999333 MCG tablet Take 400 mcg by mouth daily.   Yes Historical Provider, MD  gabapentin (NEURONTIN) 300 MG capsule Take 300 mg by mouth 3 (three) times daily.   Yes Historical Provider, MD  guaifenesin (ROBITUSSIN) 100 MG/5ML syrup Take 200 mg by mouth every 4 (four) hours as needed for cough.   Yes Historical Provider, MD  HYDROcodone-acetaminophen (NORCO/VICODIN) 5-325 MG tablet Take one tablet by mouth three times daily for pain Patient taking differently: Take 1 tablet by mouth 2 (two) times daily.  03/17/16  Yes Monica Carter, DO  insulin glargine (LANTUS) 100 UNIT/ML injection Inject 10 Units into the skin at bedtime.   Yes Historical Provider, MD  insulin lispro (HUMALOG) 100 UNIT/ML injection Inject 2-15 Units into the skin 4 (four) times daily -  with meals and at bedtime. PER SLIDING SCALE:  121-150= 2 units 151-200= 3 units 201-250= 5 units 251-300= 8 units 301-350= 11 units 351-400= 15 units 401-450= 18 units  > Greater than 450 CALL MD   Yes Historical Provider, MD  loperamide (IMODIUM) 2 MG capsule Take 2 mg by mouth 2 (two) times  daily.   Yes Historical Provider, MD  magnesium oxide (MAG-OX) 400 MG tablet Take 400 mg by mouth daily.    Yes Historical Provider, MD  meclizine (ANTIVERT) 12.5 MG tablet Take 12.5 mg by mouth 3 (three) times daily as needed for dizziness.    Yes Historical Provider, MD  metFORMIN (GLUCOPHAGE) 500 MG tablet Take 500 mg by mouth 2 (two)  times daily with a meal.   Yes Historical Provider, MD  mirtazapine (REMERON) 15 MG tablet Take 15 mg by mouth at bedtime.   Yes Historical Provider, MD  moxifloxacin (AVELOX) 400 MG tablet Take 400 mg by mouth daily at 8 pm.   Yes Historical Provider, MD  Multiple Vitamin (TAB-A-VITE PO) Take 1 tablet by mouth daily.   Yes Historical Provider, MD  omeprazole (PRILOSEC) 40 MG capsule Take 40 mg by mouth daily.   Yes Historical Provider, MD  promethazine (PHENERGAN) 25 MG tablet Take 25 mg by mouth every 6 (six) hours as needed for nausea.   Yes Historical Provider, MD  traZODone (DESYREL) 100 MG tablet Take 100 mg by mouth at bedtime.   Yes Historical Provider, MD   No Known Allergies Review of Systems Does not open eyes, does not follow commands Physical Exam Weak appearing elderly lady resting in bed Has Ventimask on Shallow breathing, congested breath sounds anterior lung fields Abdomen soft mild distention Extremities warm to touch, no coolness, no mottling S1-S2  Vital Signs: BP 125/61 (BP Location: Left Arm)   Pulse 90   Temp 97.4 F (36.3 C) (Oral)   Resp 18   Ht 5\' 3"  (1.6 m)   Wt 62.7 kg (138 lb 4.8 oz)   SpO2 95%   BMI 24.50 kg/m  Pain Assessment: No/denies pain   Pain Score: Asleep   SpO2: SpO2: 95 % O2 Device:SpO2: 95 % O2 Flow Rate: .O2 Flow Rate (L/min): 3 L/min  IO: Intake/output summary:  Intake/Output Summary (Last 24 hours) at 04/01/16 1042 Last data filed at 04/01/16 0600  Gross per 24 hour  Intake              135 ml  Output                0 ml  Net              135 ml    LBM: Last BM Date: 04/01/16 Baseline  Weight: Weight: 63.6 kg (140 lb 4.8 oz) Most recent weight: Weight: 62.7 kg (138 lb 4.8 oz)     Palliative Assessment/Data:   Flowsheet Rows   Flowsheet Row Most Recent Value  Intake Tab  Referral Department  Hospitalist  Unit at Time of Referral  Med/Surg Unit  Palliative Care Primary Diagnosis  Other (Comment)  Date Notified  03/31/16  Palliative Care Type  New Palliative care  Reason for referral  Clarify Goals of Care  Date of Admission  03/21/16  Date first seen by Palliative Care  03/31/16  # of days Palliative referral response time  0 Day(s)  # of days IP prior to Palliative referral  10  Clinical Assessment  Palliative Performance Scale Score  20%  Pain Max last 24 hours  4  Pain Min Last 24 hours  3  Dyspnea Max Last 24 Hours  4  Dyspnea Min Last 24 hours  3  Nausea Max Last 24 Hours  4  Nausea Min Last 24 Hours  3  Psychosocial & Spiritual Assessment  Palliative Care Outcomes  Patient/Family meeting held?  Yes  Who was at the meeting?  ex husband hcpoa richard biddy   Palliative Care Outcomes  Clarified goals of care  Palliative Care follow-up planned  Yes, Facility      Time In: 9 Time Out: 10.30 Time Total: 90 min  Greater than 50%  of this time was spent counseling and coordinating care related to the above  assessment and plan.  Signed by: Loistine Chance, MD  [phone]  Please contact Palliative Medicine Team phone at [phone] for questions and concerns.  For individual provider: See Shea Evans

## 2016-04-01 NOTE — Progress Notes (Signed)
SLP Cancellation Note  Patient Details Name: Vickie Murphy MRN: [id number] DOB: June 20, 1933   Cancelled treatment:       Reason Eval/Treat Not Completed: Fatigue/lethargy limiting ability to participate (per review of chart, pt hospice appropriate, no family/hcpoa present at this time, will s/o; please reorder if indicated)  [name], [facility] SLP [phone]  04/01/2016, 3:12 PM

## 2016-04-01 NOTE — Progress Notes (Signed)
PROGRESS NOTE                                                                                                                                                                                                             Patient Demographics:    Vickie Murphy, is a 82 y.o. female, DOB - 17-Nov-1932, ZOX:[id number]  Admit date - 03/21/2016   Admitting Physician Thurnell Lose, MD  Outpatient Primary MD for the patient is Glendon Axe, MD  LOS - 11  Outpatient Specialists: none  Chief Complaint  Patient presents with  . Abnormal Lab       Brief Narrative   82 year old female with dementia, CHF, diabetes mellitus, history of breast cancer, depression, dyslipidemia admitted with shortness of breath and found to have pneumonia. Patient treated with IV antibiotic. 2-D echo done was suggestive of CHF. During hospital course patient was found to have acute blood loss anemia and underwent EGD and colonoscopy without findings of active source of bleeding. On 8/21 patient complained of chest pain and was found to have elevated troponin. As it CT angiogram of the chest was done which was negative for PE. Cardiology consulted for further management.    Subjective:   Patient in some discomfort with dyspnea. Remains afebrile.   Assessment  & Plan :    Principal Problem:   Acute hypoxemic respiratory failure (HCC) Suspected due to multilobar pneumonia. Completed antibiotic course. Blood cultures negative.    Active Problems:  Sepsis Suspect due to UTI and acute hepatitis versus a cath was cholecystitis. Now resolved. Blood cultures negative for growth. Started on empiric Rocephin and for UTI. Follow cultures. Lactic acid normalized. Leukocytosis improving.   Abdominal pain with transaminitis Suspect abdominal pain due to UTI. Transaminitis possibly due to acute hepatitis/ hepatic congestion.   Ultrasound abdomen shows hepatic  cirrhosis, without cholecystitis. LFTs trending down. CT abdomen showing biliary distention without cholelithiasis and mild swelling of the liver.   NSTEMI Troponin peaked to 1.34. No further chest symptoms. CT angiogram of the chest negative for PE but showing heavy calcification of all 3 coronaries. Per cardiology she is a poor candidate for cardiac catheterization given her dementia and recent severe blood loss anemia. Continue aspirin, beta blocker. Added Imdur. Hold statin given transaminitis.     Acute on chronic diastolic CHF EF of 81-19%. Resume  Lasix as patient appears congestion.  Acute blood loss anemia.  Hemoglobin as low as 5.1. Underwent EGD and colonoscopy without findings of acute source of bleeding. H&H now stable posttransfusion..  Thrombocytopenia Mild drop.   diabetes mellitus without complication (HCC)  sliding-scale coverage   Hypokalemia Replenished  Severe deconditioning and FTT Palliative care met with patient's husband . Given worsening  dyspnea requiring face mask,worsening dementia, sepsis and poor overall prognosis husband wished patient to be made comfort and wished [facility] to be [facility]. SW has contacted the hospice and awaiting call back  Prn IV lasix for dyspnea and comfort. Minimize medications.      Code Status  DO NOT RESUSCITATE  Family Communication ex-husband at bedside  Disposition Plan  : residential hospice [facility]  For Discharge :    Consult: Cardiology Palliative care  Procedures  :   2-D echo CT angiogram of the chest CT abdomen Ultrasound abdomen  DVT Prophylaxis  :   SCDs   Lab Results  Component Value Date   PLT 133 (L) 04/01/2016    Antibiotics  :    Anti-infectives    Start     Dose/Rate Route Frequency Ordered Stop   03/30/16 1600  cefTRIAXone (ROCEPHIN) 1 g in dextrose 5 % 50 mL IVPB     1 g 100 mL/hr over 30 Minutes Intravenous Every 24 hours 03/30/16 1450     03/28/16 1800   levofloxacin (LEVAQUIN) tablet 500 mg     500 mg Oral Every evening 03/27/16 1828 03/28/16 1854   03/24/16 1800  levofloxacin (LEVAQUIN) tablet 500 mg  Status:  Discontinued     500 mg Oral Every evening 03/24/16 1610 03/27/16 1828   03/22/16 1000  vancomycin (VANCOCIN) IVPB 750 mg/150 ml premix  Status:  Discontinued     750 mg 150 mL/hr over 60 Minutes Intravenous Every 24 hours 03/21/16 0929 03/24/16 1610   03/21/16 1600  piperacillin-tazobactam (ZOSYN) IVPB 3.375 g  Status:  Discontinued     3.375 g 12.5 mL/hr over 240 Minutes Intravenous Every 8 hours 03/21/16 0930 03/24/16 1610   03/21/16 0900  piperacillin-tazobactam (ZOSYN) IVPB 3.375 g     3.375 g 100 mL/hr over 30 Minutes Intravenous  Once 03/21/16 0846 03/21/16 1200   03/21/16 0900  vancomycin (VANCOCIN) IVPB 1000 mg/200 mL premix     1,000 mg 200 mL/hr over 60 Minutes Intravenous  Once 03/21/16 0847 03/21/16 1230        Objective:   Vitals:   03/31/16 2138 03/31/16 2312 04/01/16 0133 04/01/16 0553  BP: (!) 115/53 (!) 99/42 (!) 95/41 125/61  Pulse: (!) 106  87 90  Resp:    18  Temp: 98.4 F (36.9 C)   97.4 F (36.3 C)  TempSrc: Oral   Oral  SpO2: 95%  98% 95%  Weight:    62.7 kg (138 lb 4.8 oz)  Height:        Wt Readings from Last 3 Encounters:  04/01/16 62.7 kg (138 lb 4.8 oz)  03/15/16 59.6 kg (131 lb 6.4 oz)  02/23/16 62.9 kg (138 lb 9.6 oz)     Intake/Output Summary (Last 24 hours) at 04/01/16 1212 Last data filed at 04/01/16 0600  Gross per 24 hour  Intake              135 ml  Output                0 ml  Net  135 ml     Physical Exam  Gen: moaning in discomfort HEENT moist mucosa, supple neck Chest: bibasilar crackles CVS: N S1&S2, no murmurs,  GI: soft, ND, bowel sounds present, lower abdominal tenderness, Musculoskeletal: warm, trace edema CNS: AAOX1-, non focal    Data Review:    CBC  Recent Labs Lab 03/28/16 1307 03/29/16 0550 03/30/16 0546 03/31/16 0523  04/01/16 0350  WBC 10.9* 15.9* 23.2* 18.8* 15.2*  HGB 10.8* 11.4* 9.7* 9.7* 8.6*  HCT 34.4* 35.6* 30.5* 30.9* 27.8*  PLT 143* 76* 111* 131* 133*  MCV 94.8 92.5 93.0 93.6 91.4  MCH 29.8 29.6 29.6 29.4 28.3  MCHC 31.4 32.0 31.8 31.4 30.9  RDW 16.4* 16.4* 16.7* 16.9* 16.7*  LYMPHSABS 1.1 0.5*  --   --   --   MONOABS 0.9 1.1*  --   --   --   EOSABS 0.2 0.5  --   --   --   BASOSABS 0.0 0.0  --   --   --     Chemistries   Recent Labs Lab 03/28/16 1307 03/29/16 0550 03/30/16 0546 03/30/16 1903 03/31/16 0523 04/01/16 0350  NA 137 135 134*  --  133* 136  K 4.1 3.4* 4.2  --  3.8 3.5  CL 98* 94* 96*  --  96* 99*  CO2 32 31 33*  --  28 31  GLUCOSE 229* 191* 94  --  189* 123*  BUN 17 15 24*  --  36* 35*  CREATININE 0.95 0.65 1.23*  --  1.31* 1.24*  CALCIUM 8.5* 8.5* 7.9*  --  7.7* 7.7*  MG  --  1.6*  --  2.1  --   --   AST 82* 57* 46*  --  47* 34  ALT 301* 235* 140*  --  112* 74*  ALKPHOS 117 103 79  --  109 100  BILITOT 1.3* 1.4* 1.7*  --  1.5* 1.5*   ------------------------------------------------------------------------------------------------------------------ No results for input(s): CHOL, HDL, LDLCALC, TRIG, CHOLHDL, LDLDIRECT in the last 72 hours.  Lab Results  Component Value Date   HGBA1C 7.2 (H) 03/21/2016   ------------------------------------------------------------------------------------------------------------------ No results for input(s): TSH, T4TOTAL, T3FREE, THYROIDAB in the last 72 hours.  Invalid input(s): FREET3 ------------------------------------------------------------------------------------------------------------------ No results for input(s): VITAMINB12, FOLATE, FERRITIN, TIBC, IRON, RETICCTPCT in the last 72 hours.  Coagulation profile  Recent Labs Lab 03/29/16 0701  INR 1.24    No results for input(s): DDIMER in the last 72 hours.  Cardiac Enzymes  Recent Labs Lab 03/28/16 0059 03/28/16 0557 03/28/16 1307  TROPONINI 1.34* 1.04*  1.27*   ------------------------------------------------------------------------------------------------------------------    Component Value Date/Time   BNP 940.4 (H) 03/28/2016 1308    Inpatient Medications  Scheduled Meds: . aspirin EC  81 mg Oral Daily  . cefTRIAXone (ROCEPHIN)  IV  1 g Intravenous Q24H  . diltiazem  120 mg Oral Daily  . feeding supplement  1 Container Oral TID BM  . feeding supplement (PRO-STAT SUGAR FREE 64)  30 mL Oral BID  . FLUoxetine  60 mg Oral Daily  . furosemide  20 mg Oral Daily  . insulin aspart  0-15 Units Subcutaneous TID WC  . insulin aspart  3 Units Subcutaneous TID WC  . insulin glargine  12 Units Subcutaneous QHS  . isosorbide mononitrate  15 mg Oral Daily  . metoprolol tartrate  25 mg Oral BID  . mirtazapine  15 mg Oral QHS  . multivitamin with minerals  1 tablet Oral Daily  .  pantoprazole  40 mg Oral BID  . polyethylene glycol  17 g Oral BID  . traZODone  100 mg Oral QHS   Continuous Infusions:  PRN Meds:.acetaminophen, albuterol, ALPRAZolam, diphenhydrAMINE, docusate sodium, gi cocktail, hydrALAZINE, HYDROcodone-acetaminophen, iopamidol, lip balm, morphine injection, ondansetron (ZOFRAN) IV, promethazine  Micro Results Recent Results (from the past 240 hour(s))  Culture, blood (routine x 2)     Status: None (Preliminary result)   Collection Time: 03/30/16 10:24 AM  Result Value Ref Range Status   Specimen Description BLOOD LEFT ARM  Final   Special Requests IN PEDIATRIC BOTTLE [place]  Final   Culture   Final    NO GROWTH 1 DAY Performed at [facility]    Report Status PENDING  Incomplete  Culture, blood (routine x 2)     Status: None (Preliminary result)   Collection Time: 03/30/16 10:24 AM  Result Value Ref Range Status   Specimen Description BLOOD LEFT ARM  Final   Special Requests IN PEDIATRIC BOTTLE [place]  Final   Culture   Final    NO GROWTH 1 DAY Performed at [facility]    Report Status PENDING  Incomplete   Culture, Urine     Status: Abnormal   Collection Time: 03/30/16  1:10 PM  Result Value Ref Range Status   Specimen Description URINE, CLEAN CATCH  Final   Special Requests NONE  Final   Culture (A)  Final    >=100,000 COLONIES/mL DIPHTHEROIDS(CORYNEBACTERIUM SPECIES) Standardized susceptibility testing for this organism is not available. Performed at [facility]    Report Status 03/31/2016 FINAL  Final    Radiology Reports Dg Chest 1 View  Result Date: 03/27/2016 CLINICAL DATA:  History of breast carcinoma. Areas of lung infiltrate EXAM: CHEST 1 VIEW COMPARISON:  Chest radiograph March 21, 2016 and chest CT March 22, 2016 FINDINGS: There is persistent interstitial and patchy alveolar opacity bilaterally. Heart is borderline enlarged with the pulmonary vascularity within normal limits. There is aortic atherosclerosis. There is calcification in the mitral annulus. Patient is status post aortic valve replacement. Adenopathy is better seen on recent chest CT than current radiographic examination. IMPRESSION: Stable appearance of the lungs. Suspect a combination of interstitial and alveolar edema with likely superimposed pneumonia. Stable cardiac silhouette. Adenopathy is better seen on CT than current radiographic examination. Electronically Signed   By: Lowella Grip III M.D.   On: 03/27/2016 12:51   Dg Abd 1 View  Result Date: 03/23/2016 CLINICAL DATA:  Nasogastric tube placement EXAM: ABDOMEN - 1 VIEW COMPARISON:  Portable exam 2112 hours compared to 03/21/2016 CT abdomen and pelvis FINDINGS: Nasogastric tube traverses stomach with tip projecting over the proximal third portion of the duodenum. Bowel gas pattern normal. Degenerative disc disease and scoliosis of the thoracolumbar spine with osseous demineralization. No urinary tract calcification. IMPRESSION: Tip of nasogastric tube traverses stomach and projects over expected position of the third portion of the duodenum ;  consider withdrawal 14 cm to place tip at the expected position of the gastric antrum. Findings called to [place] on [facility] on 03/23/2016 at 2156 hours. Electronically Signed   By: Lavonia Dana M.D.   On: 03/23/2016 21:57   Ct Chest Wo Contrast  Result Date: 03/22/2016 CLINICAL DATA:  Pneumonia  Increased nausea Chf, dementia Hx breast ca EXAM: CT CHEST WITHOUT CONTRAST TECHNIQUE: Multidetector CT imaging of the chest was performed following the standard protocol without IV contrast. COMPARISON:  Chest radiograph, 03/21/2016.  Chest CT, 11/10/2015.  FINDINGS: Neck base and axilla: There several prominent left neck base lymph nodes, largest measuring 9 mm in short axis. No pathologically enlarged lymph nodes in the neck base or in either axilla. No masses. Postsurgical changes are noted along the right axilla extending along the right pectoralis from previous right breast surgery. Cardiovascular: Heart is top-normal in size. There is dense calcification of the mitral valve annulus. There are dense coronary artery calcifications. Calcified plaque is noted along the aortic arch patch that calcified plaque is noted along the thoracic aorta. Calcified plaque extends into the origin of the innominate artery and left subclavian artery most significantly the left subclavian artery. Mediastinum/Nodes: There is mediastinal and right hilar adenopathy. A prevascular node near the AP window measures 13 mm in short axis. An upper first anterior mediastinal node measures 9 mm short axis. An azygos level right peritracheal to precarinal node measures 14 mm in short axis. Soft tissue surrounding the right hilum is presumed to be adenopathy, with discrete nodes not well-defined. The areas in the inferior right subcarinal right hilar node measuring 17 mm in short axis. Lungs/Pleura: Patchy confluent and ground-glass type airspace lung opacities seen throughout the right lung predominantly in the right upper lobe. There is a  geographic appearing ground-glass opacity are noted in the left upper lobe and, to lesser degree, left lower lobe. This is superimposed on changes of interstitial fibrosis, more evident on the right, which are stable from the prior study. There is a small right pleural effusion. No pneumothorax. Upper Abdomen: No acute findings.  No liver or adrenal masses. Musculoskeletal: Mild degenerative changes of the thoracic spine. No osteoblastic or osteolytic lesions. IMPRESSION: 1. Findings are most consistent with bilateral pneumonia with patchy airspace consolidation and more diffuse hazy ground-glass opacification throughout the right lung, predominantly in the right upper lobe, and to a lesser degree in the left upper lobe and only mildly in the left lower lobe. There is a small right pleural effusion. Associated mediastinal and right hilar adenopathy is noted presumed reactive. The adenopathy is similar to the previous chest CT and may be chronic. The lung opacities could reflect asymmetric edema as opposed to pneumonia. Electronically Signed   By: Lajean Manes M.D.   On: 03/22/2016 15:42   Ct Angio Chest Pe W Or Wo Contrast  Result Date: 03/28/2016 CLINICAL DATA:  Chest pain, recent pneumonia, history of breast cancer, CHF EXAM: CT ANGIOGRAPHY CHEST WITH CONTRAST TECHNIQUE: Multidetector CT imaging of the chest was performed using the standard protocol during bolus administration of intravenous contrast. Multiplanar CT image reconstructions and MIPs were obtained to evaluate the vascular anatomy. CONTRAST:  100 mL Isovue 370 IV COMPARISON:  CT chest dated 03/22/2016 and 11/10/2015. FINDINGS: Cardiovascular: Satisfactory opacification of the pulmonary arteries to the segmental level. No evidence of pulmonary embolism. Prosthetic aortic valve. Atherosclerotic calcifications of the aortic arch. Mitral valve annular calcifications. Cardiomegaly.  No pericardial effusion. Three vessel coronary atherosclerosis.  Mediastinum/Nodes: Chronic thoracic lymphadenopathy, including a dominant 1.7 cm short axis low right paratracheal node (series 4/ image 32) and a 1.6 cm right hilar node (series 4/ image 34), similar to 11/10/2015. Postsurgical changes in the left axilla. Visualized thyroid is unremarkable. Lungs/Pleura: Chronic interstitial lung disease with multifocal areas of bronchiectasis and scattered fibrosis, right upper lobe predominant, progressed since 11/10/2015. Superimposed right upper lobe pneumonia and/or mild interstitial edema is possible. Small right pleural effusion, increased. Trace left pleural effusion, new. No pneumothorax. Upper Abdomen: Visualized upper abdomen is notable for  trace perihepatic ascites. Musculoskeletal: Postsurgical changes related to prior right mastectomy. Degenerative changes of the visualized thoracolumbar spine. Median sternotomy. Review of the MIP images confirms the above findings. IMPRESSION: No evidence of pulmonary embolism. Chronic interstitial lung disease, right upper lobe predominant, progressed since 11/10/2015. Superimposed right upper lobe pneumonia and/or interstitial edema is possible. Small right pleural effusion, increased. Trace left pleural effusion, new. Status post right mastectomy and right axillary lymph node dissection. No findings specific for metastatic disease. Chronic thoracic lymphadenopathy, as described above, possibly reactive. Electronically Signed   By: Julian Hy M.D.   On: 03/28/2016 18:56   US Abdomen Complete  Result Date: 03/30/2016 CLINICAL DATA:  Elevated liver function tests. Personal history of breast carcinoma. EXAM: ABDOMEN ULTRASOUND COMPLETE COMPARISON:  CT on 03/21/2016 FINDINGS: Gallbladder: No gallstones identified. Mild gallbladder wall thickening is seen measuring up to 5 mm. There is minimal pericholecystic fluid. No sonographic Murphy sign noted by sonographer. These findings are nonspecific. Common bile duct: Diameter: 5  mm, within normal limits Liver: Coarsened hepatic echotexture and capsular nodularity, highly suspicious for hepatic cirrhosis. No liver masses identified. Portal vein is patent. IVC: No abnormality visualized. Pancreas: Visualized portion unremarkable. Spleen: Size and appearance within normal limits. Right Kidney: Length: 9.6 cm. Echogenicity within normal limits. No mass or hydronephrosis visualized. Left Kidney: Length: 9.8 cm. Echogenicity within normal limits. No mass or hydronephrosis visualized. Abdominal aorta: No aneurysm visualized. Other findings: Tiny right pleural effusion noted. IMPRESSION: Findings suspicious for hepatic cirrhosis. No liver mass identified. Nonspecific mild gallbladder wall thickening and minimal pericholecystic fluid, which may be secondary to hepatic cirrhosis.No evidence of cholelithiasis or biliary dilatation. No sonographic Murphy's sign noted. Electronically Signed   By: Earle Gell M.D.   On: 03/30/2016 11:16   Ct Abdomen Pelvis W Contrast  Result Date: 03/30/2016 CLINICAL DATA:  Pt diagnosed with systemic inflammatory response syndrome.82 y.o. female admitted from [facility] with abdominal pain and nausea. She has PMH of mild dementia, severe weakness walks with walker at baseline, GERD, DM type II now insulin-dependent, chronic congestive heart failure unknown type no echo in chart, history of breast cancer, depression, dyslipidemia. She's been having issues with nausea for the last 3-4 months. EXAM: CT ABDOMEN AND PELVIS WITH CONTRAST TECHNIQUE: Multidetector CT imaging of the abdomen and pelvis was performed using the standard protocol following bolus administration of intravenous contrast. CONTRAST:  82m ISOVUE-300 IOPAMIDOL (ISOVUE-300) INJECTION 61% COMPARISON:  03/21/2016 FINDINGS: Lung bases: Coarse reticular opacities, patchy areas of consolidation well as more hazy ground-glass opacity and interstitial thickening is noted at the lung bases, right greater than left.  There is a small right pleural effusion. Heart is mildly enlarged. There stable changes from an aortic valve replacement. Hepatobiliary: Mild heterogeneous attenuation. There is enhancement adjacent to the gallbladder. No discrete liver mass. Liver is normal in size. Gallbladder is dilated. Enhancement is seen within the liver adjacent to the gallbladder that indents the inferior margin of the right lobe. There is also hazy inflammatory type change in the right upper quadrant. No bile duct dilation. No convincing gallstones. Spleen, pancreas, adrenal glands:  Unremarkable. Kidneys, ureters, bladder: Mild renal cortical thinning. Focus of renal scarring along the lateral aspect of the upper pole the right kidney. No renal masses or stones. No hydronephrosis. Normal ureters. Bladder is unremarkable. Uterus and adnexa:  Uterus surgically absent.  No adnexal masses. Lymph nodes: Several prominent nodes noted along the gastrohepatic ligament none pathologically enlarged. Ascites: Small amount ascites is seen adjacent to  the liver and collecting in the posterior pelvic recess. Gastrointestinal: There are scattered colonic diverticula without evidence of diverticulitis. No bowel wall thickening or inflammatory changes. Appendix not visualized. Musculoskeletal: There is a prominent scoliosis, curved to the left in the lumbar spine. Advanced degenerative changes are noted throughout the lumbar spine. No osteoblastic or osteolytic lesions. Vascular: Atherosclerotic calcifications are noted throughout the abdominal aorta its branch vessels. No aneurysm. IMPRESSION: 1. There is heterogeneous attenuation of the liver as well as enhancement along the inferior aspect of the right lobe where the distended gallbladder indents the inferior margin of the liver. Inflammatory changes are noted in the right upper quadrant as well as a small amount ascites. Findings may reflect acute acalculous cholecystitis. Alternatively, findings may  be from liver inflammation. 2. Lung bases are similar to the prior chest CT. There is a combination of chronic interstitial fibrosis with additional areas of patchy ground-glass and more confluent airspace opacity. There is also a small right pleural effusion. Findings may reflect infection/ inflammation or be due to a pulmonary edema superimposed on chronic interstitial fibrosis. 3. No other evidence of an acute abnormality within the abdomen pelvis. Electronically Signed   By: Lajean Manes M.D.   On: 03/30/2016 21:02   Ct Abdomen Pelvis W Contrast  Result Date: 03/21/2016 CLINICAL DATA:  Low hemoglobin and weakness with nausea and history of breast carcinoma EXAM: CT ABDOMEN AND PELVIS WITH CONTRAST TECHNIQUE: Multidetector CT imaging of the abdomen and pelvis was performed using the standard protocol following bolus administration of intravenous contrast. CONTRAST:  133m ISOVUE-300 IOPAMIDOL (ISOVUE-300) INJECTION 61% COMPARISON:  None. FINDINGS: Lower chest: Small right-sided pleural effusion is noted. Diffuse fibrotic changes are noted in the bases bilaterally. Some superimposed ground-glass changes are noted which may represent acute infiltrate particularly in the right lower lobe. Coronary calcifications are again noted. Hepatobiliary: No masses or other significant abnormality. Pancreas: No mass, inflammatory changes, or other significant abnormality. Spleen: Within normal limits in size and appearance. Adrenals/Urinary Tract: No masses identified. No evidence of hydronephrosis. Stomach/Bowel: The appendix is not well visualized. Diverticular changes noted without evidence of diverticulitis. Mild fecal material throughout the colon is noted consistent with mild constipation. No obstructive changes are seen. Vascular/Lymphatic: Aortoiliac calcifications are noted without aneurysmal dilatation. No significant lymphadenopathy is noted. Reproductive: No acute abnormality noted. Other: No free fluid is  noted. Musculoskeletal: Degenerative changes of the lumbar spine are noted. Mild scoliosis is seen. IMPRESSION: Small right-sided pleural effusion. Diffuse fibrotic changes are noted in the bases with some mild superimposed ground-glass infiltrate particularly in the right lower lobe. Chronic changes as described above. Electronically Signed   By: MInez CatalinaM.D.   On: 03/21/2016 10:19   Dg Chest Port 1 View  Result Date: 03/30/2016 CLINICAL DATA:  Elevated white blood cell count EXAM: PORTABLE CHEST 1 VIEW COMPARISON:  CT abdomen pelvis of 03/28/2016, and chest x-ray of 03/27/2016 FINDINGS: Lungs are less well aerated with volume loss at the bases. Also there appears to be a small left pleural effusion with airspace disease in the right mid lung and to lesser degree left mid lung as well. Mild cardiomegaly is stable. Heavily calcified mitral annulus is noted. Median sternotomy sutures are present IMPRESSION: Diminished aeration with airspace disease in both mid lungs and probable left pleural effusion. Electronically Signed   By: PIvar DrapeM.D.   On: 03/30/2016 08:08   Dg Chest Port 1 View  Result Date: 03/21/2016 CLINICAL DATA:  Shortness of breath.  History of breast carcinoma EXAM: PORTABLE CHEST 1 VIEW COMPARISON:  February 16, 2016 chest radiograph and chest CT November 10, 2015 FINDINGS: There is widespread interstitial edema with patchy alveolar opacity throughout the left mid lung region as well as much the right lung. Heart is borderline enlarged with pulmonary venous hypertension. There is calcification of the mitral annulus. Patient is status post median sternotomy. Adenopathy noted on previous chest CT is less well appreciated by radiography. No bone lesions are evident. Patient is status post aortic valve replacement. Atherosclerotic calcification is noted in the aorta. IMPRESSION: Findings felt to be most consistent with a degree of congestive heart failure, apparently superimposed on chronic  interstitial disease. A degree of superimposed pneumonia cannot be excluded radiographically. More than one of these entities may exist concurrently. Stable cardiac silhouette. Aortic atherosclerosis. Electronically Signed   By: Lowella Grip III M.D.   On: 03/21/2016 07:10    Time Spent in minutes  25   Louellen Molder M.D on 04/01/2016 at 12:12 PM  Between 7am to 7pm - Pager - [phone]  After 7pm go to [email or web address] - password [facility]  [facility] -  Office  [phone]

## 2016-04-01 NOTE — Progress Notes (Signed)
CSW consulted to assist with residential hospice home placement. Palliative Care Team has recommended hospice home placement. Pt's ex-husband / Loraine Grip [place] [phone] contacted. Mr. Merlene Laughter has requested [facility]. CSW has contacted hospice home and requested assistance with placement. Awaiting return call. CSW will continue to follow to assist with d/c planning  Werner Lean LCSW [phone]

## 2016-04-01 NOTE — Progress Notes (Signed)
CSW spoke with liaison from [place] at [facility] and provided referral. Liaison will contact CSW once a decision has been made regarding placement.  Werner Lean LCSW [phone]

## 2016-04-02 ENCOUNTER — Encounter (HOSPITAL_COMMUNITY): Payer: Self-pay | Admitting: Internal Medicine

## 2016-04-02 DIAGNOSIS — J690 Pneumonitis due to inhalation of food and vomit: Secondary | ICD-10-CM

## 2016-04-02 DIAGNOSIS — D649 Anemia, unspecified: Secondary | ICD-10-CM | POA: Diagnosis present

## 2016-04-02 DIAGNOSIS — F0390 Unspecified dementia without behavioral disturbance: Secondary | ICD-10-CM | POA: Diagnosis present

## 2016-04-02 DIAGNOSIS — I214 Non-ST elevation (NSTEMI) myocardial infarction: Secondary | ICD-10-CM | POA: Diagnosis not present

## 2016-04-02 HISTORY — DX: Pneumonitis due to inhalation of food and vomit: J69.0

## 2016-04-02 LAB — GLUCOSE, CAPILLARY
GLUCOSE-CAPILLARY: 98 mg/dL (ref 65–99)
Glucose-Capillary: 120 mg/dL — ABNORMAL HIGH (ref 65–99)

## 2016-04-02 MED ORDER — ALPRAZOLAM 0.5 MG PO TABS: 0.5000 mg | ORAL_TABLET | Freq: Two times a day (BID) | ORAL | 0 refills | Status: AC | PRN

## 2016-04-02 MED ORDER — METOPROLOL TARTRATE 25 MG PO TABS: 25.0000 mg | ORAL_TABLET | Freq: Two times a day (BID) | ORAL | 0 refills | Status: AC

## 2016-04-02 MED ORDER — PANTOPRAZOLE SODIUM 40 MG PO TBEC: 40.0000 mg | DELAYED_RELEASE_TABLET | Freq: Two times a day (BID) | ORAL | 0 refills | Status: AC

## 2016-04-02 MED ORDER — MORPHINE SULFATE (CONCENTRATE) 10 MG /0.5 ML PO SOLN: 5.0000 mg | ORAL | 0 refills | Status: AC | PRN

## 2016-04-02 MED ORDER — FUROSEMIDE 40 MG PO TABS: 40.0000 mg | ORAL_TABLET | Freq: Two times a day (BID) | ORAL | 0 refills | Status: AC | PRN

## 2016-04-02 MED ORDER — BOOST / RESOURCE BREEZE PO LIQD: 1.0000 | Freq: Three times a day (TID) | ORAL | 0 refills | Status: AC

## 2016-04-02 MED ORDER — PRO-STAT SUGAR FREE PO LIQD: 30.0000 mL | Freq: Two times a day (BID) | ORAL | 0 refills | Status: AC

## 2016-04-02 NOTE — Clinical Social Work Note (Addendum)
CSW received call from Webb Silversmith with [facility]. Cheri met with pt's POA regarding hospice services and decision made to transfer to [facility]. Cheri reports she will print d/c summary. [facility] to transport pt. CSW confirmed address of facility. CSW also spoke with POA, [name] and he confirms above information. Aware of transfer this afternoon. RN reports DNR is in packet.   Benay Pike, [place]

## 2016-04-02 NOTE — Progress Notes (Signed)
Daily Progress Note   Patient Name: Vickie Murphy       Date: 04/02/2016 DOB: February 09, 1933  Age: 82 y.o. MRN#: [id number] Attending Physician: Louellen Molder, MD Primary Care Physician: Glendon Axe, MD Admit Date: 03/21/2016  Reason for Consultation/Follow-up: Establishing goals of care  Subjective:  Appears uncomfortable, still on Ventimask, appears more congested. Moans, appears uncomfortable.  Length of Stay: 12  Current Medications: Scheduled Meds:  . aspirin EC  81 mg Oral Daily  . cefTRIAXone (ROCEPHIN)  IV  1 g Intravenous Q24H  . diltiazem  120 mg Oral Daily  . feeding supplement  1 Container Oral TID BM  . feeding supplement (PRO-STAT SUGAR FREE 64)  30 mL Oral BID  . FLUoxetine  60 mg Oral Daily  . furosemide  40 mg Intravenous Daily  . insulin aspart  0-15 Units Subcutaneous TID WC  . insulin aspart  3 Units Subcutaneous TID WC  . insulin glargine  12 Units Subcutaneous QHS  . isosorbide mononitrate  15 mg Oral Daily  . metoprolol tartrate  25 mg Oral BID  . mirtazapine  15 mg Oral QHS  . multivitamin with minerals  1 tablet Oral Daily  . pantoprazole  40 mg Oral BID  . polyethylene glycol  17 g Oral BID  . traZODone  100 mg Oral QHS    Continuous Infusions:    PRN Meds: acetaminophen, albuterol, ALPRAZolam, diphenhydrAMINE, docusate sodium, gi cocktail, hydrALAZINE, HYDROcodone-acetaminophen, iopamidol, lip balm, morphine injection, ondansetron (ZOFRAN) IV, promethazine  Physical Exam         Thin, weak appearing lady On nonrebreather mask Diffuse congested breath sounds anterior lung fields S1-S2 Abdomen soft Trace edema Awake, is not able to verbalize much Vital Signs: BP (!) 122/54 (BP Location: Left Wrist)   Pulse (!) 106   Temp 99.8 F (37.7 C)  (Tympanic)   Resp 18   Ht 5\' 3"  (1.6 m)   Wt 61.6 kg (135 lb 12.9 oz)   SpO2 100%   BMI 24.06 kg/m  SpO2: SpO2: 100 % O2 Device: O2 Device: Venturi Mask O2 Flow Rate: O2 Flow Rate (L/min): 3 L/min  Intake/output summary:  Intake/Output Summary (Last 24 hours) at 04/02/16 1116 Last data filed at 04/02/16 0600  Gross per 24 hour  Intake  750 ml  Output                0 ml  Net              750 ml   LBM: Last BM Date: 04/01/16 Baseline Weight: Weight: 63.6 kg (140 lb 4.8 oz) Most recent weight: Weight: 61.6 kg (135 lb 12.9 oz)       Palliative Assessment/Data:    Flowsheet Rows   Flowsheet Row Most Recent Value  Intake Tab  Referral Department  Hospitalist  Unit at Time of Referral  Med/Surg Unit  Palliative Care Primary Diagnosis  Other (Comment)  Date Notified  03/31/16  Palliative Care Type  Return patient Palliative Care  Reason for referral  Clarify Goals of Care, End of Life Care Assistance  Date of Admission  03/21/16  Date first seen by Palliative Care  03/31/16  # of days Palliative referral response time  0 Day(s)  # of days IP prior to Palliative referral  10  Clinical Assessment  Palliative Performance Scale Score  30%  Pain Max last 24 hours  4  Pain Min Last 24 hours  3  Dyspnea Max Last 24 Hours  4  Dyspnea Min Last 24 hours  3  Nausea Max Last 24 Hours  4  Nausea Min Last 24 Hours  3  Psychosocial & Spiritual Assessment  Palliative Care Outcomes  Patient/Family meeting held?  Yes  Who was at the meeting?  patient, bedside RN   Palliative Care Outcomes  Clarified goals of care  Palliative Care follow-up planned  Yes, Facility      Patient Active Problem List   Diagnosis Date Noted  . FTT (failure to thrive) in adult   . Urinary tract infectious disease   . Protein-calorie malnutrition, severe 03/30/2016  . Demand ischemia ([place])   . Elevated troponin 03/28/2016  . Chest pain 03/28/2016  . Diastolic dysfunction with acute on  chronic heart failure ([place]) 03/28/2016  . Cough 03/23/2016  . Acute hypoxemic respiratory failure ([place]) 03/21/2016  . CHF (congestive heart failure) ([place]) 03/21/2016  . Klebsiella cystitis 02/26/2016  . GERD (gastroesophageal reflux disease) 02/26/2016  . Neuropathy due to type 2 diabetes mellitus ([place]) 02/26/2016  . Sepsis ([place]) 12/26/2015  . CAP (community acquired pneumonia) 12/26/2015  . SOB (shortness of breath) 06/07/2015  . Ulcer of right groin ([place]) 06/02/2015  . Falls frequently 01/05/2015  . Dizziness 01/05/2015  . [place] intertrigo 01/05/2015  . Diabetes mellitus without complication ([place])   . Depression   . Hypertension   . Recurrent UTI   . Cancer of breast ([place])   . Hyperlipidemia   . Type 2 diabetes mellitus with circulatory disorder ([place]) 12/26/2014    Palliative Care Assessment & Plan   Patient Profile:    Assessment: Elderly lady with worsening dyspnea, PNA, recent GIB, worsening dementia, sepsis, poor overall prognosis.   Recommendations/Plan:   Recommend residential hospice. As discussed with ex-husband healthcare power of attorney, he had preferred [facility] [place]. Agree with when necessary IV Lasix for dyspnea and is a comfort management measure.  Goals of Care and Additional Recommendations:  Limitations on Scope of Treatment: Full Comfort Care  Code Status:    Code Status Orders        Start     Ordered   03/21/16 1027  Do not attempt resuscitation (DNR)  Continuous    Question Answer Comment  In the event of cardiac or respiratory ARREST Do  not call a "code blue"   In the event of cardiac or respiratory ARREST Do not perform Intubation, CPR, defibrillation or ACLS   In the event of cardiac or respiratory ARREST Use medication by any route, position, wound care, and other measures to relive pain and suffering. May use oxygen, suction and manual treatment of airway obstruction as needed for comfort.      03/21/16 1026    Code  Status History    Date Active Date Inactive Code Status Order ID Comments User Context   03/21/2016  8:12 AM 03/21/2016 10:26 AM DNR [id number]  Thurnell Lose, MD ED   03/21/2016  7:26 AM 03/21/2016  8:12 AM Full Code [id number]  Thurnell Lose, MD ED   01/05/2015  2:28 PM 03/21/2016  4:44 AM DNR [id number]  [name], MD Outpatient    Advance Directive Documentation   Flowsheet Row Most Recent Value  Type of Advance Directive  Out of facility DNR (pink MOST or yellow form)  Pre-existing out of facility DNR order (yellow form or pink MOST form)  No data  "MOST" Form in Place?  No data       Prognosis:   < 2 weeks  Discharge Planning:  Hospice facility  Care plan was discussed with  Patient's RN and Dr Clementeen Graham.   Thank you for allowing the Palliative Medicine Team to assist in the care of this patient.   Time In: 8 Time Out: 825 Total Time 25 Prolonged Time Billed  no       Greater than 50%  of this time was spent counseling and coordinating care related to the above assessment and plan.  Loistine Chance, MD [phone]  Please contact Palliative Medicine Team phone at [phone] for questions and concerns.

## 2016-04-02 NOTE — Consult Note (Signed)
[facility] [place] Met with pt and her ex husband HCPOA--  Discussed philosophy and the pt's condition while here. Discussed goals of care. She is full comfort care here. She has oxygen on and is doing a lot of grunting does stop grunting if you hold her hand. Pt family elected hospice care at [facility]. Spoke to [employer] who will arranged transport. RN on floor notified MD that the pt is in agreement to care and transfer at our facility. Webb Silversmith RN

## 2016-04-02 NOTE — Discharge Summary (Signed)
Physician Discharge Summary  Vickie Murphy HQI:[id number] DOB: 1933/06/16 DOA: 03/21/2016  PCP: Glendon Axe, MD  Admit date: 03/21/2016 Discharge date: 04/02/2016  Admitted From: Home Disposition:  [facility]  Recommendations for Outpatient Follow-up:  Discharge to residential hospice. Overall prognosis extremely guarded.   Discharge Condition: Guarded CODE STATUS: DO NOT RESUSCITATE Diet recommendation: Regular/comfort as tolerated    Discharge Diagnoses:  Principal Problem:    Acute hypoxemic respiratory failure ([place])   Active Problems:   Diabetes mellitus without complication (HCC)   Hypertension   Hyperlipidemia   Sepsis ([place])   CAP (community acquired pneumonia)   Elevated troponin   Chest pain   Diastolic dysfunction with acute on chronic heart failure (HCC)   Demand ischemia (HCC)   Protein-calorie malnutrition, severe   FTT (failure to thrive) in adult   Urinary tract infectious disease   Symptomatic anemia   Dementia without behavioral disturbance   NSTEMI (non-ST elevated myocardial infarction) ([place])   Aspiration pneumonia (HCC)  Brief narrative/history of present illness 82 year old female with dementia, CHF, diabetes mellitus, history of breast cancer, depression, dyslipidemia admitted with shortness of breath and found to have pneumonia. Patient treated with IV antibiotic. 2-D echo done was suggestive of CHF. During hospital course patient was found to have acute blood loss anemia and underwent EGD and colonoscopy without findings of active source of bleeding. On 8/21 patient complained of chest pain and was found to have elevated troponin. As it CT angiogram of the chest was done which was negative for PE. Cardiology consulted for further management.   Hospital course  Principal Problem:   Acute hypoxemic respiratory failure ([place]) Suspected due to multilobar pneumonia. Completed antibiotic course. Blood cultures negative.    Active  Problems:  Sepsis Suspect due to UTI and acute hepatitis versus Acalculous cholecystitis. Now resolved. Blood cultures negative for growth. Treated with empiric Rocephin for 5 days. Culture growing diphtheroids. Lactic acid normalized and leukocytosis improving.   Abdominal pain with transaminitis Suspect abdominal pain due to UTI. Transaminitis possibly due to acute hepatitis/ hepatic congestion.   Ultrasound abdomen shows hepatic cirrhosis, without cholecystitis. LFTs trending to normal.. CT abdomen showing biliary distention without cholelithiasis and mild swelling of the liver.   NSTEMI Troponin peaked to 1.34. No further chest symptoms. CT angiogram of the chest negative for PE but showing heavy calcification of all 3 coronaries. Per cardiology she is a poor candidate for cardiac catheterization given her dementia and recent severe blood loss anemia. Continue aspirin and beta blocker. Held Imdur due to low blood pressure. Discontinue statin due to hyperlipidemia.    Acute on chronic diastolic CHF EF of 13-24%. Lasix twice a day when necessary for congestion and shortness of breath.  Acute blood loss anemia.  Hemoglobin as low as 5.1. Underwent EGD and colonoscopy without findings of acute source of bleeding. H&H now stable posttransfusion..  Thrombocytopenia Mild drop possibly due to sepsis.  diabetes mellitus without complication ([place])   discharge on sliding-scale coverage   Hypokalemia Replenished  Severe deconditioning and FTT Palliative care met with patient's husband . Given worsening  dyspnea requiring face mask,worsening dementia, sepsis and poor overall prognosis husband wished patient to be made comfort and wished [facility] to [facility].  Patient will be discharged today. Prescribed Roxanol as needed for shortness of breath and comfort. Continue home dose Xanax. Oxygen for comfort.  Overall prognosis is extremely  guarded.       Family Communication ex-husband  at bedside  Disposition Plan  : residential hospice   Consult: Cardiology Palliative care  Procedures  :   2-D echo CT angiogram of the chest CT abdomen Ultrasound abdomen  Discharge Instructions     Medication List    STOP taking these medications   dextromethorphan-guaiFENesin 30-600 MG 12hr tablet Commonly known as:  MUCINEX DM   dextrose 40 % Gel Commonly known as:  GLUTOSE   diltiazem 120 MG 24 hr capsule Commonly known as:  CARDIZEM CD   FLUoxetine 40 MG capsule Commonly known as:  PROZAC   folic acid 025 MCG tablet Commonly known as:  FOLVITE   guaifenesin 100 MG/5ML syrup Commonly known as:  ROBITUSSIN   HYDROcodone-acetaminophen 5-325 MG tablet Commonly known as:  NORCO/VICODIN   insulin glargine 100 UNIT/ML injection Commonly known as:  LANTUS   loperamide 2 MG capsule Commonly known as:  IMODIUM   magnesium oxide 400 MG tablet Commonly known as:  MAG-OX   metFORMIN 500 MG tablet Commonly known as:  GLUCOPHAGE   mirtazapine 15 MG tablet Commonly known as:  REMERON   moxifloxacin 400 MG tablet Commonly known as:  AVELOX   omeprazole 40 MG capsule Commonly known as:  PRILOSEC   promethazine 25 MG tablet Commonly known as:  PHENERGAN   traZODone 100 MG tablet Commonly known as:  DESYREL atorvastatin 10 MG tablet Commonly known as:  LIPITOR     TAKE these medications   acetaminophen 325 MG tablet Commonly known as:  TYLENOL Take 650 mg by mouth every 6 (six) hours as needed for mild pain or fever.   albuterol (2.5 MG/3ML) 0.083% nebulizer solution Commonly known as:  PROVENTIL Take 2.5 mg by nebulization every 6 (six) hours as needed for wheezing or shortness of breath.   ALPRAZolam 0.5 MG tablet Commonly known as:  XANAX Take 1 tablet (0.5 mg total) by mouth 2 (two) times daily as needed for anxiety.   ascorbic acid 500 MG tablet Commonly known as:  VITAMIN  C Take 500 mg by mouth daily.   aspirin EC 81 MG tablet Take 81 mg by mouth daily.      docusate sodium 100 MG capsule Commonly known as:  COLACE Take 100 mg by mouth 2 (two) times daily as needed for mild constipation.   feeding supplement (PRO-STAT SUGAR FREE 64) Liqd Take 30 mLs by mouth 2 (two) times daily.   feeding supplement Liqd Take 1 Container by mouth 3 (three) times daily between meals.   furosemide 40 MG tablet Commonly known as:  LASIX Take 1 tablet (40 mg total) by mouth 2 (two) times daily as needed for fluid or edema (SHORTNESS OF BREATH).   gabapentin 300 MG capsule Commonly known as:  NEURONTIN Take 300 mg by mouth 3 (three) times daily.   insulin lispro 100 UNIT/ML injection Commonly known as:  HUMALOG Inject 2-15 Units into the skin 4 (four) times daily -  with meals and at bedtime. PER SLIDING SCALE:  121-150= 2 units 151-200= 3 units 201-250= 5 units 251-300= 8 units 301-350= 11 units 351-400= 15 units 401-450= 18 units  > Greater than 450 CALL MD   meclizine 12.5 MG tablet Commonly known as:  ANTIVERT Take 12.5 mg by mouth 3 (three) times daily as needed for dizziness.   metoprolol tartrate 25 MG tablet Commonly known as:  LOPRESSOR Take 1 tablet (25 mg total) by mouth 2 (two) times daily.   morphine CONCENTRATE 10 mg / 0.5 ml concentrated solution  Take 0.25 mLs (5 mg total) by mouth every 4 (four) hours as needed for severe pain, anxiety or shortness of breath.   pantoprazole 40 MG tablet Commonly known as:  PROTONIX Take 1 tablet (40 mg total) by mouth 2 (two) times daily.   TAB-A-VITE PO Take 1 tablet by mouth daily.   Vitamin D3 2000 units Tabs Take 2,000 Units by mouth daily. At 6 am      Follow-up Information    residential hospice .          No Known Allergies      Procedures/Studies: Dg Chest 1 View  Result Date: 03/27/2016 CLINICAL DATA:  History of breast carcinoma. Areas of lung infiltrate EXAM: CHEST 1 VIEW  COMPARISON:  Chest radiograph March 21, 2016 and chest CT March 22, 2016 FINDINGS: There is persistent interstitial and patchy alveolar opacity bilaterally. Heart is borderline enlarged with the pulmonary vascularity within normal limits. There is aortic atherosclerosis. There is calcification in the mitral annulus. Patient is status post aortic valve replacement. Adenopathy is better seen on recent chest CT than current radiographic examination. IMPRESSION: Stable appearance of the lungs. Suspect a combination of interstitial and alveolar edema with likely superimposed pneumonia. Stable cardiac silhouette. Adenopathy is better seen on CT than current radiographic examination. Electronically Signed   By: Lowella Grip III M.D.   On: 03/27/2016 12:51   Dg Abd 1 View  Result Date: 03/23/2016 CLINICAL DATA:  Nasogastric tube placement EXAM: ABDOMEN - 1 VIEW COMPARISON:  Portable exam 2112 hours compared to 03/21/2016 CT abdomen and pelvis FINDINGS: Nasogastric tube traverses stomach with tip projecting over the proximal third portion of the duodenum. Bowel gas pattern normal. Degenerative disc disease and scoliosis of the thoracolumbar spine with osseous demineralization. No urinary tract calcification. IMPRESSION: Tip of nasogastric tube traverses stomach and projects over expected position of the third portion of the duodenum ; consider withdrawal 14 cm to place tip at the expected position of the gastric antrum. Findings called to [place] on [facility] on 03/23/2016 at 2156 hours. Electronically Signed   By: Lavonia Dana M.D.   On: 03/23/2016 21:57   Ct Chest Wo Contrast  Result Date: 03/22/2016 CLINICAL DATA:  Pneumonia  Increased nausea Chf, dementia Hx breast ca EXAM: CT CHEST WITHOUT CONTRAST TECHNIQUE: Multidetector CT imaging of the chest was performed following the standard protocol without IV contrast. COMPARISON:  Chest radiograph, 03/21/2016.  Chest CT, 11/10/2015. FINDINGS: Neck base and  axilla: There several prominent left neck base lymph nodes, largest measuring 9 mm in short axis. No pathologically enlarged lymph nodes in the neck base or in either axilla. No masses. Postsurgical changes are noted along the right axilla extending along the right pectoralis from previous right breast surgery. Cardiovascular: Heart is top-normal in size. There is dense calcification of the mitral valve annulus. There are dense coronary artery calcifications. Calcified plaque is noted along the aortic arch patch that calcified plaque is noted along the thoracic aorta. Calcified plaque extends into the origin of the innominate artery and left subclavian artery most significantly the left subclavian artery. Mediastinum/Nodes: There is mediastinal and right hilar adenopathy. A prevascular node near the AP window measures 13 mm in short axis. An upper first anterior mediastinal node measures 9 mm short axis. An azygos level right peritracheal to precarinal node measures 14 mm in short axis. Soft tissue surrounding the right hilum is presumed to be adenopathy, with discrete nodes not well-defined. The areas in the inferior right  subcarinal right hilar node measuring 17 mm in short axis. Lungs/Pleura: Patchy confluent and ground-glass type airspace lung opacities seen throughout the right lung predominantly in the right upper lobe. There is a geographic appearing ground-glass opacity are noted in the left upper lobe and, to lesser degree, left lower lobe. This is superimposed on changes of interstitial fibrosis, more evident on the right, which are stable from the prior study. There is a small right pleural effusion. No pneumothorax. Upper Abdomen: No acute findings.  No liver or adrenal masses. Musculoskeletal: Mild degenerative changes of the thoracic spine. No osteoblastic or osteolytic lesions. IMPRESSION: 1. Findings are most consistent with bilateral pneumonia with patchy airspace consolidation and more diffuse hazy  ground-glass opacification throughout the right lung, predominantly in the right upper lobe, and to a lesser degree in the left upper lobe and only mildly in the left lower lobe. There is a small right pleural effusion. Associated mediastinal and right hilar adenopathy is noted presumed reactive. The adenopathy is similar to the previous chest CT and may be chronic. The lung opacities could reflect asymmetric edema as opposed to pneumonia. Electronically Signed   By: Lajean Manes M.D.   On: 03/22/2016 15:42   Ct Angio Chest Pe W Or Wo Contrast  Result Date: 03/28/2016 CLINICAL DATA:  Chest pain, recent pneumonia, history of breast cancer, CHF EXAM: CT ANGIOGRAPHY CHEST WITH CONTRAST TECHNIQUE: Multidetector CT imaging of the chest was performed using the standard protocol during bolus administration of intravenous contrast. Multiplanar CT image reconstructions and MIPs were obtained to evaluate the vascular anatomy. CONTRAST:  100 mL Isovue 370 IV COMPARISON:  CT chest dated 03/22/2016 and 11/10/2015. FINDINGS: Cardiovascular: Satisfactory opacification of the pulmonary arteries to the segmental level. No evidence of pulmonary embolism. Prosthetic aortic valve. Atherosclerotic calcifications of the aortic arch. Mitral valve annular calcifications. Cardiomegaly.  No pericardial effusion. Three vessel coronary atherosclerosis. Mediastinum/Nodes: Chronic thoracic lymphadenopathy, including a dominant 1.7 cm short axis low right paratracheal node (series 4/ image 32) and a 1.6 cm right hilar node (series 4/ image 34), similar to 11/10/2015. Postsurgical changes in the left axilla. Visualized thyroid is unremarkable. Lungs/Pleura: Chronic interstitial lung disease with multifocal areas of bronchiectasis and scattered fibrosis, right upper lobe predominant, progressed since 11/10/2015. Superimposed right upper lobe pneumonia and/or mild interstitial edema is possible. Small right pleural effusion, increased. Trace  left pleural effusion, new. No pneumothorax. Upper Abdomen: Visualized upper abdomen is notable for trace perihepatic ascites. Musculoskeletal: Postsurgical changes related to prior right mastectomy. Degenerative changes of the visualized thoracolumbar spine. Median sternotomy. Review of the MIP images confirms the above findings. IMPRESSION: No evidence of pulmonary embolism. Chronic interstitial lung disease, right upper lobe predominant, progressed since 11/10/2015. Superimposed right upper lobe pneumonia and/or interstitial edema is possible. Small right pleural effusion, increased. Trace left pleural effusion, new. Status post right mastectomy and right axillary lymph node dissection. No findings specific for metastatic disease. Chronic thoracic lymphadenopathy, as described above, possibly reactive. Electronically Signed   By: Julian Hy M.D.   On: 03/28/2016 18:56   US Abdomen Complete  Result Date: 03/30/2016 CLINICAL DATA:  Elevated liver function tests. Personal history of breast carcinoma. EXAM: ABDOMEN ULTRASOUND COMPLETE COMPARISON:  CT on 03/21/2016 FINDINGS: Gallbladder: No gallstones identified. Mild gallbladder wall thickening is seen measuring up to 5 mm. There is minimal pericholecystic fluid. No sonographic Murphy sign noted by sonographer. These findings are nonspecific. Common bile duct: Diameter: 5 mm, within normal limits Liver: Coarsened hepatic echotexture and  capsular nodularity, highly suspicious for hepatic cirrhosis. No liver masses identified. Portal vein is patent. IVC: No abnormality visualized. Pancreas: Visualized portion unremarkable. Spleen: Size and appearance within normal limits. Right Kidney: Length: 9.6 cm. Echogenicity within normal limits. No mass or hydronephrosis visualized. Left Kidney: Length: 9.8 cm. Echogenicity within normal limits. No mass or hydronephrosis visualized. Abdominal aorta: No aneurysm visualized. Other findings: Tiny right pleural effusion  noted. IMPRESSION: Findings suspicious for hepatic cirrhosis. No liver mass identified. Nonspecific mild gallbladder wall thickening and minimal pericholecystic fluid, which may be secondary to hepatic cirrhosis.No evidence of cholelithiasis or biliary dilatation. No sonographic Murphy's sign noted. Electronically Signed   By: Earle Gell M.D.   On: 03/30/2016 11:16   Ct Abdomen Pelvis W Contrast  Result Date: 03/30/2016 CLINICAL DATA:  Pt diagnosed with systemic inflammatory response syndrome.82 y.o. female admitted from [facility] with abdominal pain and nausea. She has PMH of mild dementia, severe weakness walks with walker at baseline, GERD, DM type II now insulin-dependent, chronic congestive heart failure unknown type no echo in chart, history of breast cancer, depression, dyslipidemia. She's been having issues with nausea for the last 3-4 months. EXAM: CT ABDOMEN AND PELVIS WITH CONTRAST TECHNIQUE: Multidetector CT imaging of the abdomen and pelvis was performed using the standard protocol following bolus administration of intravenous contrast. CONTRAST:  11m ISOVUE-300 IOPAMIDOL (ISOVUE-300) INJECTION 61% COMPARISON:  03/21/2016 FINDINGS: Lung bases: Coarse reticular opacities, patchy areas of consolidation well as more hazy ground-glass opacity and interstitial thickening is noted at the lung bases, right greater than left. There is a small right pleural effusion. Heart is mildly enlarged. There stable changes from an aortic valve replacement. Hepatobiliary: Mild heterogeneous attenuation. There is enhancement adjacent to the gallbladder. No discrete liver mass. Liver is normal in size. Gallbladder is dilated. Enhancement is seen within the liver adjacent to the gallbladder that indents the inferior margin of the right lobe. There is also hazy inflammatory type change in the right upper quadrant. No bile duct dilation. No convincing gallstones. Spleen, pancreas, adrenal glands:  Unremarkable. Kidneys,  ureters, bladder: Mild renal cortical thinning. Focus of renal scarring along the lateral aspect of the upper pole the right kidney. No renal masses or stones. No hydronephrosis. Normal ureters. Bladder is unremarkable. Uterus and adnexa:  Uterus surgically absent.  No adnexal masses. Lymph nodes: Several prominent nodes noted along the gastrohepatic ligament none pathologically enlarged. Ascites: Small amount ascites is seen adjacent to the liver and collecting in the posterior pelvic recess. Gastrointestinal: There are scattered colonic diverticula without evidence of diverticulitis. No bowel wall thickening or inflammatory changes. Appendix not visualized. Musculoskeletal: There is a prominent scoliosis, curved to the left in the lumbar spine. Advanced degenerative changes are noted throughout the lumbar spine. No osteoblastic or osteolytic lesions. Vascular: Atherosclerotic calcifications are noted throughout the abdominal aorta its branch vessels. No aneurysm. IMPRESSION: 1. There is heterogeneous attenuation of the liver as well as enhancement along the inferior aspect of the right lobe where the distended gallbladder indents the inferior margin of the liver. Inflammatory changes are noted in the right upper quadrant as well as a small amount ascites. Findings may reflect acute acalculous cholecystitis. Alternatively, findings may be from liver inflammation. 2. Lung bases are similar to the prior chest CT. There is a combination of chronic interstitial fibrosis with additional areas of patchy ground-glass and more confluent airspace opacity. There is also a small right pleural effusion. Findings may reflect infection/ inflammation or be due to a pulmonary  edema superimposed on chronic interstitial fibrosis. 3. No other evidence of an acute abnormality within the abdomen pelvis. Electronically Signed   By: Lajean Manes M.D.   On: 03/30/2016 21:02   Ct Abdomen Pelvis W Contrast  Result Date:  03/21/2016 CLINICAL DATA:  Low hemoglobin and weakness with nausea and history of breast carcinoma EXAM: CT ABDOMEN AND PELVIS WITH CONTRAST TECHNIQUE: Multidetector CT imaging of the abdomen and pelvis was performed using the standard protocol following bolus administration of intravenous contrast. CONTRAST:  15m ISOVUE-300 IOPAMIDOL (ISOVUE-300) INJECTION 61% COMPARISON:  None. FINDINGS: Lower chest: Small right-sided pleural effusion is noted. Diffuse fibrotic changes are noted in the bases bilaterally. Some superimposed ground-glass changes are noted which may represent acute infiltrate particularly in the right lower lobe. Coronary calcifications are again noted. Hepatobiliary: No masses or other significant abnormality. Pancreas: No mass, inflammatory changes, or other significant abnormality. Spleen: Within normal limits in size and appearance. Adrenals/Urinary Tract: No masses identified. No evidence of hydronephrosis. Stomach/Bowel: The appendix is not well visualized. Diverticular changes noted without evidence of diverticulitis. Mild fecal material throughout the colon is noted consistent with mild constipation. No obstructive changes are seen. Vascular/Lymphatic: Aortoiliac calcifications are noted without aneurysmal dilatation. No significant lymphadenopathy is noted. Reproductive: No acute abnormality noted. Other: No free fluid is noted. Musculoskeletal: Degenerative changes of the lumbar spine are noted. Mild scoliosis is seen. IMPRESSION: Small right-sided pleural effusion. Diffuse fibrotic changes are noted in the bases with some mild superimposed ground-glass infiltrate particularly in the right lower lobe. Chronic changes as described above. Electronically Signed   By: MInez CatalinaM.D.   On: 03/21/2016 10:19   Dg Chest Port 1 View  Result Date: 03/30/2016 CLINICAL DATA:  Elevated white blood cell count EXAM: PORTABLE CHEST 1 VIEW COMPARISON:  CT abdomen pelvis of 03/28/2016, and chest  x-ray of 03/27/2016 FINDINGS: Lungs are less well aerated with volume loss at the bases. Also there appears to be a small left pleural effusion with airspace disease in the right mid lung and to lesser degree left mid lung as well. Mild cardiomegaly is stable. Heavily calcified mitral annulus is noted. Median sternotomy sutures are present IMPRESSION: Diminished aeration with airspace disease in both mid lungs and probable left pleural effusion. Electronically Signed   By: PIvar DrapeM.D.   On: 03/30/2016 08:08   Dg Chest Port 1 View  Result Date: 03/21/2016 CLINICAL DATA:  Shortness of breath.  History of breast carcinoma EXAM: PORTABLE CHEST 1 VIEW COMPARISON:  February 16, 2016 chest radiograph and chest CT November 10, 2015 FINDINGS: There is widespread interstitial edema with patchy alveolar opacity throughout the left mid lung region as well as much the right lung. Heart is borderline enlarged with pulmonary venous hypertension. There is calcification of the mitral annulus. Patient is status post median sternotomy. Adenopathy noted on previous chest CT is less well appreciated by radiography. No bone lesions are evident. Patient is status post aortic valve replacement. Atherosclerotic calcification is noted in the aorta. IMPRESSION: Findings felt to be most consistent with a degree of congestive heart failure, apparently superimposed on chronic interstitial disease. A degree of superimposed pneumonia cannot be excluded radiographically. More than one of these entities may exist concurrently. Stable cardiac silhouette. Aortic atherosclerosis. Electronically Signed   By: WLowella GripIII M.D.   On: 03/21/2016 07:10       Subjective: Patient still moaning in discomfort. Requiring facemask and appears congested.  Discharge Exam: Vitals:   04/01/16 2034  04/02/16 0454  BP: (!) 96/53 (!) 122/54  Pulse: 90 (!) 106  Resp: 16 18  Temp: 98.2 F (36.8 C) 99.8 F (37.7 C)   Vitals:   04/01/16 0553  04/01/16 1418 04/01/16 2034 04/02/16 0454  BP: 125/61 (!) 94/46 (!) 96/53 (!) 122/54  Pulse: 90 86 90 (!) 106  Resp: 18 (!) 22 16 18   Temp: 97.4 F (36.3 C) 97.4 F (36.3 C) 98.2 F (36.8 C) 99.8 F (37.7 C)  TempSrc: Oral Oral Axillary Tympanic  SpO2: 95% 96% 100% 100%  Weight: 62.7 kg (138 lb 4.8 oz)   61.6 kg (135 lb 12.9 oz)  Height:         Gen: moaning in discomfort HEENT moist mucosa, supple neck Chest: bibasilar crackles CVS: N S1&S2, no murmurs,  GI: soft, ND, bowel sounds present, lower abdominal tenderness, Musculoskeletal: warm, trace edema CNS: AAOX1-, non focal    The results of significant diagnostics from this hospitalization (including imaging, microbiology, ancillary and laboratory) are listed below for reference.     Microbiology: Recent Results (from the past 240 hour(s))  Culture, blood (routine x 2)     Status: None (Preliminary result)   Collection Time: 03/30/16 10:24 AM  Result Value Ref Range Status   Specimen Description BLOOD LEFT ARM  Final   Special Requests IN PEDIATRIC BOTTLE [place]  Final   Culture   Final    NO GROWTH 2 DAYS Performed at [facility]    Report Status PENDING  Incomplete  Culture, blood (routine x 2)     Status: None (Preliminary result)   Collection Time: 03/30/16 10:24 AM  Result Value Ref Range Status   Specimen Description BLOOD LEFT ARM  Final   Special Requests IN PEDIATRIC BOTTLE [place]  Final   Culture   Final    NO GROWTH 2 DAYS Performed at [facility]    Report Status PENDING  Incomplete  Culture, Urine     Status: Abnormal   Collection Time: 03/30/16  1:10 PM  Result Value Ref Range Status   Specimen Description URINE, CLEAN CATCH  Final   Special Requests NONE  Final   Culture (A)  Final    >=100,000 COLONIES/mL DIPHTHEROIDS(CORYNEBACTERIUM SPECIES) Standardized susceptibility testing for this organism is not available. Performed at [facility]    Report Status 03/31/2016  FINAL  Final     Labs: BNP (last 3 results)  Recent Labs  03/21/16 0528 03/28/16 1308  BNP 585.5* 008.6*   Basic Metabolic Panel:  Recent Labs Lab 03/28/16 1307 03/29/16 0550 03/30/16 0546 03/30/16 1903 03/31/16 0523 04/01/16 0350  NA 137 135 134*  --  133* 136  K 4.1 3.4* 4.2  --  3.8 3.5  CL 98* 94* 96*  --  96* 99*  CO2 32 31 33*  --  28 31  GLUCOSE 229* 191* 94  --  189* 123*  BUN 17 15 24*  --  36* 35*  CREATININE 0.95 0.65 1.23*  --  1.31* 1.24*  CALCIUM 8.5* 8.5* 7.9*  --  7.7* 7.7*  MG  --  1.6*  --  2.1  --   --    Liver Function Tests:  Recent Labs Lab 03/28/16 1307 03/29/16 0550 03/30/16 0546 03/31/16 0523 04/01/16 0350  AST 82* 57* 46* 47* 34  ALT 301* 235* 140* 112* 74*  ALKPHOS 117 103 79 109 100  BILITOT 1.3* 1.4* 1.7* 1.5* 1.5*  PROT 7.3 6.6 6.1* 6.5  6.1*  ALBUMIN 2.8* 2.5* 2.3* 2.3* 1.9*   No results for input(s): LIPASE, AMYLASE in the last 168 hours. No results for input(s): AMMONIA in the last 168 hours. CBC:  Recent Labs Lab 03/28/16 1307 03/29/16 0550 03/30/16 0546 03/31/16 0523 04/01/16 0350  WBC 10.9* 15.9* 23.2* 18.8* 15.2*  NEUTROABS 8.7* 13.8*  --   --   --   HGB 10.8* 11.4* 9.7* 9.7* 8.6*  HCT 34.4* 35.6* 30.5* 30.9* 27.8*  MCV 94.8 92.5 93.0 93.6 91.4  PLT 143* 76* 111* 131* 133*   Cardiac Enzymes:  Recent Labs Lab 03/27/16 1835 03/28/16 0059 03/28/16 0557 03/28/16 1307  TROPONINI 1.20* 1.34* 1.04* 1.27*   BNP: Invalid input(s): POCBNP CBG:  Recent Labs Lab 04/01/16 0752 04/01/16 1211 04/01/16 1633 04/01/16 2031 04/02/16 0759  GLUCAP 106* 96 89 141* 98   D-Dimer No results for input(s): DDIMER in the last 72 hours. Hgb A1c No results for input(s): HGBA1C in the last 72 hours. Lipid Profile No results for input(s): CHOL, HDL, LDLCALC, TRIG, CHOLHDL, LDLDIRECT in the last 72 hours. Thyroid function studies No results for input(s): TSH, T4TOTAL, T3FREE, THYROIDAB in the last 72 hours.  Invalid  input(s): FREET3 Anemia work up No results for input(s): VITAMINB12, FOLATE, FERRITIN, TIBC, IRON, RETICCTPCT in the last 72 hours. Urinalysis    Component Value Date/Time   COLORURINE AMBER (A) 03/30/2016 1310   APPEARANCEUR TURBID (A) 03/30/2016 1310   LABSPEC 1.022 03/30/2016 1310   PHURINE 5.5 03/30/2016 1310   GLUCOSEU NEGATIVE 03/30/2016 1310   HGBUR LARGE (A) 03/30/2016 1310   BILIRUBINUR SMALL (A) 03/30/2016 1310   KETONESUR NEGATIVE 03/30/2016 1310   PROTEINUR NEGATIVE 03/30/2016 1310   NITRITE NEGATIVE 03/30/2016 1310   LEUKOCYTESUR LARGE (A) 03/30/2016 1310   Sepsis Labs Invalid input(s): PROCALCITONIN,  WBC,  LACTICIDVEN Microbiology Recent Results (from the past 240 hour(s))  Culture, blood (routine x 2)     Status: None (Preliminary result)   Collection Time: 03/30/16 10:24 AM  Result Value Ref Range Status   Specimen Description BLOOD LEFT ARM  Final   Special Requests IN PEDIATRIC BOTTLE [place]  Final   Culture   Final    NO GROWTH 2 DAYS Performed at [facility]    Report Status PENDING  Incomplete  Culture, blood (routine x 2)     Status: None (Preliminary result)   Collection Time: 03/30/16 10:24 AM  Result Value Ref Range Status   Specimen Description BLOOD LEFT ARM  Final   Special Requests IN PEDIATRIC BOTTLE [place]  Final   Culture   Final    NO GROWTH 2 DAYS Performed at [facility]    Report Status PENDING  Incomplete  Culture, Urine     Status: Abnormal   Collection Time: 03/30/16  1:10 PM  Result Value Ref Range Status   Specimen Description URINE, CLEAN CATCH  Final   Special Requests NONE  Final   Culture (A)  Final    >=100,000 COLONIES/mL DIPHTHEROIDS(CORYNEBACTERIUM SPECIES) Standardized susceptibility testing for this organism is not available. Performed at [facility]    Report Status 03/31/2016 FINAL  Final     Time coordinating discharge: Over 30 minutes  SIGNED:   Louellen Molder, MD  [facility] 04/02/2016, 11:53 AM Pager   If 7PM-7AM, please contact night-coverage [email or web address] Password [email or web address]

## 2016-04-04 LAB — CULTURE, BLOOD (ROUTINE X 2)
Culture: NO GROWTH
Culture: NO GROWTH

## 2016-04-07 DEATH — deceased

## 2018-06-24 IMAGING — CT CT ABD-PELV W/ CM
2 of 8 series · 14 of 46 positions shown, 18 images · IV contrast (ISOVUE)
Comparison: None.

CLINICAL DATA: Low hemoglobin and weakness with nausea and history
of breast carcinoma

EXAM:
CT ABDOMEN AND PELVIS WITH CONTRAST
TECHNIQUE: Multidetector CT imaging of the abdomen and pelvis was performed
using the standard protocol following bolus administration of
intravenous contrast.
CONTRAST:  100mL ONG1U3-OFF IOPAMIDOL (ONG1U3-OFF) INJECTION 61%

[Series 2: abd/pel with · axial · 0.75mm/px · z∈[+1040,+1440]mm · 11 of 92 slices shown, 15 images]
[im 6/92  soft-tissue]
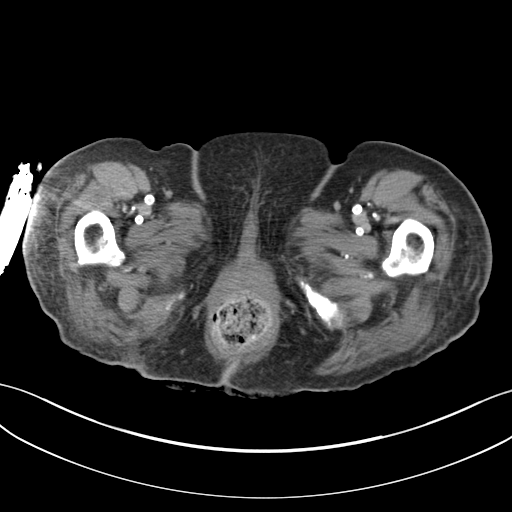
[im 6/92  bone]
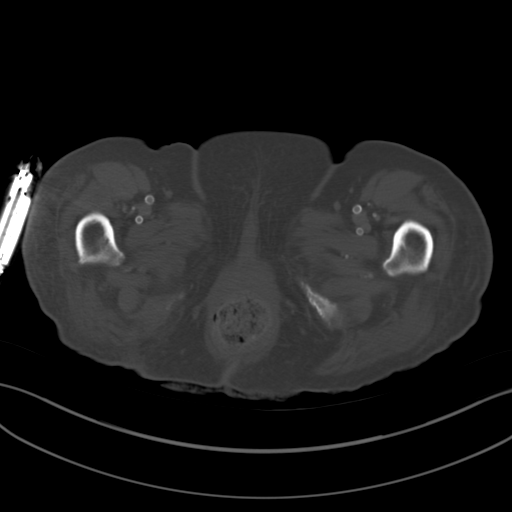
[im 17/92  soft-tissue]
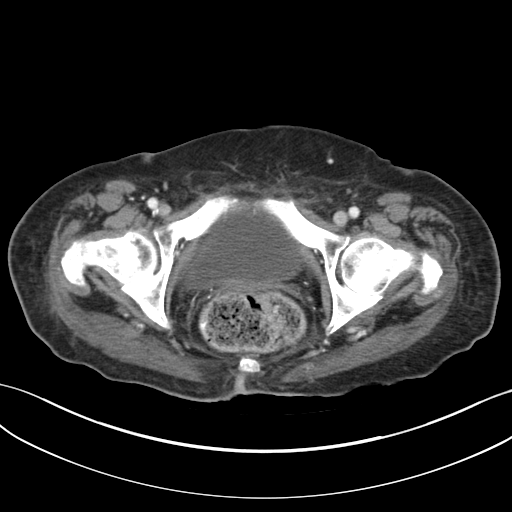
[im 27/92  soft-tissue]
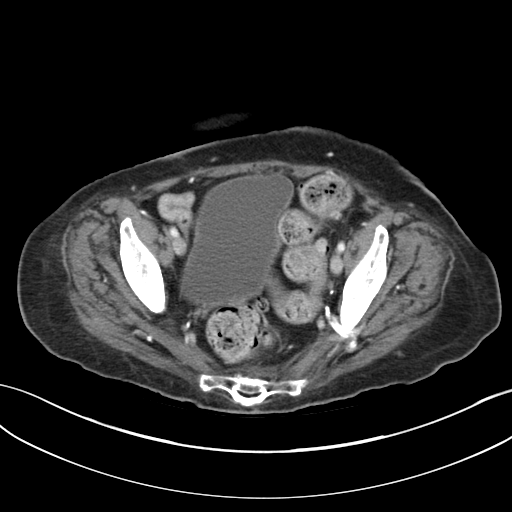
[im 38/92  soft-tissue]
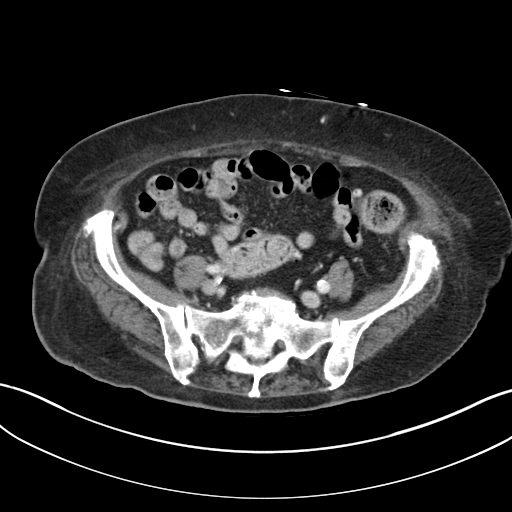
[im 49/92  soft-tissue]
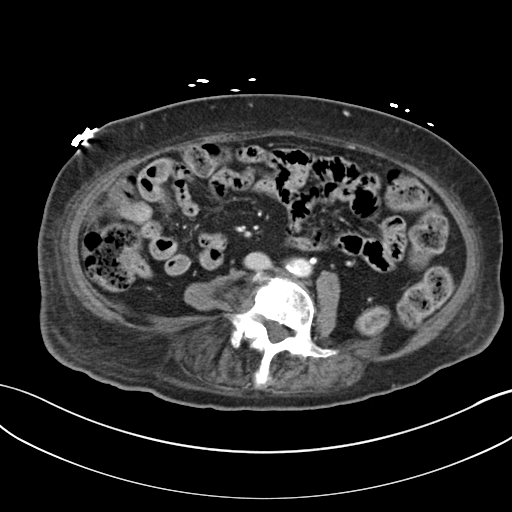
[im 54/92  soft-tissue]
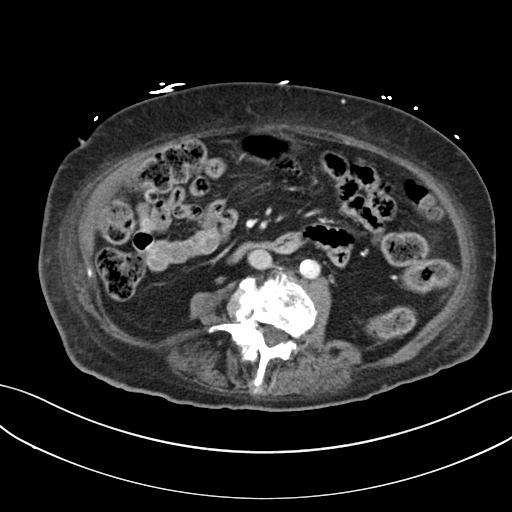
[im 65/92  soft-tissue]
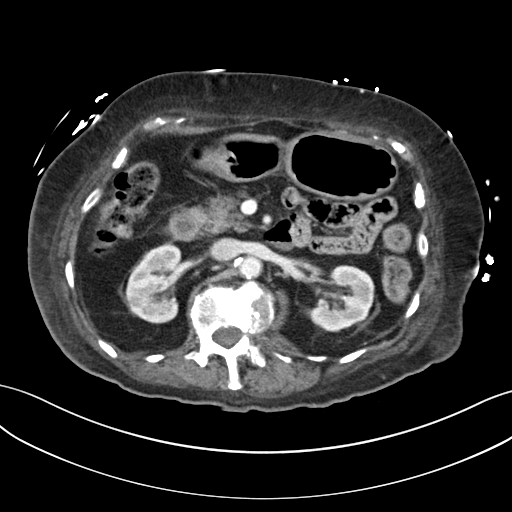
[im 70/92  lung]
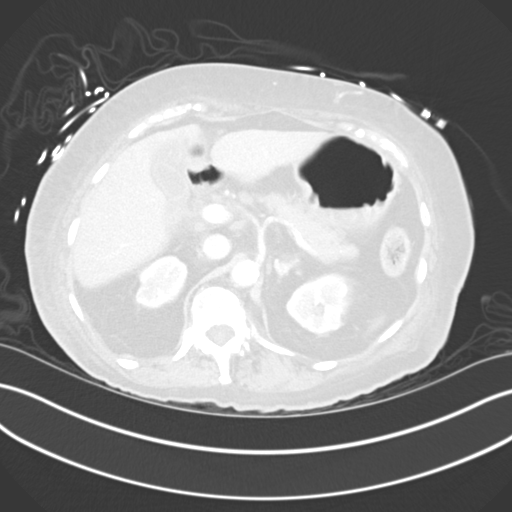
[im 75/92  soft-tissue]
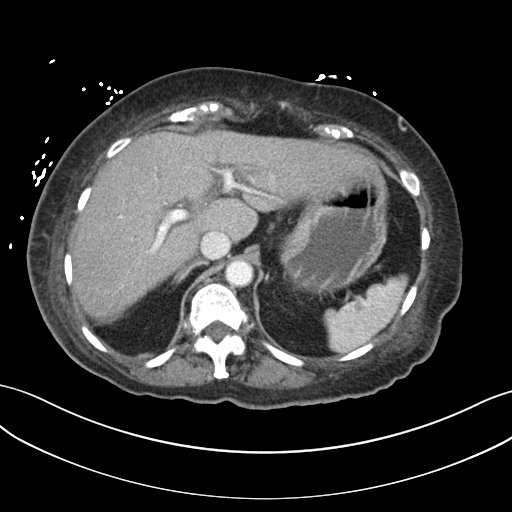
[im 75/92  lung]
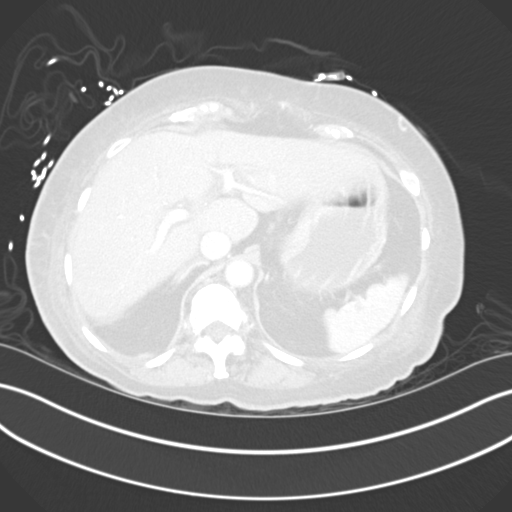
[im 81/92  lung]
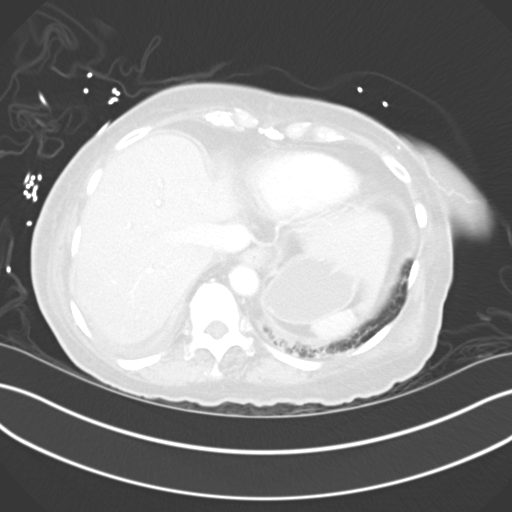
[im 86/92  soft-tissue]
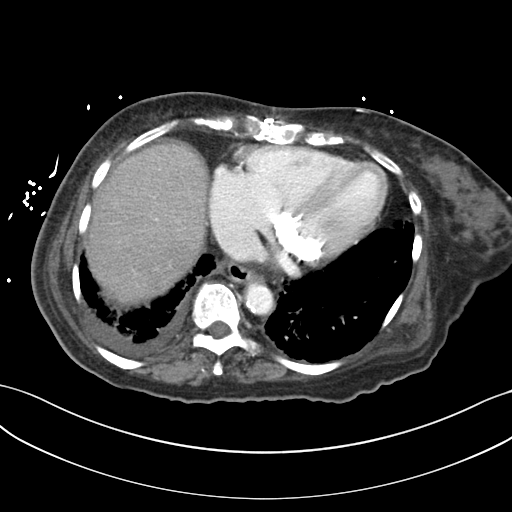
[im 86/92  lung]
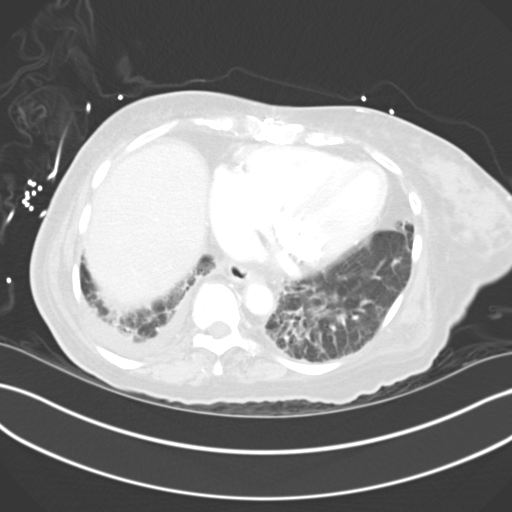
[im 86/92  bone]
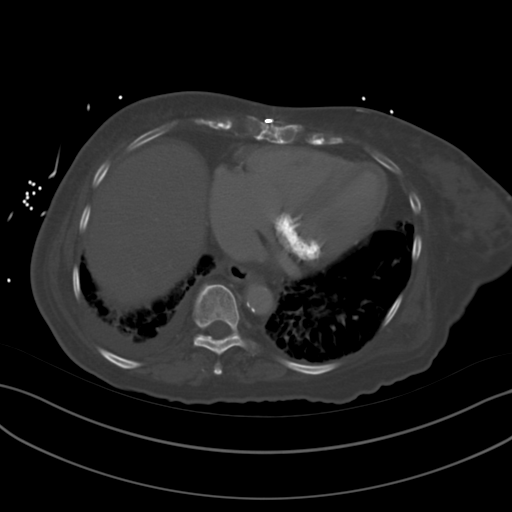

[Series 5: coronal a/|p · coronal · 0.78mm/px · 3 of 134 slices shown]
[im 34/134  soft-tissue]
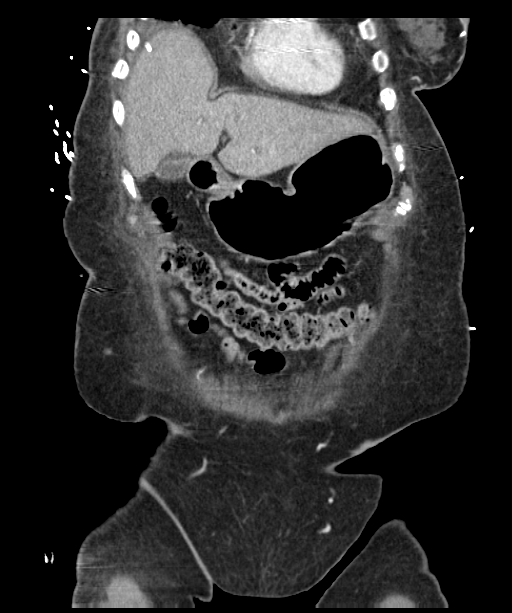
[im 67/134  soft-tissue]
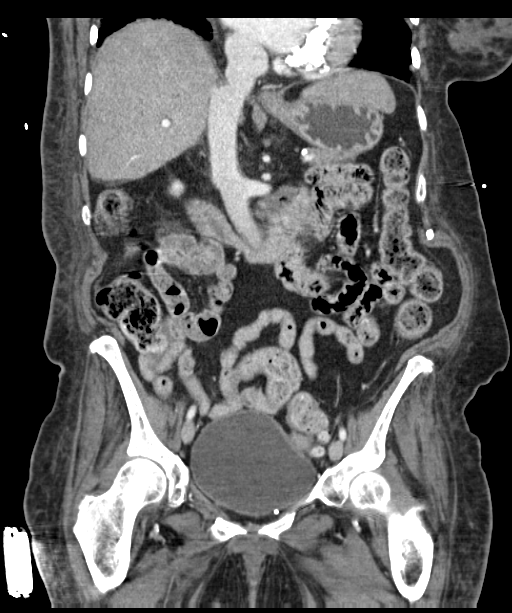
[im 100/134  soft-tissue]
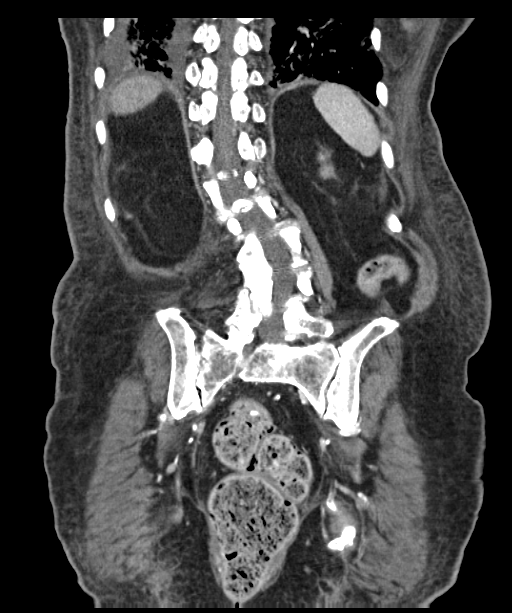

[14 of 46 positions shown; findings below may reference images not displayed]

FINDINGS: Lower chest: Small right-sided pleural effusion is noted. Diffuse
fibrotic changes are noted in the bases bilaterally. Some
superimposed ground-glass changes are noted which may represent
acute infiltrate particularly in the right lower lobe. Coronary
calcifications are again noted.

Hepatobiliary: No masses or other significant abnormality.

Pancreas: No mass, inflammatory changes, or other significant
abnormality.

Spleen: Within normal limits in size and appearance.

Adrenals/Urinary Tract: No masses identified. No evidence of
hydronephrosis.

Stomach/Bowel: The appendix is not well visualized. Diverticular
changes noted without evidence of diverticulitis. Mild fecal
material throughout the colon is noted consistent with mild
constipation. No obstructive changes are seen.

Vascular/Lymphatic: Aortoiliac calcifications are noted without
aneurysmal dilatation. No significant lymphadenopathy is noted.

Reproductive: No acute abnormality noted.

Other: No free fluid is noted.

Musculoskeletal: Degenerative changes of the lumbar spine are noted.
Mild scoliosis is seen.
IMPRESSION: Small right-sided pleural effusion. Diffuse fibrotic changes are
noted in the bases with some mild superimposed ground-glass
infiltrate particularly in the right lower lobe.

Chronic changes as described above.
